# Patient Record
Sex: Male | Born: 1980 | Race: Asian | Hispanic: No | Marital: Single | Smoking: Never smoker
Health system: Southern US, Community
[De-identification: ages and names within clinical notes are randomized; demographics above are authoritative.]

## PROBLEM LIST (undated history)

## (undated) DIAGNOSIS — G629 Polyneuropathy, unspecified: Secondary | ICD-10-CM

## (undated) DIAGNOSIS — T7840XA Allergy, unspecified, initial encounter: Secondary | ICD-10-CM

## (undated) HISTORY — DX: Polyneuropathy, unspecified: G62.9

## (undated) HISTORY — DX: Allergy, unspecified, initial encounter: T78.40XA

---

## 2008-04-26 ENCOUNTER — Encounter: Payer: Self-pay | Admitting: Internal Medicine

## 2008-05-21 ENCOUNTER — Encounter: Payer: Self-pay | Admitting: Internal Medicine

## 2008-11-29 ENCOUNTER — Encounter: Payer: Self-pay | Admitting: Internal Medicine

## 2009-06-05 ENCOUNTER — Encounter: Payer: Self-pay | Admitting: Internal Medicine

## 2009-10-01 ENCOUNTER — Encounter: Payer: Self-pay | Admitting: Internal Medicine

## 2009-10-20 ENCOUNTER — Encounter: Payer: Self-pay | Admitting: Internal Medicine

## 2009-10-31 ENCOUNTER — Encounter: Payer: Self-pay | Admitting: Internal Medicine

## 2009-11-20 ENCOUNTER — Encounter: Payer: Self-pay | Admitting: Internal Medicine

## 2009-12-02 ENCOUNTER — Encounter: Payer: Self-pay | Admitting: Internal Medicine

## 2009-12-23 ENCOUNTER — Encounter: Payer: Self-pay | Admitting: Internal Medicine

## 2010-02-03 ENCOUNTER — Encounter: Payer: Self-pay | Admitting: Internal Medicine

## 2010-02-04 ENCOUNTER — Encounter: Payer: Self-pay | Admitting: Internal Medicine

## 2010-03-26 ENCOUNTER — Encounter: Payer: Self-pay | Admitting: Internal Medicine

## 2010-06-30 ENCOUNTER — Ambulatory Visit: Payer: Self-pay | Admitting: Internal Medicine

## 2010-06-30 DIAGNOSIS — K219 Gastro-esophageal reflux disease without esophagitis: Secondary | ICD-10-CM | POA: Insufficient documentation

## 2010-06-30 DIAGNOSIS — R059 Cough, unspecified: Secondary | ICD-10-CM | POA: Insufficient documentation

## 2010-06-30 DIAGNOSIS — R05 Cough: Secondary | ICD-10-CM

## 2010-07-21 ENCOUNTER — Telehealth: Payer: Self-pay | Admitting: Internal Medicine

## 2010-07-22 ENCOUNTER — Telehealth: Payer: Self-pay | Admitting: Internal Medicine

## 2010-07-22 ENCOUNTER — Ambulatory Visit (HOSPITAL_COMMUNITY): Admission: RE | Admit: 2010-07-22 | Payer: Self-pay | Source: Home / Self Care | Admitting: Internal Medicine

## 2010-08-13 ENCOUNTER — Ambulatory Visit: Admit: 2010-08-13 | Payer: Self-pay | Admitting: Internal Medicine

## 2010-08-25 NOTE — Letter (Signed)
Summary: [facility]  [facility]   Imported By: Lennie Odor 07/02/2010 11:06:59  _____________________________________________________________________  External Attachment:    Type:   Image     Comment:   External Document

## 2010-08-25 NOTE — Assessment & Plan Note (Signed)
Summary: PER DR MANN/CHRONIC COUGH/MH   Visit Type:  Initial Consult Copy to:  Dr. Charna Elizabeth Primary Provider/Referring Provider:  Dr. Charna Elizabeth  CC:  Pulmonary consult cough on and off x years..  History of Present Illness: 30 year old Indian-American man. Chronic cough. Severe. Dry. On and off since age 79 with a 4-5 year break in between. Stable all along but past 18 months or so progressively worse. Day time cough only. No nocturnal awakenings and family reports no cough at night. Cough triggered by smells, cooking smell, grease, frying food, cooking smoke and fluctuations in temeperature that happens quickly. Associated with cough is a mild cough that happens immediately after severe bouts of cough. This lasts a few seconds after cough.   Associated with cough is a feeling that throat gets dry afer talking 5 minutes. Needs to drink water to get relief. He feels throat gets red and closing in when he feels dry. Also happens when he coughs a lot.  Uses chloraseptic spray to numb throat in back and needs to sip a lot of water.  He also feels he has to clear throat all the time after talking or after coughing. Does admit to sinus drainage. Has seen Dr. Lucie Leather and DR Pollyann Kennedy for it. Diagnosed with ethmoid sinus and post nasal drainage. Given nasal inhaler nos 8 months ago but did not help. Denies associated dyspnea, hemoptysis, fever, chills, hypertension. Recollects normal spirometry at [facility] in Jan 2011. REcollects normal CXR at Dr. Pollyann Kennedy office this year  Rx for cough includes dulera past 3 months given by Dr. Lucie Leather but not helping. PRior to that was on ventolin ("yellow pump") as needed but did not help. REcollecs singulair trial for one month in summer2011 that helped "allergies" (pollen, trees, dust)  but not cough. Also recollects prilosc trial in 2010 for 6 weeks but did not help. Dr. Loreta Ave placed him on prevacid for a few weeks in fall 2011 but did not help. DR Loreta Ave also advised anti-gerd  diet which he states he is following (avoiding rice, white bread splenda, spices, oily food) and this has helped belching and diarrhea but not cough  Preventive Screening-Counseling & Management  Alcohol-Tobacco     Smoking Status: never  Current Medications (verified): 1)  Wal-Tussin 100 Mg/62ml Syrp (Guaifenesin) .... As Needed 2)  Theraflu Flu/chest Congestion 1000-400 Mg Pack (Acetaminophen-Guaifenesin) .... As Directed  Allergies (verified): No Known Drug Allergies  Past History:  Past Medical History: G E R D Allergy Skin Testing - april/may 2011  - skin testingpositve for pollen, grass, tress, pet, dus mite Sinusitis Chronic belching and diarrhea  Past Surgical History: none  Family History: Sister-Allergies (pet, weeds, grass, pollen, trees)   Social History: Patient never smoked.  Single Customer Service  Lives in [place] in [place] Migrated to [place] at age 72. Lives with mom and dad Graduate of business and computer scienceSmoking Status:  never  Review of Systems       The patient complains of non-productive cough, sore throat, and sneezing.  The patient denies shortness of breath with activity, shortness of breath at rest, productive cough, coughing up blood, chest pain, irregular heartbeats, acid heartburn, indigestion, loss of appetite, weight change, abdominal pain, difficulty swallowing, tooth/dental problems, headaches, nasal congestion/difficulty breathing through nose, itching, ear ache, anxiety, depression, hand/feet swelling, joint stiffness or pain, rash, change in color of mucus, and fever.    Vital Signs:  Patient profile:   30 year old male Height:  68 inches Weight:      161.13 pounds BMI:     24.59 O2 Sat:      98 % on Room air Temp:     97.6 degrees F oral Pulse rate:   85 / minute BP sitting:   110 / 78  (right arm) Cuff size:   regular  Vitals Entered By: Carron Curie CMA (June 30, 2010 3:38 PM)  O2 Flow:  Room  air CC: Pulmonary consult cough on and off x years. Comments Medications reviewed with patient Carron Curie CMA  June 30, 2010 3:41 PM Daytime phone number verified with patient.    Physical Exam  General:  periodic laryngal cough BARKING qUALITY THROAT CLEARING End EXP stridor after cough Head:  normocephalic and atraumatic Eyes:  PERRLA/EOM intact; conjunctiva and sclera clear Ears:  TMs intact and clear with normal canals Nose:  no deformity, discharge, inflammation, or lesions Mouth:  no deformity or lesions Neck:  no masses, thyromegaly, or abnormal cervical nodes Chest Wall:  no deformities noted Lungs:  clear bilaterally to auscultation and percussion Heart:  regular rate and rhythm, S1, S2 without murmurs, rubs, gallops, or clicks Abdomen:  bowel sounds positive; abdomen soft and non-tender without masses, or organomegaly Msk:  no deformity or scoliosis noted with normal posture Pulses:  pulses normal Extremities:  no clubbing, cyanosis, edema, or deformity noted Neurologic:  CN II-XII grossly intact with normal reflexes, coordination, muscle strength and tone Skin:  intact without lesions or rashes Cervical Nodes:  no significant adenopathy Axillary Nodes:  no significant adenopathy Psych:  alert and cooperative; normal mood and affect; normal attention span and concentration   Impression & Recommendations:  Problem # 1:  COUGH (ICD-786.2) Assessment New Chronic. Has failed anti-GERD and anti asthma, eosinophilic bronchitis Rx. Has hx of GERD and post nasa drainage as triggers. Cough itself appears chronic, cylical/habit cough that initially was broughon by sinus drainae/gerd but now has taken a life of its own. Many features of his cough fit in with that  plan get old spiro from [facility] get imaging results from Dr. 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 As needed 2)  Theraflu Flu/chest Congestion 1000-400 Mg Pack (Acetaminophen-guaifenesin) .... As directed  Patient Instructions: 1)  stop dulera and singulair if you are taking that too for 2-3 weeks 2)  have methacholine challenge test in 2-3 weeks 3)  use netti pot saline wash daily 4)  control acid reflux with diet - take diet sheet 5)  return after methacholine challenge test 6)  sign release to get spiro from [facility] and imaging from Dr Elray Buba

## 2010-08-25 NOTE — Letter (Signed)
Summary: [facility]  [facility]   Imported By: Lennie Odor 07/02/2010 11:09:16  _____________________________________________________________________  External Attachment:    Type:   Image     Comment:   External Document

## 2010-08-25 NOTE — Letter (Signed)
Summary: [facility]  [facility]   Imported By: Lennie Odor 07/02/2010 11:08:25  _____________________________________________________________________  External Attachment:    Type:   Image     Comment:   External Document

## 2010-08-27 NOTE — Letter (Signed)
Summary: [facility]   Imported By: Sherian Rein 07/23/2010 08:07:45  _____________________________________________________________________  External Attachment:    Type:   Image     Comment:   External Document

## 2010-08-27 NOTE — Letter (Signed)
Summary: [facility]   Imported By: Sherian Rein 07/23/2010 08:09:25  _____________________________________________________________________  External Attachment:    Type:   Image     Comment:   External Document

## 2010-08-27 NOTE — Letter (Signed)
Summary: [facility]   Imported By: Sherian Rein 07/23/2010 08:08:30  _____________________________________________________________________  External Attachment:    Type:   Image     Comment:   External Document

## 2010-08-27 NOTE — Letter (Signed)
Summary: [facility]   Imported By: Sherian Rein 07/23/2010 08:06:36  _____________________________________________________________________  External Attachment:    Type:   Image     Comment:   External Document

## 2010-08-27 NOTE — Progress Notes (Signed)
Summary: test tomorrow at [facility]  Phone Note Call from Patient Call back at [facility] Phone [phone]   Caller: Patient Call For: [facility] Summary of Call: Patient phoned stated that he wanted to confirm his a[[ointment at [facility] tomorrow. He also stated that he was suposed to do something for and he hasn't acutally done alot of it and wants to make sure it will not effect the out come of the test. He can be reached at [phone] Initial call taken by: Vedia Coffer,  July 21, 2010 3:56 PM  Follow-up for Phone Call        Spoke with pt and advised of appt time again. He states he has not done his netti pot as directed and wanted to know if this would effect the test. I advsied this would not effect the test.  Carron Curie CMA  July 21, 2010 4:20 PM

## 2010-08-27 NOTE — Letter (Signed)
Summary: [facility]   Imported By: Sherian Rein 07/23/2010 08:04:42  _____________________________________________________________________  External Attachment:    Type:   Image     Comment:   External Document

## 2010-08-27 NOTE — Progress Notes (Signed)
Summary: ? methacholine challenge---pt cancelled test-LMTCBx1  Phone Note Call from Patient Call back at Home Phone [phone]   Caller: Patient Call For: ramaswamy Reason for Call: Talk to Nurse Summary of Call: Patient requesting to speak to [facility] about methacholine challenge test.  Test was scheduled for today, however patient does not have insurance and cx test.  He wants to hold off rescheduling test until he speaks w/ Dr. Marchelle Gearing. Initial call taken by: Lehman Prom,  July 22, 2010 9:51 AM  Follow-up for Phone Call        Patient states he is without insurance at this time and was given an estimate of $900-$1200 for the methacholine challenge test from the hospital. He has cancelled this test d/t cost/no ins and would like to know what the next step is for treatment. Pt is aware MR is out of the office and msg will be forwarded for him to review once he is back. I will also forward to Victorino Dike so she is aware. Follow-up by: Michel Bickers CMA,  July 22, 2010 10:38 AM  Additional Follow-up for Phone Call Additional follow up Details #1::        lease give him fu appt. I will consider cyclical cough protocol and empiric neurontin Additional Follow-up by: Kalman Shan MD,  July 23, 2010 9:30 AM    Additional Follow-up for Phone Call Additional follow up Details #2::    LMTCBx1 to set appt.Carron Curie CMA  July 23, 2010 9:48 AM  Appointment scheduled Aug 13, 2010 @ 2:50pm. Zackery Barefoot CMA  July 23, 2010 10:33 AM

## 2010-08-27 NOTE — Letter (Signed)
Summary: [facility]  [facility]   Imported By: Sherian Rein 07/23/2010 08:03:24  _____________________________________________________________________  External Attachment:    Type:   Image     Comment:   External Document

## 2010-08-27 NOTE — Letter (Signed)
Summary: [facility]  [facility]   Imported By: Sherian Rein 07/23/2010 08:02:13  _____________________________________________________________________  External Attachment:    Type:   Image     Comment:   External Document

## 2010-08-27 NOTE — Letter (Signed)
Summary: [facility]   Imported By: Sherian Rein 07/23/2010 08:05:39  _____________________________________________________________________  External Attachment:    Type:   Image     Comment:   External Document

## 2011-11-29 ENCOUNTER — Ambulatory Visit (INDEPENDENT_AMBULATORY_CARE_PROVIDER_SITE_OTHER): Payer: Managed Care, Other (non HMO) | Admitting: Family Medicine

## 2011-11-29 ENCOUNTER — Ambulatory Visit: Payer: Managed Care, Other (non HMO)

## 2011-11-29 VITALS — BP 104/68 | HR 69 | Temp 98.0°F | Resp 16 | Ht 67.58 in | Wt 163.4 lb

## 2011-11-29 DIAGNOSIS — R059 Cough, unspecified: Secondary | ICD-10-CM

## 2011-11-29 DIAGNOSIS — J309 Allergic rhinitis, unspecified: Secondary | ICD-10-CM

## 2011-11-29 DIAGNOSIS — R05 Cough: Secondary | ICD-10-CM

## 2011-11-29 LAB — POCT CBC
Lymph, poc: 2.2 (ref 0.6–3.4)
MCHC: 33.3 g/dL (ref 31.8–35.4)
MPV: 10.7 fL (ref 0–99.8)
POC Granulocyte: 6.2 (ref 2–6.9)
POC LYMPH PERCENT: 23.9 %L (ref 10–50)
POC MID %: 7.7 %M (ref 0–12)
RDW, POC: 12.5 %

## 2011-11-29 MED ORDER — FLUTICASONE PROPIONATE 50 MCG/ACT NA SUSP: 2.0000 | Freq: Every day | NASAL | Status: DC

## 2011-11-29 MED ORDER — MONTELUKAST SODIUM 10 MG PO TABS: 10.0000 mg | ORAL_TABLET | Freq: Every day | ORAL | Status: DC

## 2011-11-29 NOTE — Progress Notes (Signed)
Patient Name: Joe Ray Date of Birth: 02-20-1981 Medical Record Number: [id number] Gender: male Date of Encounter: 11/29/2011  History of Present Illness:  Joe Ray is a 31 y.o. very pleasant male patient who presents with the following:  Here with a cough for 6 or 7 weeks which he attributed to allergies.  He then developed aches and fatigue about 2 weeks ago. No fever.  He went to another UC and was treated with Avelox and flovent about 6 days ago.  He seemed to be feeling better, but then the symptoms returned over the last couple of days.  He feels tired and run- down  He does have a ST off an on.  Also has headaches, body aches.  Runny and stuffy nose on and off, some sneezing.   The avelox caused diarrhea and nausea, but no vomiting.  He tends to have GI distress and diarrhea with most antibiotics  Patient Active Problem List  Diagnoses  . G E R D  . COUGH   No past medical history on file. No past surgical history on file. History  Substance Use Topics  . Smoking status: Never Smoker   . Smokeless tobacco: Not on file  . Alcohol Use: Not on file   No family history on file. Allergies  Allergen Reactions  . Amoxicillin Diarrhea and Rash    Medication list has been reviewed and updated.  Review of Systems: As per HPI- otherwise negative.   Physical Examination: Filed Vitals:   11/29/11 1510  BP: 104/68  Pulse: 69  Temp: 98 F (36.7 C)  TempSrc: Oral  Resp: 16  Height: 5' 7.58" (1.717 m)  Weight: 163 lb 6.4 oz (74.118 kg)    Body mass index is 25.15 kg/(m^2).  GEN: WDWN, NAD, Non-toxic, A & O x 3 HEENT: Atraumatic, Normocephalic. Neck supple. No masses, No LAD.  Tm, oropharynx wnl, nasal cavity wnl Ears and Nose: No external deformity. CV: RRR, No M/G/R. No JVD. No thrill. No extra heart sounds. PULM: CTA B, no wheezes, crackles, rhonchi. No retractions. No resp. distress. No accessory muscle use. ABD: S, NT, ND, +BS. No rebound. No HSM. EXTR: No  c/c/e NEURO Normal gait.  PSYCH: Normally interactive. Conversant. Not depressed or anxious appearing.  Calm demeanor.   [facility] reading (PRIMARY) by  Dr. Patsy Lager.  No infiltrate, but increased markings/ vascularity bilateral lungs  CHEST - 2 VIEW  Comparison: None.  Findings: The heart size is normal. Mild interstitial coarsening is evident. No focal airspace disease is present. Bifid fourth ribs are present bilaterally. The visualized soft tissues and bony thorax are otherwise unremarkable.  IMPRESSION:  1. No acute cardiopulmonary disease. 2. Mild interstitial coarsening is likely chronic.   Results for orders placed in visit on 11/29/11  POCT CBC      Component Value Range   WBC 9.0  4.6 - 10.2 (K/uL)   Lymph, poc 2.2  0.6 - 3.4    POC LYMPH PERCENT 23.9  10 - 50 (%L)   MID (cbc) 0.7  0 - 0.9    POC MID % 7.7  0 - 12 (%M)   POC Granulocyte 6.2  2 - 6.9    Granulocyte percent 68.4  37 - 80 (%G)   RBC 5.36  4.69 - 6.13 (M/uL)   Hemoglobin 15.4  14.1 - 18.1 (g/dL)   HCT, POC 40.9  81.1 - 53.7 (%)   MCV 86.2  80 - 97 (fL)   MCH, POC 28.7  27 - 31.2 (pg)   MCHC 33.3  31.8 - 35.4 (g/dL)   RDW, POC 16.1     Platelet Count, POC 302  142 - 424 (K/uL)   MPV 10.7  0 - 99.8 (fL)    Assessment and Plan: 1. Cough  POCT CBC, DG Chest 2 View  2. Allergic rhinitis  fluticasone (FLONASE) 50 MCG/ACT nasal spray, montelukast (SINGULAIR) 10 MG tablet   Joe Ray has had a cough and other allergy symptoms for some time.  Will add singulair (which he has used before) and flonase to his regimen.  He also wonders why he feels so tired lately.  Offered to do other labs such as TSH, CMP- however he prefers to see how he does with the steps above and will call me if he does not feel better. He will try and finish his last day of avelox, but will add kefir to help combat diarrhea.

## 2012-09-11 ENCOUNTER — Ambulatory Visit (INDEPENDENT_AMBULATORY_CARE_PROVIDER_SITE_OTHER): Payer: Managed Care, Other (non HMO)

## 2012-09-14 ENCOUNTER — Ambulatory Visit (INDEPENDENT_AMBULATORY_CARE_PROVIDER_SITE_OTHER): Payer: Managed Care, Other (non HMO) | Admitting: Family Medicine

## 2012-09-14 VITALS — BP 117/83 | HR 75 | Temp 97.9°F | Resp 16 | Ht 67.0 in | Wt 158.0 lb

## 2012-09-14 DIAGNOSIS — J45909 Unspecified asthma, uncomplicated: Secondary | ICD-10-CM

## 2012-09-14 DIAGNOSIS — R490 Dysphonia: Secondary | ICD-10-CM

## 2012-09-14 DIAGNOSIS — J309 Allergic rhinitis, unspecified: Secondary | ICD-10-CM

## 2012-09-14 MED ORDER — BECLOMETHASONE DIPROPIONATE 40 MCG/ACT IN AERS: 2.0000 | INHALATION_SPRAY | Freq: Two times a day (BID) | RESPIRATORY_TRACT | Status: DC

## 2012-09-14 MED ORDER — FLUTICASONE PROPIONATE 50 MCG/ACT NA SUSP: 2.0000 | Freq: Every day | NASAL | Status: DC

## 2012-09-14 NOTE — Patient Instructions (Addendum)
Start back on your flonase nasal spray.  Also, we are going to start you on qvar for your asthma- inhale one puff twice a day.  Let me know if you are not better in the next week or so.  I am also going to refer you to ENT for further evaluation

## 2012-09-14 NOTE — Progress Notes (Signed)
[facility] [address] [place] [address] [phone]  Date:  09/14/2012   Name:  Joe Ray   DOB:  08-Nov-1980   MRN:  [id number]  PCP:  No primary provider on file.    Chief Complaint: Cough and Sore Throat   History of Present Illness:  Joe Ray is a 32 y.o. very pleasant male patient who presents with the following:  He is here today with a lingering throat issues and cough.  He notes that his throat can feel tight or scratchy off an on, and he will have some bad cough attacks.  He also continues to have asthma symptoms and will wheeze after his throat feels worse.    He does note a hoarse voice which comes and goes.   He has some sneezing and runny nose as well No fever, chills or aches The cough is non- productive.   He uses albututerol prn, singunair and clarinex.   He has been using tylenol sinus OTC  He is using his albuterol nearly every day and is not on a controlled medication at this time  See note from Dr. Phylliss Bob (pulmonary) from 2011  He has had allergy testing in the past- he has general environmental allergies including leaves, pollen, grass, dog hair.  He does not want to do allergy shots at this time  Patient Active Problem List  Diagnosis  . Eugenie Filler COUGH    Past Medical History  Diagnosis Date  . Allergy     History reviewed. No pertinent past surgical history.  History  Substance Use Topics  . Smoking status: Never Smoker   . Smokeless tobacco: Not on file  . Alcohol Use: Not on file    Family History  Problem Relation Age of Onset  . Diabetes Father     Allergies  Allergen Reactions  . Amoxicillin Diarrhea and Rash    Medication list has been reviewed and updated.  Current Outpatient Prescriptions on File Prior to Visit  Medication Sig Dispense Refill  . Albuterol (PROVENTIL IN) Inhale 1 puff into the lungs as needed.      . montelukast (SINGULAIR) 10 MG tablet Take 1 tablet (10 mg total) by  mouth at bedtime.  30 tablet  3  . Phenyleph-Doxylamine-DM-APAP (TYLENOL COLD MULTI-SYMPTOM) 5-6.25-10-325 MG/15ML LIQD Take by mouth. Pt taking tylenol cold multi-symptom liquid qid  dose unknown      . fluticasone (FLONASE) 50 MCG/ACT nasal spray Place 2 sprays into the nose daily.  16 g  6  . fluticasone (FLOVENT HFA) 220 MCG/ACT inhaler Inhale 1 puff into the lungs 2 (two) times daily.      Marland Kitchen moxifloxacin (AVELOX) 400 MG tablet Take 400 mg by mouth daily.       No current facility-administered medications on file prior to visit.    Review of Systems:  As per HPI- otherwise negative.   Physical Examination: Filed Vitals:   09/14/12 1628  BP: 117/83  Pulse: 75  Temp: 97.9 F (36.6 C)  Resp: 16   Filed Vitals:   09/14/12 1628  Height: 5\' 7"  (1.702 m)  Weight: 158 lb (71.668 kg)   Body mass index is 24.74 kg/(m^2). Ideal Body Weight: Weight in (lb) to have BMI = 25: 159.3  GEN: WDWN, NAD, Non-toxic, A & O x 3 HEENT: Atraumatic, Normocephalic. Neck supple. No masses, No LAD.  Bilateral TM wnl, oropharynx normal.  PEERL,EOMI.   Nasal cavity  with stringy mucus- consistent with allergies Ears and Nose: No external deformity. CV: RRR, No M/G/R. No JVD. No thrill. No extra heart sounds. PULM: CTA B, no wheezes, crackles, rhonchi. No retractions. No resp. distress. No accessory muscle use. EXTR: No c/c/e NEURO Normal gait.  PSYCH: Normally interactive. Conversant. Not depressed or anxious appearing.  Calm demeanor.    Assessment and Plan: Allergic rhinitis - Plan: fluticasone (FLONASE) 50 MCG/ACT nasal spray  Asthma in adult - Plan: beclomethasone (QVAR) 40 MCG/ACT inhaler  Hoarse voice quality - Plan: Ambulatory referral to ENT  uncontrolled asthma- will add qvar to his regimen- asked him to start with just 1 puff of the 40 BID.  Continue singulair and albuterol as needed.  Add flonase for his allergies.  He requests an ENT evaluation- will refer  Abbe Amsterdam,  MD

## 2013-09-03 ENCOUNTER — Ambulatory Visit (INDEPENDENT_AMBULATORY_CARE_PROVIDER_SITE_OTHER): Payer: Managed Care, Other (non HMO) | Admitting: Family Medicine

## 2013-09-03 VITALS — BP 110/72 | HR 85 | Temp 97.2°F | Resp 18 | Ht 68.0 in | Wt 166.0 lb

## 2013-09-03 DIAGNOSIS — R059 Cough, unspecified: Secondary | ICD-10-CM

## 2013-09-03 DIAGNOSIS — J019 Acute sinusitis, unspecified: Secondary | ICD-10-CM

## 2013-09-03 DIAGNOSIS — R509 Fever, unspecified: Secondary | ICD-10-CM

## 2013-09-03 DIAGNOSIS — R05 Cough: Secondary | ICD-10-CM

## 2013-09-03 LAB — POCT CBC
Granulocyte percent: 63.1 %G (ref 37–80)
HEMATOCRIT: 47.4 % (ref 43.5–53.7)
Hemoglobin: 14.9 g/dL (ref 14.1–18.1)
LYMPH, POC: 1.7 (ref 0.6–3.4)
MCH: 28.4 pg (ref 27–31.2)
MCHC: 31.4 g/dL — AB (ref 31.8–35.4)
MCV: 90.2 fL (ref 80–97)
MID (CBC): 0.5 (ref 0–0.9)
MPV: 11 fL (ref 0–99.8)
PLATELET COUNT, POC: 235 10*3/uL (ref 142–424)
POC Granulocyte: 3.8 (ref 2–6.9)
POC LYMPH %: 27.8 % (ref 10–50)
POC MID %: 9.1 %M (ref 0–12)
RBC: 5.25 M/uL (ref 4.69–6.13)
RDW, POC: 13.7 %
WBC: 6 10*3/uL (ref 4.6–10.2)

## 2013-09-03 LAB — POCT INFLUENZA A/B
INFLUENZA A, POC: NEGATIVE
INFLUENZA B, POC: NEGATIVE

## 2013-09-03 MED ORDER — LEVOFLOXACIN 500 MG PO TABS: 500.0000 mg | ORAL_TABLET | Freq: Every day | ORAL | Status: DC

## 2013-09-03 NOTE — Patient Instructions (Signed)
Try some mucinex, and pain relievers OTC for your sinus congestion/ sinus infection Take the levaquin antibiotic once a day for 7 days.    Let me know if you are not better soon!   Try some aquaphor ointment for your hands- use at night before bed.  For added benefit add gloves at night.

## 2013-09-03 NOTE — Progress Notes (Signed)
[facility] [address] [place] [address] [phone]  Date:  09/03/2013   Name:  Joe Ray   DOB:  09/25/1980   MRN:  [id number]  PCP:  No primary provider on file.    Chief Complaint: Nasal Congestion, Sore Throat, Chills, Cough and Headache   History of Present Illness:  Joe Ray is a 33 y.o. very pleasant male patient who presents with the following:  He has been ill for 4 or 5 days with cough, runny nose, body aches, chills, fatigue.  He has not measured a fever at home.  He feels pressure in the left side of his sinuses.   He has taken ibuprofen, theraflu today.   No GI symptoms.   He has been able to drive, but has been in bed for the last few days.  His sx seem to be getting worse.  He is "miserable" and the worst sx is the pain in his sinuses  He is otherwise generally healthy except for GERD  Patient Active Problem List   Diagnosis Date Noted  . G E R D 06/30/2010  . COUGH 06/30/2010    Past Medical History  Diagnosis Date  . Allergy     History reviewed. No pertinent past surgical history.  History  Substance Use Topics  . Smoking status: Never Smoker   . Smokeless tobacco: Not on file  . Alcohol Use: Not on file    Family History  Problem Relation Age of Onset  . Diabetes Father     Allergies  Allergen Reactions  . Amoxicillin Diarrhea and Rash    Medication list has been reviewed and updated.  Current Outpatient Prescriptions on File Prior to Visit  Medication Sig Dispense Refill  . Iron-Vit C-Vit B12-Folic Acid (IRON 100 PLUS PO) Take by mouth.      . moxifloxacin (AVELOX) 400 MG tablet Take 400 mg by mouth daily.      Marland Kitchen Phenyleph-Doxylamine-DM-APAP (TYLENOL COLD MULTI-SYMPTOM) 5-6.25-10-325 MG/15ML LIQD Take by mouth. Pt taking tylenol cold multi-symptom liquid qid  dose unknown      . Albuterol (PROVENTIL IN) Inhale 1 puff into the lungs as needed.      . beclomethasone (QVAR) 40 MCG/ACT inhaler Inhale 2  puffs into the lungs 2 (two) times daily.  1 Inhaler  12  . fluticasone (FLONASE) 50 MCG/ACT nasal spray Place 2 sprays into the nose daily.  16 g  6  . montelukast (SINGULAIR) 10 MG tablet Take 1 tablet (10 mg total) by mouth at bedtime.  30 tablet  3   No current facility-administered medications on file prior to visit.    Review of Systems:  As per HPI- otherwise negative.   Physical Examination: Filed Vitals:   09/03/13 1514  BP: 110/72  Pulse: 85  Temp: 97.2 F (36.2 C)  Resp: 18   Filed Vitals:   09/03/13 1514  Height: 5\' 8"  (1.727 m)  Weight: 166 lb (75.297 kg)   Body mass index is 25.25 kg/(m^2). Ideal Body Weight: Weight in (lb) to have BMI = 25: 164.1  GEN: WDWN, NAD, Non-toxic, A & O x 3 HEENT: Atraumatic, Normocephalic. Neck supple. No masses, No LAD.  Bilateral TM wnl, oropharynx normal.  PEERL,EOMI.   Tender and congested in the left sinuses.  Nasal cavity congested Ears and Nose: No external deformity. CV: RRR, No M/G/R. No JVD. No thrill. No extra heart sounds. PULM: CTA B, no wheezes, crackles, rhonchi. No retractions.  No resp. distress. No accessory muscle use. ABD: S, NT, ND, +BS. No rebound. No HSM. EXTR: No c/c/e NEURO Normal gait.  PSYCH: Normally interactive. Conversant. Not depressed or anxious appearing.  Calm demeanor.   Results for orders placed in visit on 09/03/13  POCT INFLUENZA A/B      Result Value Range   Influenza A, POC Negative     Influenza B, POC Negative    POCT CBC      Result Value Range   WBC 6.0  4.6 - 10.2 K/uL   Lymph, poc 1.7  0.6 - 3.4   POC LYMPH PERCENT 27.8  10 - 50 %L   MID (cbc) 0.5  0 - 0.9   POC MID % 9.1  0 - 12 %M   POC Granulocyte 3.8  2 - 6.9   Granulocyte percent 63.1  37 - 80 %G   RBC 5.25  4.69 - 6.13 M/uL   Hemoglobin 14.9  14.1 - 18.1 g/dL   HCT, POC 16.147.4  09.643.5 - 53.7 %   MCV 90.2  80 - 97 fL   MCH, POC 28.4  27 - 31.2 pg   MCHC 31.4 (*) 31.8 - 35.4 g/dL   RDW, POC 04.513.7     Platelet Count, POC  235  142 - 424 K/uL   MPV 11.0  0 - 99.8 fL    Assessment and Plan: Acute sinusitis - Plan: levofloxacin (LEVAQUIN) 500 MG tablet  Fever, unspecified - Plan: POCT Influenza A/B, POCT CBC  Cough  Treat for sinusitis with levaquin as he is allergic to amox.  Follow up if not better soon- Sooner if worse.     Signed Abbe AmsterdamJessica Copland, MD

## 2014-11-11 ENCOUNTER — Ambulatory Visit (INDEPENDENT_AMBULATORY_CARE_PROVIDER_SITE_OTHER): Payer: Managed Care, Other (non HMO) | Admitting: Family Medicine

## 2014-11-11 VITALS — BP 118/64 | HR 82 | Temp 97.9°F | Resp 16 | Ht 69.0 in | Wt 154.8 lb

## 2014-11-11 DIAGNOSIS — L03211 Cellulitis of face: Secondary | ICD-10-CM

## 2014-11-11 DIAGNOSIS — R51 Headache: Secondary | ICD-10-CM

## 2014-11-11 DIAGNOSIS — L0201 Cutaneous abscess of face: Secondary | ICD-10-CM | POA: Diagnosis not present

## 2014-11-11 DIAGNOSIS — R519 Headache, unspecified: Secondary | ICD-10-CM

## 2014-11-11 MED ORDER — OXYCODONE-ACETAMINOPHEN 5-325 MG PO TABS: 1.0000 | ORAL_TABLET | Freq: Three times a day (TID) | ORAL | Status: DC | PRN

## 2014-11-11 MED ORDER — SULFAMETHOXAZOLE-TRIMETHOPRIM 800-160 MG PO TABS: 1.0000 | ORAL_TABLET | Freq: Two times a day (BID) | ORAL | Status: DC

## 2014-11-11 NOTE — Patient Instructions (Addendum)
Abscess °Care After °An abscess (also called a boil or furuncle) is an infected area that contains a collection of pus. Signs and symptoms of an abscess include pain, tenderness, redness, or hardness, or you may feel a moveable soft area under your skin. An abscess can occur anywhere in the body. The infection may spread to surrounding tissues causing cellulitis. A cut (incision) by the surgeon was made over your abscess and the pus was drained out. Gauze may have been packed into the space to provide a drain that will allow the cavity to heal from the inside outwards. The boil may be painful for 5 to 7 days. Most people with a boil do not have high fevers. Your abscess, if seen early, may not have localized, and may not have been lanced. If not, another appointment may be required for this if it does not get better on its own or with medications. °HOME CARE INSTRUCTIONS  °· Only take over-the-counter or prescription medicines for pain, discomfort, or fever as directed by your caregiver. °· When you bathe, soak and then remove gauze or iodoform packs at least daily or as directed by your caregiver. You may then wash the wound gently with mild soapy water. Repack with gauze or do as your caregiver directs. °SEEK IMMEDIATE MEDICAL CARE IF:  °· You develop increased pain, swelling, redness, drainage, or bleeding in the wound site. °· You develop signs of generalized infection including muscle aches, chills, fever, or a general ill feeling. °· An oral temperature above 102° F (38.9° C) develops, not controlled by medication. °See your caregiver for a recheck if you develop any of the symptoms described above. If medications (antibiotics) were prescribed, take them as directed. °Document Released: 01/28/2005 Document Revised: 10/04/2011 Document Reviewed: 09/25/2007 °ExitCare® Patient Information ©2015 [employer]. This information is not intended to replace advice given to you by your health care provider. Make sure  you discuss any questions you have with your health care provider. ° °Abscess °An abscess is an infected area that contains a collection of pus and debris. It can occur in almost any part of the body. An abscess is also known as a furuncle or boil. °CAUSES  °An abscess occurs when tissue gets infected. This can occur from blockage of oil or sweat glands, infection of hair follicles, or a minor injury to the skin. As the body tries to fight the infection, pus collects in the area and creates pressure under the skin. This pressure causes pain. People with weakened immune systems have difficulty fighting infections and get certain abscesses more often.  °SYMPTOMS °Usually an abscess develops on the skin and becomes a painful mass that is red, warm, and tender. If the abscess forms under the skin, you may feel a moveable soft area under the skin. Some abscesses break open (rupture) on their own, but most will continue to get worse without care. The infection can spread deeper into the body and eventually into the bloodstream, causing you to feel ill.  °DIAGNOSIS  °Your caregiver will take your medical history and perform a physical exam. A sample of fluid may also be taken from the abscess to determine what is causing your infection. °TREATMENT  °Your caregiver may prescribe antibiotic medicines to fight the infection. However, taking antibiotics alone usually does not cure an abscess. Your caregiver may need to make a small cut (incision) in the abscess to drain the pus. In some cases, gauze is packed into the abscess to reduce pain and to   continue draining the area. °HOME CARE INSTRUCTIONS  °· Only take over-the-counter or prescription medicines for pain, discomfort, or fever as directed by your caregiver. °· If you were prescribed antibiotics, take them as directed. Finish them even if you start to feel better. °· If gauze is used, follow your caregiver's directions for changing the gauze. °· To avoid spreading the  infection: °¨ Keep your draining abscess covered with a bandage. °¨ Wash your hands well. °¨ Do not share personal care items, towels, or whirlpools with others. °¨ Avoid skin contact with others. °· Keep your skin and clothes clean around the abscess. °· Keep all follow-up appointments as directed by your caregiver. °SEEK MEDICAL CARE IF:  °· You have increased pain, swelling, redness, fluid drainage, or bleeding. °· You have muscle aches, chills, or a general ill feeling. °· You have a fever. °MAKE SURE YOU:  °· Understand these instructions. °· Will watch your condition. °· Will get help right away if you are not doing well or get worse. °Document Released: 04/21/2005 Document Revised: 01/11/2012 Document Reviewed: 09/24/2011 °ExitCare® Patient Information ©2015 [employer]. This information is not intended to replace advice given to you by your health care provider. Make sure you discuss any questions you have with your health care provider. ° °

## 2014-11-11 NOTE — Progress Notes (Signed)
PROCEDURE NOTE: I&D of Abscess Verbal consent obtained. Local anesthesia with 2cc of 2% lidocaine with epinephrine. Site cleansed with Betadine.  Incision of 1cm was made using a 11 blade, discharge of copious amounts of pus and serosanguinous fluid. Wound cavity was explored with curved hemostats and aggressively packed with 1/4" plain packing. Cleansed and dressed. After care instructions provided. Patient to return to clinic on 11/13/2014 for reevaluation/repacking.  Wallis BambergMario Azariel Banik, PA-C [facility] [phone] 11/11/2014  6:52 PM

## 2014-11-11 NOTE — Progress Notes (Signed)
Subjective:   This chart was scribed for Dr. Norberto Sorenson, MD by Jarvis Morgan, ED Scribe. This patient was seen in Room 2 and the patient's care was started at 5:07 PM.   Patient ID: Joe Ray, male    DOB: [birth year], 34 y.o.   MRN: [id number]  Chief Complaint  Patient presents with  . Cyst    face    HPI HPI Comments: Joe Ray is a 34 y.o. male who [facility] complaining of a cyst to the right submandibular area. He is complaining of an associated sore throat but states that could be more so due to allergies since he does suffer from seasonal allergies Pt denies any drainage from the area. He has applied peroxide washes to the area with no relief. Pt has also applied a bandage to the area to keep objects from coming in contact with it and exacerbating the pain. Pt denies any issues with cysts or abscesses in the past. He denies any fever, chills, nausea, emesis or trouble swallowing   Past Medical History  Diagnosis Date  . Allergy    Current Outpatient Prescriptions on File Prior to Visit  Medication Sig Dispense Refill  . Iron-Vit C-Vit B12-Folic Acid (IRON 100 PLUS PO) Take by mouth.    . montelukast (SINGULAIR) 10 MG tablet Take 1 tablet (10 mg total) by mouth at bedtime. 30 tablet 3   No current facility-administered medications on file prior to visit.   Allergies  Allergen Reactions  . Amoxicillin Diarrhea and Rash     Review of Systems  Constitutional: Negative for fever, chills, diaphoresis, activity change, appetite change, fatigue and unexpected weight change.  HENT: Negative for congestion, dental problem, ear discharge, ear pain, facial swelling, hearing loss, mouth sores, nosebleeds, postnasal drip, rhinorrhea, sinus pressure, sore throat, trouble swallowing and voice change.   Gastrointestinal: Negative for nausea and vomiting.  Skin: Positive for color change (cyst to right submandibular area of face) and wound.   Cyst to right submandibular area  Neurological: Positive for facial asymmetry. Negative for dizziness, light-headedness and headaches.  Hematological: Negative for adenopathy. Does not bruise/bleed easily.  Psychiatric/Behavioral: The patient is nervous/anxious.      Triage Vitals: BP 118/64 mmHg  Pulse 82  Temp(Src) 97.9 F (36.6 C) (Oral)  Resp 16  Ht  (1.753 m)  Wt 154 lb 12.8 oz (70.217 kg)  BMI 22.85 kg/m2  SpO2 100%  Objective:   Physical Exam  Constitutional: He is oriented to person, place, and time. He appears well-developed and well-nourished. No distress.  HENT:  Head: Normocephalic and atraumatic.  Mouth/Throat: Uvula is midline and oropharynx is clear and moist. No oropharyngeal exudate, posterior oropharyngeal edema or posterior oropharyngeal erythema.  Tenderness to angle of mandible. Normal soft palate  Eyes: Conjunctivae and EOM are normal.  Neck: Neck supple. No tracheal deviation present. No thyroid mass and no thyromegaly present.  Cardiovascular: Normal rate.   Pulmonary/Chest: Effort normal. No respiratory distress.  Musculoskeletal: Normal range of motion.  Lymphadenopathy:       Head (right side): No preauricular and no posterior auricular adenopathy present.       Head (left side): No preauricular and no posterior auricular adenopathy present.    He has no cervical adenopathy.       Right cervical: No posterior cervical adenopathy present.      Left cervical: No posterior cervical adenopathy present.  Neurological: He is alert and oriented  to person, place, and time.  Skin: Skin is warm and dry.  Large submandibular abscess vs subcutaneous cyst. 3-4 cm firm, well-demarcated, mobile 6 cm  induration with central 2 cm flunctuance  Psychiatric: He has a normal mood and affect. His behavior is normal.  Nursing note and vitals reviewed.      Assessment & Plan:   1. Cellulitis and abscess of face   2. Facial pain   recheck in 24-48 hrs, reviewed  freq moist heat if wic is accedently removed and rtc asap.  Severe gerd daily so will try on bactrim.  Orders Placed This Encounter  Procedures  . Wound culture    Meds ordered this encounter  Medications  . desloratadine (CLARINEX) 5 MG tablet    Sig: Take 5 mg by mouth daily.  Marland Kitchen. sulfamethoxazole-trimethoprim (BACTRIM DS,SEPTRA DS) 800-160 MG per tablet    Sig: Take 1 tablet by mouth 2 (two) times daily.    Dispense:  28 tablet    Refill:  0  . oxyCODONE-acetaminophen (ROXICET) 5-325 MG per tablet    Sig: Take 1 tablet by mouth every 8 (eight) hours as needed for severe pain.    Dispense:  15 tablet    Refill:  0    Order Specific Question:  Supervising Provider    Answer:  Clelia CroftSHAW, Zariel Capano N [[id number]]    I personally performed the services described in this documentation, which was scribed in my presence. The recorded information has been reviewed and considered, and addended by me as needed.  Norberto SorensonEva Evoleht Hovatter, MD MPH

## 2014-11-13 ENCOUNTER — Ambulatory Visit (INDEPENDENT_AMBULATORY_CARE_PROVIDER_SITE_OTHER): Payer: Managed Care, Other (non HMO) | Admitting: Physician Assistant

## 2014-11-13 VITALS — BP 92/70 | HR 76 | Temp 97.7°F | Resp 16 | Ht 69.0 in | Wt 155.0 lb

## 2014-11-13 DIAGNOSIS — L0201 Cutaneous abscess of face: Secondary | ICD-10-CM

## 2014-11-13 DIAGNOSIS — L03211 Cellulitis of face: Secondary | ICD-10-CM

## 2014-11-13 MED ORDER — DOXYCYCLINE HYCLATE 100 MG PO CAPS: 100.0000 mg | ORAL_CAPSULE | Freq: Two times a day (BID) | ORAL | Status: DC

## 2014-11-14 ENCOUNTER — Telehealth: Payer: Self-pay | Admitting: Physician Assistant

## 2014-11-14 LAB — WOUND CULTURE: Gram Stain: NONE SEEN

## 2014-11-14 NOTE — Progress Notes (Signed)
   11/14/2014 at 9:45 AM  Joe BeersNeel K Ray / DOB: [birth year] / MRN: [id number]  The patient has G E R D and COUGH on his problem list.  SUBJECTIVE  Chief complaint: Wound Check  Joe Ray is a well appearing 34 y.o. here today for wound care. He reports exquisite tenderness at the site of the wound, and denies nausea, emesis, fever and chills.  He has been compliant with medical therapy and recommendations thus far.   He  has a past medical history of Allergy.    Medications reviewed and updated by myself where necessary, and exist elsewhere in the encounter.   Joe Ray is allergic to amoxicillin. He  reports that he has never smoked. He does not have any smokeless tobacco history on file. He  has no sexual activity history on file. The patient  has no past surgical history on file.  His family history includes Diabetes in his father.  ROS  Per HPI  OBJECTIVE  His  height is 5\' 9"  (1.753 m) and weight is 155 lb (70.308 kg). His oral temperature is 97.7 F (36.5 C). His blood pressure is 92/70 and his pulse is 76. His respiration is 16 and oxygen saturation is 98%.  The patient's body mass index is 22.88 kg/(m^2).  Physical Exam  Vitals reviewed. Constitutional: He appears well-developed.  HENT:  Head:    Cardiovascular: Normal rate.   Skin: Skin is warm and dry. He is not diaphoretic.  Psychiatric: His mood appears anxious.  Patient appears to have tactile hypersensitivity with respect to the wound, despite adequate local anesthesia.     No results found for this or any previous visit (from the past 24 hour(s)).  ASSESSMENT & PLAN  Joe Ray was seen today for wound check.  Diagnoses and all orders for this visit:  Cellulitis and abscess of face: Wound investigated for loculations given level of sensitivity, though none could be found.  Adding doxy to TMP/SMX for further coverage of MRSA, despite excellent coverage of the latter agent.  Will stop one of the two agents once culture  and sensitivities are finalized. Patient to return in 48 hours for wound care.  Orders: -     doxycycline (VIBRAMYCIN) 100 MG capsule; Take 1 capsule (100 mg total) by mouth 2 (two) times daily.    The patient was advised to call or come back to clinic if he does not see an improvement in symptoms, or worsens with the above plan.   Deliah BostonMichael Emila Steinhauser, MHS, PA-C [facility] 11/14/2014 9:45 AM

## 2014-11-14 NOTE — Telephone Encounter (Signed)
Left message regarding culture results.  Advised he stop the Bactrim given resistance and continue doxycycline given sensitivity. Deliah BostonMichael Naya Ilagan, MS, PA-C 9:06 PM, 11/14/2014

## 2014-11-15 ENCOUNTER — Ambulatory Visit (INDEPENDENT_AMBULATORY_CARE_PROVIDER_SITE_OTHER): Payer: Managed Care, Other (non HMO) | Admitting: Physician Assistant

## 2014-11-15 VITALS — BP 98/60 | HR 70 | Temp 97.7°F | Resp 18 | Ht 69.0 in | Wt 154.0 lb

## 2014-11-15 DIAGNOSIS — L0201 Cutaneous abscess of face: Secondary | ICD-10-CM

## 2014-11-15 DIAGNOSIS — L03211 Cellulitis of face: Secondary | ICD-10-CM

## 2014-11-16 NOTE — Progress Notes (Signed)
   11/16/2014 at 8:19 AM  Dinah BeersNeel K Coltrin / DOB: 1981/07/02 / MRN: [id number]  The patient has G E R D and COUGH on his problem list.  SUBJECTIVE  Chief complaint: Wound Check  Mr. Sherryll BurgerShah is a well appearing 34 y.o. here today for wound care. He pain has decreased since starting the doxycycline.  He has stopped TMP/SMX per medical advise. Denies constitutional symptoms, nausea and vomiting.    He  has a past medical history of Allergy.    Medications reviewed and updated by myself where necessary, and exist elsewhere in the encounter.   Mr. Sherryll BurgerShah is allergic to amoxicillin. He  reports that he has never smoked. He does not have any smokeless tobacco history on file. He  has no sexual activity history on file. The patient  has no past surgical history on file.  His family history includes Diabetes in his father.  Review of Systems  Constitutional: Negative for fever and chills.    Per HPI  OBJECTIVE  His  height is 5\' 9"  (1.753 m) and weight is 154 lb (69.854 kg). His oral temperature is 97.7 F (36.5 C). His blood pressure is 98/60 and his pulse is 70. His respiration is 18 and oxygen saturation is 99%.  The patient's body mass index is 22.73 kg/(m^2).  Physical Exam  Vitals reviewed. Constitutional: He appears well-developed.  HENT:  Head:    Cardiovascular: Normal rate.   Skin: Skin is warm and dry. He is not diaphoretic.  Psychiatric: His mood appears anxious.       No results found for this or any previous visit (from the past 24 hour(s)).  ASSESSMENT & PLAN  Lysle Moraleseel was seen today for wound check.  Diagnoses and all orders for this visit:  Cellulitis and abscess of face: Wound progressing well. Patient to continue doxy.Return in 48 hours for wound care.      The patient was advised to call or come back to clinic if he does not see an improvement in symptoms, or worsens with the above plan.   Deliah BostonMichael Aashrith Eves, MHS, PA-C [facility] 11/16/2014 8:19 AM

## 2014-11-17 ENCOUNTER — Ambulatory Visit (INDEPENDENT_AMBULATORY_CARE_PROVIDER_SITE_OTHER): Payer: Managed Care, Other (non HMO) | Admitting: Family Medicine

## 2014-11-17 VITALS — BP 98/66 | HR 76 | Temp 97.2°F | Resp 16

## 2014-11-17 DIAGNOSIS — L0201 Cutaneous abscess of face: Secondary | ICD-10-CM

## 2014-11-17 DIAGNOSIS — L03211 Cellulitis of face: Secondary | ICD-10-CM

## 2014-11-17 MED ORDER — DOXYCYCLINE HYCLATE 100 MG PO CAPS: 100.0000 mg | ORAL_CAPSULE | Freq: Two times a day (BID) | ORAL | Status: DC

## 2014-11-17 NOTE — Patient Instructions (Signed)
Recheck Thursday    Abscess An abscess is an infected area that contains a collection of pus and debris.It can occur in almost any part of the body. An abscess is also known as a furuncle or boil. CAUSES  An abscess occurs when tissue gets infected. This can occur from blockage of oil or sweat glands, infection of hair follicles, or a minor injury to the skin. As the body tries to fight the infection, pus collects in the area and creates pressure under the skin. This pressure causes pain. People with weakened immune systems have difficulty fighting infections and get certain abscesses more often.  SYMPTOMS Usually an abscess develops on the skin and becomes a painful mass that is red, warm, and tender. If the abscess forms under the skin, you may feel a moveable soft area under the skin. Some abscesses break open (rupture) on their own, but most will continue to get worse without care. The infection can spread deeper into the body and eventually into the bloodstream, causing you to feel ill.  DIAGNOSIS  Your caregiver will take your medical history and perform a physical exam. A sample of fluid may also be taken from the abscess to determine what is causing your infection. TREATMENT  Your caregiver may prescribe antibiotic medicines to fight the infection. However, taking antibiotics alone usually does not cure an abscess. Your caregiver may need to make a small cut (incision) in the abscess to drain the pus. In some cases, gauze is packed into the abscess to reduce pain and to continue draining the area. HOME CARE INSTRUCTIONS   Only take over-the-counter or prescription medicines for pain, discomfort, or fever as directed by your caregiver.  If you were prescribed antibiotics, take them as directed. Finish them even if you start to feel better.  If gauze is used, follow your caregiver's directions for changing the gauze.  To avoid spreading the infection:  Keep your draining abscess  covered with a bandage.  Wash your hands well.  Do not share personal care items, towels, or whirlpools with others.  Avoid skin contact with others.  Keep your skin and clothes clean around the abscess.  Keep all follow-up appointments as directed by your caregiver. SEEK MEDICAL CARE IF:   You have increased pain, swelling, redness, fluid drainage, or bleeding.  You have muscle aches, chills, or a general ill feeling.  You have a fever. MAKE SURE YOU:   Understand these instructions.  Will watch your condition.  Will get help right away if you are not doing well or get worse. Document Released: 04/21/2005 Document Revised: 01/11/2012 Document Reviewed: 09/24/2011 [facility] [place] Patient Information 2015 [place], [place]. This information is not intended to replace advice given to you by your health care provider. Make sure you discuss any questions you have with your health care provider.

## 2014-11-17 NOTE — Progress Notes (Signed)
Patient ID: Joe Ray, male   DOB: [birth year], 34 y.o.   MRN: [id number]   This chart was scribed for Elvina SidleKurt Lauenstein, MD by [facility], medical scribe at [facility].The patient was seen in exam room 14 and the patient's care was started at 1:20 PM.  Patient ID: Joe Ray MRN: [id number], DOB: [birth year], 34 y.o. Date of Encounter: 11/17/2014  Primary Physician: No primary care provider on file.  Chief Complaint:  Chief Complaint  Patient presents with   Wound Check   HPI:  Joe Ray is a 34 y.o. male who presents to [facility] here for a wound check. His pain has decreased since starting antibiotics. He was seen on 11/11/2014 for an abscess and treated with doxycyline and given hydrocodone. He works on the computer, no heavy lifting.  Past Medical History  Diagnosis Date   Allergy     Home Meds: Prior to Admission medications   Medication Sig Start Date End Date Taking? Authorizing Provider  desloratadine (CLARINEX) 5 MG tablet Take 5 mg by mouth daily.   Yes Historical Provider, MD  doxycycline (VIBRAMYCIN) 100 MG capsule Take 1 capsule (100 mg total) by mouth 2 (two) times daily. 11/13/14  Yes Ofilia NeasMichael L Clark, PA-C  Iron-Vit C-Vit B12-Folic Acid (IRON 100 PLUS PO) Take by mouth.   Yes Historical Provider, MD  oxyCODONE-acetaminophen (ROXICET) 5-325 MG per tablet Take 1 tablet by mouth every 8 (eight) hours as needed for severe pain. 11/11/14  Yes Wallis BambergMario Mani, PA-C  sulfamethoxazole-trimethoprim (BACTRIM DS,SEPTRA DS) 800-160 MG per tablet Take 1 tablet by mouth 2 (two) times daily. 11/11/14  Yes Sherren MochaEva N Shaw, MD  montelukast (SINGULAIR) 10 MG tablet Take 1 tablet (10 mg total) by mouth at bedtime. 11/29/11 11/15/14  Pearline CablesJessica C Copland, MD   Allergies:  Allergies  Allergen Reactions   Amoxicillin Diarrhea and Rash   History   Social History   Marital Status: Single    Spouse Name: N/A   Number of Children: N/A   Years of Education:  N/A   Occupational History   Not on file.   Social History Main Topics   Smoking status: Never Smoker    Smokeless tobacco: Not on file   Alcohol Use: Not on file   Drug Use: Not on file   Sexual Activity: Not on file   Other Topics Concern   Not on file   Social History Narrative    Review of Systems: Constitutional: negative for chills, fever, night sweats, weight changes, or fatigue  HEENT: negative for vision changes, hearing loss, congestion, rhinorrhea, ST, epistaxis, or sinus pressure Cardiovascular: negative for chest pain or palpitations Respiratory: negative for hemoptysis, wheezing, shortness of breath, or cough Abdominal: negative for abdominal pain, nausea, vomiting, diarrhea, or constipation Dermatological: negative for rash Neurologic: negative for headache, dizziness, or syncope All other systems reviewed and are otherwise negative with the exception to those above and in the HPI.  Physical Exam: Blood pressure 98/66, pulse 76, temperature 97.2 F (36.2 C), resp. rate 16, SpO2 100 %., There is no weight on file to calculate BMI. General: Well developed, well nourished, in no acute distress. Head: Normocephalic, atraumatic, eyes without discharge, sclera non-icteric, nares are without discharge. Bilateral auditory canals clear, TM's are without perforation, pearly grey and translucent with reflective cone of light bilaterally. Oral cavity moist, posterior pharynx without exudate, erythema, peritonsillar abscess, or post nasal drip.  Neck: Supple. No thyromegaly. Full  ROM. No lymphadenopathy. Lungs: Clear bilaterally to auscultation without wheezes, rales, or rhonchi. Breathing is unlabored. Heart: RRR with S1 S2. No murmurs, rubs, or gallops appreciated. Abdomen: Soft, non-tender, non-distended with normoactive bowel sounds. No hepatomegaly. No rebound/guarding. No obvious abdominal masses. Msk:  Strength and tone normal for age. Extremities/Skin: Warm and  dry. No clubbing or cyanosis. No edema. No rashes or suspicious lesions. A 1/2 cm open surgical wound angled to the jaw. 4-5 cm of induration and a clean base once the packing was removed. Neuro: Alert and oriented X 3. Moves all extremities spontaneously. Gait is normal. CNII-XII grossly in tact. Psych:  Responds to questions appropriately with a normal affect.   Labs: Results for orders placed or performed in visit on 11/11/14  Wound culture  Result Value Ref Range   Culture      Abundant METHICILLIN RESISTANT STAPHYLOCOCCUS AUREUS   Gram Stain Abundant    Gram Stain WBC present-predominately PMN    Gram Stain No Squamous Epithelial Cells Seen    Gram Stain Few Gram Positive Cocci In Pairs    Organism ID, Bacteria METHICILLIN RESISTANT STAPHYLOCOCCUS AUREUS       Susceptibility   Methicillin resistant staphylococcus aureus -  (no method available)    PENICILLIN >=0.5 Resistant     OXACILLIN >=4 Resistant     CEFAZOLIN  Resistant     GENTAMICIN <=0.5 Sensitive     CIPROFLOXACIN >=8 Resistant     LEVOFLOXACIN 4 Intermediate     TRIMETH/SULFA 80 Resistant     VANCOMYCIN 1 Sensitive     CLINDAMYCIN  Resistant     ERYTHROMYCIN >=8 Resistant     LINEZOLID 2 Sensitive     RIFAMPIN <=0.5 Sensitive     TETRACYCLINE <=1 Sensitive      ASSESSMENT AND PLAN:  34 y.o. year old male with slow healing abscess right angle of jaw This chart was scribed in my presence and reviewed by me personally.    ICD-9-CM ICD-10-CM   1. Cellulitis and abscess of face 682.0 L03.211 doxycycline (VIBRAMYCIN) 100 MG capsule    L02.01     Signed, Elvina Sidle, MD 11/17/2014 1:20 PM

## 2014-11-21 ENCOUNTER — Ambulatory Visit (INDEPENDENT_AMBULATORY_CARE_PROVIDER_SITE_OTHER): Payer: Managed Care, Other (non HMO) | Admitting: Physician Assistant

## 2014-11-21 VITALS — BP 100/60 | HR 70 | Temp 98.0°F | Resp 16

## 2014-11-21 DIAGNOSIS — L03211 Cellulitis of face: Secondary | ICD-10-CM

## 2014-11-21 DIAGNOSIS — L0201 Cutaneous abscess of face: Secondary | ICD-10-CM

## 2014-11-21 NOTE — Progress Notes (Signed)
   Subjective:    Patient ID: Joe Ray, male    DOB: 07-28-1980, 34 y.o.   MRN: [id number]  HPI  This is a 34 year old male who is presenting with wound care of abscess on face s/p I&D 10 days ago. He has been on bactrim and tolerating well. Last visit 4 days ago packing was pulled out completely. He has been keeping the area covered. He has noticed decreased pain. He denies fever, chills, N/V, abdominal pain.  Review of Systems  Constitutional: Negative for fever and chills.  Gastrointestinal: Negative for nausea, vomiting and abdominal pain.  Skin: Positive for wound.    Patient Active Problem List   Diagnosis Date Noted  . G E R D 06/30/2010  . COUGH 06/30/2010   Prior to Admission medications   Medication Sig Start Date End Date Taking? Authorizing Provider  desloratadine (CLARINEX) 5 MG tablet Take 5 mg by mouth daily.   Yes Historical Provider, MD  doxycycline (VIBRAMYCIN) 100 MG capsule Take 1 capsule (100 mg total) by mouth 2 (two) times daily. 11/17/14  Yes Elvina SidleKurt Lauenstein, MD  Iron-Vit C-Vit B12-Folic Acid (IRON 100 PLUS PO) Take by mouth.   Yes Historical Provider, MD  oxyCODONE-acetaminophen (ROXICET) 5-325 MG per tablet Take 1 tablet by mouth every 8 (eight) hours as needed for severe pain. 11/11/14  Yes Wallis BambergMario Mani, PA-C  sulfamethoxazole-trimethoprim (BACTRIM DS,SEPTRA DS) 800-160 MG per tablet Take 1 tablet by mouth 2 (two) times daily. 11/11/14  Yes Sherren MochaEva N Shaw, MD          Allergies  Allergen Reactions  . Amoxicillin Diarrhea and Rash   Patient's social and family history were reviewed.     Objective:   Physical Exam  Constitutional: He is oriented to person, place, and time. He appears well-developed and well-nourished. No distress.  HENT:  Head: Normocephalic and atraumatic.  Right Ear: Hearing normal.  Left Ear: Hearing normal.  Nose: Nose normal.  Eyes: Conjunctivae and lids are normal. Right eye exhibits no discharge. Left eye exhibits no discharge. No  scleral icterus.  Pulmonary/Chest: Effort normal. No respiratory distress.  Musculoskeletal: Normal range of motion.  Neurological: He is alert and oriented to person, place, and time.  Skin: Skin is warm and dry.  Scabbed wound at right lower jaw line. Mild tenderness around wound. Mild erythema. No induration or fluctuance.   Psychiatric: He has a normal mood and affect. His speech is normal and behavior is normal. Thought content normal.   BP 100/60 mmHg  Pulse 70  Temp(Src) 98 F (36.7 C) (Oral)  Resp 16  SpO2 98%     Assessment & Plan:  1. Cellulitis and abscess of face Wound now closed. Healing well. No signs of infection. He will complete abx. Advised no shaving until wound completely healed. He will return with further problems/concerns.  [place] MinersNicole V. Dyke BrackettBush, PA-C, MHS [facility]  11/21/2014

## 2014-11-23 NOTE — Progress Notes (Signed)
  Medical screening examination/treatment/procedure(s) were performed by non-physician practitioner and as supervising physician I was immediately available for consultation/collaboration.     

## 2014-12-15 ENCOUNTER — Ambulatory Visit (INDEPENDENT_AMBULATORY_CARE_PROVIDER_SITE_OTHER): Payer: Managed Care, Other (non HMO) | Admitting: Emergency Medicine

## 2014-12-15 VITALS — BP 104/58 | HR 88 | Temp 98.5°F | Resp 18 | Ht 68.5 in | Wt 156.0 lb

## 2014-12-15 DIAGNOSIS — L299 Pruritus, unspecified: Secondary | ICD-10-CM | POA: Diagnosis not present

## 2014-12-15 LAB — COMPREHENSIVE METABOLIC PANEL
ALBUMIN: 4.4 g/dL (ref 3.5–5.2)
ALT: 25 U/L (ref 0–53)
AST: 20 U/L (ref 0–37)
Alkaline Phosphatase: 80 U/L (ref 39–117)
BILIRUBIN TOTAL: 0.5 mg/dL (ref 0.2–1.2)
BUN: 10 mg/dL (ref 6–23)
CO2: 27 mEq/L (ref 19–32)
Calcium: 9.5 mg/dL (ref 8.4–10.5)
Chloride: 103 mEq/L (ref 96–112)
Creat: 0.81 mg/dL (ref 0.50–1.35)
Glucose, Bld: 97 mg/dL (ref 70–99)
Potassium: 4.3 mEq/L (ref 3.5–5.3)
Sodium: 140 mEq/L (ref 135–145)
TOTAL PROTEIN: 6.9 g/dL (ref 6.0–8.3)

## 2014-12-15 LAB — TSH: TSH: 1.159 u[IU]/mL (ref 0.350–4.500)

## 2014-12-15 MED ORDER — HYDROXYZINE HCL 25 MG PO TABS: 25.0000 mg | ORAL_TABLET | Freq: Three times a day (TID) | ORAL | Status: DC | PRN

## 2014-12-15 NOTE — Patient Instructions (Signed)
Pruritus  °Pruritus is an itch. There are many different problems that can cause an itch. Dry skin is one of the most common causes of itching. Most cases of itching do not require medical attention.  °HOME CARE INSTRUCTIONS  °Make sure your skin is moistened on a regular basis. A moisturizer that contains petroleum jelly is best for keeping moisture in your skin. If you develop a rash, you may try the following for relief:  °· Use corticosteroid cream. °· Apply cool compresses to the affected areas. °· Bathe with Epsom salts or baking soda in the bathwater. °· Soak in colloidal oatmeal baths. These are available at your pharmacy. °· Apply baking soda paste to the rash. Stir water into baking soda until it reaches a paste-like consistency. °· Use an anti-itch lotion. °· Take over-the-counter diphenhydramine medicine by mouth as the instructions direct. °· Avoid scratching. Scratching may cause the rash to become infected. If itching is very bad, your caregiver may suggest prescription lotions or creams to lessen your symptoms. °· Avoid hot showers, which can make itching worse. A cold shower may help with itching as long as you use a moisturizer after the shower. °SEEK MEDICAL CARE IF: °The itching does not go away after several days. °Document Released: 03/24/2011 Document Revised: 11/26/2013 Document Reviewed: 03/24/2011 °ExitCare® Patient Information ©2015 [employer]. This information is not intended to replace advice given to you by your health care provider. Make sure you discuss any questions you have with your health care provider. ° °

## 2014-12-15 NOTE — Progress Notes (Signed)
Subjective:  Patient ID: Joe Ray, male    DOB: [birth year]  Age: 34 y.o. MRN: [id number]  CC: Itching   HPI Joe Ray presents for evaluation of generalized itching. He denies any rash. He said that he has no new allergy exposure including new medication, personal healthcare products, or laundry detergent. He has no environmental or industrial exposure. He recently concluded a treatment with doxycycline for skin infection.  He denies any cutaneous eruption just as migratory itching that is rather bothersome and interferes with sleep. He's had no improvement with over-the-counter medication.  Outpatient Prescriptions Prior to Visit  Medication Sig Dispense Refill  . desloratadine (CLARINEX) 5 MG tablet Take 5 mg by mouth daily.    . Iron-Vit C-Vit B12-Folic Acid (IRON 100 PLUS PO) Take by mouth.    . montelukast (SINGULAIR) 10 MG tablet Take 1 tablet (10 mg total) by mouth at bedtime. 30 tablet 3  . doxycycline (VIBRAMYCIN) 100 MG capsule Take 1 capsule (100 mg total) by mouth 2 (two) times daily. 6 capsule 0  . oxyCODONE-acetaminophen (ROXICET) 5-325 MG per tablet Take 1 tablet by mouth every 8 (eight) hours as needed for severe pain. 15 tablet 0  . sulfamethoxazole-trimethoprim (BACTRIM DS,SEPTRA DS) 800-160 MG per tablet Take 1 tablet by mouth 2 (two) times daily. 28 tablet 0   No facility-administered medications prior to visit.    ROS Review of Systems  Constitutional: Negative for fever, chills and appetite change.  HENT: Negative for congestion, ear pain, postnasal drip, sinus pressure and sore throat.   Eyes: Negative for pain and redness.  Respiratory: Negative for cough, shortness of breath and wheezing.   Cardiovascular: Negative for leg swelling.  Gastrointestinal: Negative for nausea, vomiting, abdominal pain, diarrhea, constipation and blood in stool.  Endocrine: Negative for polyuria.  Genitourinary: Negative for dysuria, urgency, frequency and flank pain.    Musculoskeletal: Negative for gait problem.  Skin: Negative for rash.  Neurological: Negative for weakness and headaches.  Psychiatric/Behavioral: Negative for confusion and decreased concentration. The patient is not nervous/anxious.     Objective:  BP 104/58 mmHg  Pulse 88  Temp(Src) 98.5 F (36.9 C)  Resp 18  Ht 5' 8.5" (1.74 m)  Wt 156 lb (70.761 kg)  BMI 23.37 kg/m2  SpO2 99%  BP Readings from Last 3 Encounters:  12/15/14 104/58  11/21/14 100/60  11/17/14 98/66    Wt Readings from Last 3 Encounters:  12/15/14 156 lb (70.761 kg)  11/15/14 154 lb (69.854 kg)  11/13/14 155 lb (70.308 kg)    Physical Exam  Constitutional: He is oriented to person, place, and time. He appears well-developed and well-nourished.  HENT:  Head: Normocephalic and atraumatic.  Eyes: Conjunctivae are normal. Pupils are equal, round, and reactive to light.  Pulmonary/Chest: Effort normal.  Musculoskeletal: He exhibits no edema.  Neurological: He is alert and oriented to person, place, and time.  Skin: Skin is warm and dry. No rash noted. No erythema.  Psychiatric: He has a normal mood and affect. His behavior is normal. Thought content normal.    Lab Results  Component Value Date   WBC 6.0 09/03/2013   HGB 14.9 09/03/2013   HCT 47.4 09/03/2013      Assessment & Plan:   Joe Ray was seen today for itching.  Diagnoses and all orders for this visit:  Pruritic dermatitis Orders: -     CBC -     Comprehensive metabolic panel -     TSH -  Cancel: HIV antibody  Other orders -     hydrOXYzine (ATARAX/VISTARIL) 25 MG tablet; Take 1 tablet (25 mg total) by mouth 3 (three) times daily as needed for itching.   I have discontinued [name]'s sulfamethoxazole-trimethoprim, oxyCODONE-acetaminophen, and doxycycline. I am also having him start on hydrOXYzine. Additionally, I am having him maintain his montelukast, Iron-Vit C-Vit B12-Folic Acid (IRON 100 PLUS PO), and desloratadine.  Meds  ordered this encounter  Medications  . hydrOXYzine (ATARAX/VISTARIL) 25 MG tablet    Sig: Take 1 tablet (25 mg total) by mouth 3 (three) times daily as needed for itching.    Dispense:  50 tablet    Refill:  0     Follow-up: Return if symptoms worsen or fail to improve.  Carmelina Dane, MD

## 2014-12-17 ENCOUNTER — Telehealth: Payer: Self-pay | Admitting: Family Medicine

## 2014-12-17 LAB — CBC

## 2014-12-17 NOTE — Telephone Encounter (Signed)
[facility] called and stated that a purple top wasn't sent to do CBC

## 2015-10-13 ENCOUNTER — Encounter: Payer: Self-pay | Admitting: Family Medicine

## 2015-10-13 ENCOUNTER — Ambulatory Visit (INDEPENDENT_AMBULATORY_CARE_PROVIDER_SITE_OTHER): Payer: Managed Care, Other (non HMO) | Admitting: Family Medicine

## 2015-10-13 VITALS — BP 108/80 | HR 87 | Temp 98.3°F | Ht 68.0 in | Wt 158.0 lb

## 2015-10-13 DIAGNOSIS — R6889 Other general symptoms and signs: Secondary | ICD-10-CM | POA: Diagnosis not present

## 2015-10-13 LAB — POCT INFLUENZA A/B
INFLUENZA A, POC: NEGATIVE
INFLUENZA B, POC: NEGATIVE

## 2015-10-13 MED ORDER — OSELTAMIVIR PHOSPHATE 75 MG PO CAPS: 75.0000 mg | ORAL_CAPSULE | Freq: Two times a day (BID) | ORAL | Status: DC

## 2015-10-13 NOTE — Patient Instructions (Signed)
You probably have the flu Rest and drink plenty of fluids!  Use OTC medications as needed for your symptoms We will treat the flu with tamiflu Let me know if you are not improving in the next few days- Sooner if worse.

## 2015-10-13 NOTE — Progress Notes (Signed)
[facility] [address], [address] [place], [place] [address] [phone] [phone]  Date:  10/13/2015   Name:  Joe Ray   DOB:  April 16, 1981   MRN:  [id number]  PCP:  Abbe AmsterdamOPLAND,Arwilda Georgia, MD    Chief Complaint: Cough   History of Present Illness:  Joe Ray is a 35 y.o. very pleasant male patient who presents with the following:  Here today with acute illness- today is Monday and he first noted sx on Friday Cough, chills, aches, ST, fatigue Chills are better now, but the cough is still severe He did have a fever yesterday (subjective) but did not check his temperature  No GI symptoms except nausea this am.  No vomiting or diarrhea  He is generally in good health No sick contacts Today he took tylenol and mucinex   Patient Active Problem List   Diagnosis Date Noted  . G E R D 06/30/2010  . COUGH 06/30/2010    Past Medical History  Diagnosis Date  . Allergy     No past surgical history on file.  Social History  Substance Use Topics  . Smoking status: Never Smoker   . Smokeless tobacco: None  . Alcohol Use: None    Family History  Problem Relation Age of Onset  . Diabetes Father     Allergies  Allergen Reactions  . Amoxicillin Diarrhea and Rash    Medication list has been reviewed and updated.  Current Outpatient Prescriptions on File Prior to Visit  Medication Sig Dispense Refill  . desloratadine (CLARINEX) 5 MG tablet Take 5 mg by mouth daily.    . Iron-Vit C-Vit B12-Folic Acid (IRON 100 PLUS PO) Take by mouth.    . montelukast (SINGULAIR) 10 MG tablet Take 1 tablet (10 mg total) by mouth at bedtime. 30 tablet 3   No current facility-administered medications on file prior to visit.    Review of Systems:  As per HPI- otherwise negative.   Physical Examination: Filed Vitals:   10/13/15 1304  BP: 108/80  Pulse: 87  Temp: 98.3 F (36.8 C)   Filed Vitals:   10/13/15 1304  Height: 5\' 8"  (1.727 m)   Weight: 158 lb (71.668 kg)   Body mass index is 24.03 kg/(m^2). Ideal Body Weight: Weight in (lb) to have BMI = 25: 164.1  GEN: WDWN, NAD, Non-toxic, A & O x 3, looks well, normal weight HEENT: Atraumatic, Normocephalic. Neck supple. No masses, No LAD.  Bilateral TM wnl, oropharynx normal.  PEERL,EOMI.   Ears and Nose: No external deformity. CV: RRR, No M/G/R. No JVD. No thrill. No extra heart sounds. PULM: CTA B, no wheezes, crackles, rhonchi. No retractions. No resp. distress. No accessory muscle use. ABD: S, NT, ND EXTR: No c/c/e NEURO Normal gait.  PSYCH: Normally interactive. Conversant. Not depressed or anxious appearing.  Calm demeanor.   Results for orders placed or performed in visit on 10/13/15  POCT Influenza A/B  Result Value Ref Range   Influenza A, POC Negative Negative   Influenza B, POC Negative Negative    Assessment and Plan: Flu-like symptoms - Plan: POCT Influenza A/B, oseltamivir (TAMIFLU) 75 MG capsule here today with possible flu with false negative rapid test.  He would like to use tamiflu which is reasonable.  Otherwise he will continue supportive care and will be in touch with me if not feeling better in the next few days- Sooner if worse.  Signed Lamar Blinks, MD

## 2015-10-13 NOTE — Progress Notes (Signed)
Pre visit review using our clinic review tool, if applicable. No additional management support is needed unless otherwise documented below in the visit note. 

## 2018-03-20 ENCOUNTER — Ambulatory Visit: Payer: Commercial Managed Care - PPO | Admitting: Family Medicine

## 2018-03-20 ENCOUNTER — Encounter: Payer: Self-pay | Admitting: Family Medicine

## 2018-03-20 VITALS — BP 118/70 | HR 77 | Temp 98.4°F | Resp 16 | Ht 68.0 in | Wt 163.0 lb

## 2018-03-20 DIAGNOSIS — R202 Paresthesia of skin: Secondary | ICD-10-CM

## 2018-03-20 DIAGNOSIS — Z131 Encounter for screening for diabetes mellitus: Secondary | ICD-10-CM | POA: Diagnosis not present

## 2018-03-20 DIAGNOSIS — R200 Anesthesia of skin: Secondary | ICD-10-CM | POA: Diagnosis not present

## 2018-03-20 NOTE — Patient Instructions (Addendum)
We are going to get some blood work today, and if all normal we will proceed with a circulation test for your feet.   If we still don't find anything we will set you up to see neurology   Please let me know if any changes or worsening of your symptoms in the meantime

## 2018-03-20 NOTE — Progress Notes (Addendum)
[facility] [address], [address] [place], [place] [address] [phone]  Date:  03/20/2018   Name:  Joe Beerseel K Yuhas   DOB:  06-22-1981   MRN:  [id number]  PCP:  Pearline Cablesopland, Odis Wickey C, MD    Chief Complaint: Foot Swelling (bilateral swelling off and on, no weight gain, redness, tingling in toes, burning on top of feet, quickly fall asleep)   History of Present Illness:  Joe Ray is a 37 y.o. very pleasant male patient who presents with the following:  Last seen by myself a couple of years ago- here today with an acute issue He has noted bilateral foot swelling and tingling- he has noted this for 2-3 weeks It is better (althought not resolved) in the am, gets worse as the day goes on Elevating his feet or epson salt baths help  He has noted tingling in fingers of both hands for a few days only He feels like his grip is not as strong as it should be   His feet may hurt with walking but he cannot really determine any pattern here.  It does not sound like claudiaction  He has generally been in good health, but has felt fatigued recently - he is not sure if this might be related  No family history of MS or other neurological disorder as far as he jnows   Today [birth year] his birthday  No change in his routine He has generally exercised on the bike or treadmill- no change in his regimen prior to onset of these sx, but since he has been having trouble he is not exercising as much  Patient Active Problem List   Diagnosis Date Noted  . G E R D 06/30/2010  . COUGH 06/30/2010    Past Medical History:  Diagnosis Date  . Allergy     No past surgical history on file.  Social History   Tobacco Use  . Smoking status: Never Smoker  . Smokeless tobacco: Never Used  Substance Use Topics  . Alcohol use: Not on file  . Drug use: Not on file    Family History  Problem Relation Age of Onset  . Diabetes Father     Allergies  Allergen  Reactions  . Amoxicillin Diarrhea and Rash    Medication list has been reviewed and updated.  Current Outpatient Medications on File Prior to Visit  Medication Sig Dispense Refill  . Cholecalciferol (VITAMIN D-3) 1000 units CAPS Take by mouth.    Marland Kitchen. guaiFENesin (ROBITUSSIN) 100 MG/5ML liquid Take 200 mg by mouth every 6 (six) hours as needed for cough.    . Multiple Vitamin (MULTIVITAMIN) tablet Take 1 tablet by mouth daily.    . montelukast (SINGULAIR) 10 MG tablet Take 1 tablet (10 mg total) by mouth at bedtime. 30 tablet 3   No current facility-administered medications on file prior to visit.     Review of Systems:  As per HPI- otherwise negative. No fever, chills, no vomiting He is not quite as energetic as usual He has noted headaches but no chanae from his normal pattern He has tried some topical ointments on his legs and feet    Physical Examination: Vitals:   03/20/18 1421  BP: 118/70  Pulse: 77  Resp: 16  Temp: 98.4 F (36.9 C)  SpO2: 100%   Vitals:   03/20/18 1421  Weight: 163 lb (73.9 kg)  Height: 5\' 8"  (1.727 m)  Body mass index is 24.78 kg/m. Ideal Body Weight: Weight in (lb) to have BMI = 25: 164.1  GEN: WDWN, NAD, Non-toxic, A & O x 3, looks well, normal weight  HEENT: Atraumatic, Normocephalic. Neck supple. No masses, No LAD.  Bilateral TM wnl, oropharynx normal.  PEERL,EOMI.   Ears and Nose: No external deformity. CV: RRR, No M/G/R. No JVD. No thrill. No extra heart sounds. PULM: CTA B, no wheezes, crackles, rhonchi. No retractions. No resp. distress. No accessory muscle use. EXTR: No c/c/e NEURO Normal gait.  PSYCH: Normally interactive. Conversant. Not depressed or anxious appearing.  Calm demeanor.  Feet: normal pulses and cap refill He is able to perceive all monofilament sites tested  No swelling Hands: normal strength except grip is not as strong as expected Normal pulses and cap refil   Assessment and Plan: Numbness and tingling of  foot - Plan: CBC, Comprehensive metabolic panel, TSH, Ferritin, B12, Folate, VAS [place] ABI WITH/WO TBI  Screening for diabetes mellitus - Plan: Comprehensive metabolic panel, Hemoglobin A1c  Here today with concern about swelling and numbness/ tingling of his feet for a few weeks and then just recently his hands Labs pending as above Ordered ABI but will cancel if we find a cause in his labs If all labs and ABI normal plan to have him see neurology  He will alert me if any changes or worsening in the meantime   Signed Abbe Amsterdam, MD  Received labs 8/27- message to pt  Results for orders placed or performed in visit on 03/20/18  CBC  Result Value Ref Range   WBC 7.9 4.0 - 10.5 K/uL   RBC 5.24 4.22 - 5.81 Mil/uL   Platelets 249.0 150.0 - 400.0 K/uL   Hemoglobin 14.6 13.0 - 17.0 g/dL   HCT 16.1 09.6 - 04.5 %   MCV 83.6 78.0 - 100.0 fl   MCHC 33.2 30.0 - 36.0 g/dL   RDW 40.9 81.1 - 91.4 %  Comprehensive metabolic panel  Result Value Ref Range   Sodium 139 135 - 145 mEq/L   Potassium 4.3 3.5 - 5.1 mEq/L   Chloride 103 96 - 112 mEq/L   CO2 31 19 - 32 mEq/L   Glucose, Bld 88 70 - 99 mg/dL   BUN 15 6 - 23 mg/dL   Creatinine, Ser 7.82 0.40 - 1.50 mg/dL   Total Bilirubin 0.4 0.2 - 1.2 mg/dL   Alkaline Phosphatase 77 39 - 117 U/L   AST 18 0 - 37 U/L   ALT 22 0 - 53 U/L   Total Protein 6.8 6.0 - 8.3 g/dL   Albumin 4.3 3.5 - 5.2 g/dL   Calcium 9.8 8.4 - 95.6 mg/dL   GFR 213.08 >65.78 mL/min  TSH  Result Value Ref Range   TSH 1.59 0.35 - 4.50 uIU/mL  Ferritin  Result Value Ref Range   Ferritin 29.2 22.0 - 322.0 ng/mL  B12  Result Value Ref Range   Vitamin B-12 216 211 - 911 pg/mL  Folate  Result Value Ref Range   Folate 14.8 >5.9 ng/mL  Hemoglobin A1c  Result Value Ref Range   Hgb A1c MFr Bld 5.5 4.6 - 6.5 %

## 2018-03-21 ENCOUNTER — Encounter: Payer: Self-pay | Admitting: Family Medicine

## 2018-03-21 LAB — CBC
HCT: 43.9 % (ref 39.0–52.0)
HEMOGLOBIN: 14.6 g/dL (ref 13.0–17.0)
MCHC: 33.2 g/dL (ref 30.0–36.0)
MCV: 83.6 fl (ref 78.0–100.0)
PLATELETS: 249 10*3/uL (ref 150.0–400.0)
RBC: 5.24 Mil/uL (ref 4.22–5.81)
RDW: 12.9 % (ref 11.5–15.5)
WBC: 7.9 10*3/uL (ref 4.0–10.5)

## 2018-03-21 LAB — COMPREHENSIVE METABOLIC PANEL
ALK PHOS: 77 U/L (ref 39–117)
ALT: 22 U/L (ref 0–53)
AST: 18 U/L (ref 0–37)
Albumin: 4.3 g/dL (ref 3.5–5.2)
BILIRUBIN TOTAL: 0.4 mg/dL (ref 0.2–1.2)
BUN: 15 mg/dL (ref 6–23)
CALCIUM: 9.8 mg/dL (ref 8.4–10.5)
CO2: 31 meq/L (ref 19–32)
CREATININE: 0.82 mg/dL (ref 0.40–1.50)
Chloride: 103 mEq/L (ref 96–112)
GFR: 112.36 mL/min (ref >60.00)
Glucose, Bld: 88 mg/dL (ref 70–99)
Potassium: 4.3 mEq/L (ref 3.5–5.1)
Sodium: 139 mEq/L (ref 135–145)
Total Protein: 6.8 g/dL (ref 6.0–8.3)

## 2018-03-21 LAB — VITAMIN B12: VITAMIN B 12: 216 pg/mL (ref 211–911)

## 2018-03-21 LAB — FERRITIN: FERRITIN: 29.2 ng/mL (ref 22.0–322.0)

## 2018-03-21 LAB — FOLATE: Folate: 14.8 ng/mL (ref >5.9)

## 2018-03-21 LAB — HEMOGLOBIN A1C: HEMOGLOBIN A1C: 5.5 % (ref 4.6–6.5)

## 2018-03-21 LAB — TSH: TSH: 1.59 u[IU]/mL (ref 0.35–4.50)

## 2018-03-23 NOTE — Telephone Encounter (Signed)
Patient called for results- reviewed results and provider recommendations with him. He states understanding and he will move forward with testing.

## 2018-03-28 ENCOUNTER — Ambulatory Visit (HOSPITAL_COMMUNITY)
Admission: RE | Admit: 2018-03-28 | Discharge: 2018-03-28 | Disposition: A | Discharge status: Home / Self Care | Payer: Commercial Managed Care - PPO | Source: Ambulatory Visit | Attending: Vascular Surgery | Admitting: Vascular Surgery

## 2018-03-28 DIAGNOSIS — R202 Paresthesia of skin: Secondary | ICD-10-CM

## 2018-03-28 DIAGNOSIS — R9389 Abnormal findings on diagnostic imaging of other specified body structures: Secondary | ICD-10-CM | POA: Diagnosis not present

## 2018-03-28 DIAGNOSIS — R200 Anesthesia of skin: Secondary | ICD-10-CM | POA: Insufficient documentation

## 2018-03-29 ENCOUNTER — Other Ambulatory Visit: Payer: Self-pay | Admitting: Family Medicine

## 2018-03-29 ENCOUNTER — Encounter: Payer: Self-pay | Admitting: Family Medicine

## 2018-03-29 DIAGNOSIS — R200 Anesthesia of skin: Secondary | ICD-10-CM

## 2018-03-29 DIAGNOSIS — R202 Paresthesia of skin: Secondary | ICD-10-CM

## 2018-03-30 ENCOUNTER — Telehealth: Payer: Self-pay

## 2018-03-30 NOTE — Telephone Encounter (Signed)
Copied from CRM [id number]. Topic: General - Other >> Mar 30, 2018  5:04 PM Trula Slade wrote: Reason for CRM:   Patient would like a call back with the results of his recent labs

## 2018-04-01 ENCOUNTER — Encounter: Payer: Self-pay | Admitting: Family Medicine

## 2018-04-03 ENCOUNTER — Encounter: Payer: Self-pay | Admitting: Diagnostic Neuroimaging

## 2018-04-03 ENCOUNTER — Ambulatory Visit: Payer: Commercial Managed Care - PPO | Admitting: Diagnostic Neuroimaging

## 2018-04-03 VITALS — BP 109/76 | HR 86 | Ht 68.0 in | Wt 160.0 lb

## 2018-04-03 DIAGNOSIS — R200 Anesthesia of skin: Secondary | ICD-10-CM | POA: Diagnosis not present

## 2018-04-03 DIAGNOSIS — R202 Paresthesia of skin: Secondary | ICD-10-CM | POA: Diagnosis not present

## 2018-04-03 NOTE — Patient Instructions (Signed)
-   replace B12 ( daily); repeat labs in 4-6 weeks with PCP  - check emg/ncv (electrical nerve testing)

## 2018-04-03 NOTE — Progress Notes (Signed)
[facility]  PATIENT: Joe Ray DOB: 12-15-1980  REFERRING CLINICIAN: J Copland HISTORY FROM: patient  REASON FOR VISIT: new consult    HISTORICAL  CHIEF COMPLAINT:  Chief Complaint  Patient presents with  . New Patient (Initial Visit)    Rm 6, alone  . Numbness of foot    Dr. Patsy Lager.   Noted started 3-4 wks ago, burning, tingling, numbness.     HISTORY OF PRESENT ILLNESS:   37 year old male here for evaluation of numbness and tingling.  3 weeks ago patient noticed numbness, swelling, redness in his feet and legs.  This progressed to tingling and burning sensation.  He has noticed some abnormal sensations in his fingers and hands.  Also some sensations in his face.  Sensations are intermittent and have plateaued.  No prodromal infections, vaccinations, injuries or traumas.  Patient has a vegetarian diet.   REVIEW OF SYSTEMS: Full 14 system review of systems performed and negative with exception of: Fatigue weakness restless legs.  ALLERGIES: Allergies  Allergen Reactions  . Amoxicillin Diarrhea and Rash    HOME MEDICATIONS: Outpatient Medications Prior to Visit  Medication Sig Dispense Refill  . Cholecalciferol (VITAMIN D-3) 1000 units CAPS Take by mouth.    . Multiple Vitamin (MULTIVITAMIN) tablet Take 1 tablet by mouth daily.    Marland Kitchen guaiFENesin (ROBITUSSIN) 100 MG/5ML liquid Take 200 mg by mouth every 6 (six) hours as needed for cough.    . montelukast (SINGULAIR) 10 MG tablet Take 1 tablet (10 mg total) by mouth at bedtime. 30 tablet 3   No facility-administered medications prior to visit.     PAST MEDICAL HISTORY: Past Medical History:  Diagnosis Date  . Allergy     PAST SURGICAL HISTORY: No past surgical history on file.  FAMILY HISTORY: Family History  Problem Relation Age of Onset  . Diabetes Father     SOCIAL HISTORY: Social History   Socioeconomic History  . Marital status: Single    Spouse name: Not on file  .  Number of children: Not on file  . Years of education: Not on file  . Highest education level: Not on file  Occupational History  . Not on file  Social Needs  . Financial resource strain: Not on file  . Food insecurity:    Worry: Not on file    Inability: Not on file  . Transportation needs:    Medical: Not on file    Non-medical: Not on file  Tobacco Use  . Smoking status: Never Smoker  . Smokeless tobacco: Never Used  Substance and Sexual Activity  . Alcohol use: Never    Frequency: Never  . Drug use: Never  . Sexual activity: Not on file  Lifestyle  . Physical activity:    Days per week: Not on file    Minutes per session: Not on file  . Stress: Not on file  Relationships  . Social connections:    Talks on phone: Not on file    Gets together: Not on file    Attends religious service: Not on file    Active member of club or organization: Not on file    Attends meetings of clubs or organizations: Not on file    Relationship status: Not on file  . Intimate partner violence:    Fear of current or ex partner: Not on file    Emotionally abused: Not on file    Physically abused: Not on file    Forced sexual  activity: Not on file  Other Topics Concern  . Not on file  Social History Narrative  . Not on file     PHYSICAL EXAM  GENERAL EXAM/CONSTITUTIONAL: Vitals:  Vitals:   04/03/18 1443  BP: 109/76  Pulse: 86  Weight: 160 lb (72.6 kg)  Height: 5\' 8"  (1.727 m)     Body mass index is 24.33 kg/m. Wt Readings from Last 3 Encounters:  04/03/18 160 lb (72.6 kg)  03/20/18 163 lb (73.9 kg)  10/13/15 158 lb (71.7 kg)     Patient is in no distress; well developed, nourished and groomed; neck is supple  CARDIOVASCULAR:  Examination of carotid arteries is normal; no carotid bruits  Regular rate and rhythm, no murmurs  Examination of peripheral vascular system by observation and palpation is normal  EYES:  Ophthalmoscopic exam of optic discs and posterior  segments is normal; no papilledema or hemorrhages  No exam data present  MUSCULOSKELETAL:  Gait, strength, tone, movements noted in Neurologic exam below  NEUROLOGIC: MENTAL STATUS:  No flowsheet data found.  awake, alert, oriented to person, place and time  recent and remote memory intact  normal attention and concentration  language fluent, comprehension intact, naming intact  fund of knowledge appropriate  CRANIAL NERVE:   2nd - no papilledema on fundoscopic exam  2nd, 3rd, 4th, 6th - pupils equal and reactive to light, visual fields full to confrontation, extraocular muscles intact, no nystagmus  5th - facial sensation symmetric  7th - facial strength symmetric  8th - hearing intact  9th - palate elevates symmetrically, uvula midline  11th - shoulder shrug symmetric  12th - tongue protrusion midline  MOTOR:   normal bulk and tone, full strength in the BUE, BLE; EXCEPT SLIGHTLY DECR IN HIP FLEXION AND KNEE FLEXION  SENSORY:   normal and symmetric to light touch, pinprick, temperature, vibration; EXCEPT DECR PP IN FEET  COORDINATION:   finger-nose-finger, fine finger movements normal  REFLEXES:   deep tendon reflexes --> BUE TRACE, KNEES 1, ANKLES 0  GAIT/STATION:   narrow based gait; able to walk on toes, heels; romberg is negative     DIAGNOSTIC DATA (LABS, IMAGING, TESTING) - I reviewed patient records, labs, notes, testing and imaging myself where available.  Lab Results  Component Value Date   WBC 7.9 03/20/2018   HGB 14.6 03/20/2018   HCT 43.9 03/20/2018   MCV 83.6 03/20/2018   PLT 249.0 03/20/2018      Component Value Date/Time   NA 139 03/20/2018 1500   K 4.3 03/20/2018 1500   CL 103 03/20/2018 1500   CO2 31 03/20/2018 1500   GLUCOSE 88 03/20/2018 1500   BUN 15 03/20/2018 1500   CREATININE 0.82 03/20/2018 1500   CREATININE 0.81 12/15/2014 1133   CALCIUM 9.8 03/20/2018 1500   PROT 6.8 03/20/2018 1500   ALBUMIN 4.3  03/20/2018 1500   AST 18 03/20/2018 1500   ALT 22 03/20/2018 1500   ALKPHOS 77 03/20/2018 1500   BILITOT 0.4 03/20/2018 1500   No results found for: CHOL, HDL, LDLCALC, LDLDIRECT, TRIG, CHOLHDL Lab Results  Component Value Date   HGBA1C 5.5 03/20/2018   Lab Results  Component Value Date   VITAMINB12 216 03/20/2018   Lab Results  Component Value Date   TSH 1.59 03/20/2018    11/29/11 CXR [I reviewed images myself and agree with interpretation. -VRP]  1.  No acute cardiopulmonary disease. 2.  Mild interstitial coarsening is likely chronic.    ASSESSMENT  AND PLAN  37 y.o. year old male here with new onset rapidly progressive numbness, tingling, burning sensation in hands and feet.  Concerning for acute neuropathy such as Guillain-Barr syndrome.  We will proceed with further work-up.   Ddx: AIDP vs B12 deficiency  1. Numbness and tingling     PLAN:  - replace B12 ( daily); repeat labs in 4-6 weeks with PCP - check emg/ncv (eval for AIDP or other neuropathy)  Orders Placed This Encounter  Procedures  . NCV with EMG(electromyography)   Return pending symptoms.  I reviewed images, labs, notes, records myself. I summarized findings and reviewed with patient, for this high risk condition (rapid onset numbness / tingling) requiring high complexity decision making.    Suanne Marker, MD 04/03/2018, 2:58 PM Certified in Neurology, Neurophysiology and Neuroimaging  [facility] [address], [address] [place], [place] [address] [phone]

## 2018-04-03 NOTE — Telephone Encounter (Signed)
Results given.

## 2018-04-05 ENCOUNTER — Ambulatory Visit (INDEPENDENT_AMBULATORY_CARE_PROVIDER_SITE_OTHER): Payer: Commercial Managed Care - PPO | Admitting: Diagnostic Neuroimaging

## 2018-04-05 ENCOUNTER — Encounter: Payer: Commercial Managed Care - PPO | Admitting: Diagnostic Neuroimaging

## 2018-04-05 DIAGNOSIS — R200 Anesthesia of skin: Secondary | ICD-10-CM

## 2018-04-05 DIAGNOSIS — R202 Paresthesia of skin: Secondary | ICD-10-CM

## 2018-04-05 DIAGNOSIS — Z0289 Encounter for other administrative examinations: Secondary | ICD-10-CM

## 2018-04-07 NOTE — Progress Notes (Signed)
EMG nerve conduction study is normal.  Will continue B12 replacement therapy.  Monitor symptoms over the next 2 to 4 weeks.  If symptoms do not improve or start to worsen, then I will recommend to check MRI of the brain and cervical spine.  Suanne MarkerVIKRAM R. PENUMALLI, MD 04/07/2018, 10:23 AM Certified in Neurology, Neurophysiology and Neuroimaging  [facility] [address], [address] [place], [place] [address] [phone]

## 2018-04-07 NOTE — Procedures (Signed)
[facility]  NCS (NERVE CONDUCTION STUDY) WITH EMG (ELECTROMYOGRAPHY) REPORT   STUDY DATE: 04/05/18 PATIENT NAME: Joe Ray DOB: 09-26-1980 MRN: [id number]  ORDERING CLINICIAN: Joycelyn Schmid, MD   TECHNOLOGIST: Charlesetta Ivory ELECTROMYOGRAPHER: Glenford Bayley. Alisen Marsiglia, MD  CLINICAL INFORMATION: 37 year old male with upper and lower extremity numbness and pain.  FINDINGS: NERVE CONDUCTION STUDY: Right median, right ulnar, right peroneal and bilateral tibial motor responses are normal.  Right ulnar and bilateral tibial F wave latencies normal.  Right radial, right median, right ulnar, bilateral sural and bilateral superficial peroneal sensory response normal.   NEEDLE ELECTROMYOGRAPHY: Needle examination of right lower extremity vastus medialis, tibials anterior, gastrocnemius is normal.   IMPRESSION:   This is a normal study.  No electrodiagnostic evidence of large fiber neuropathy at this time.    INTERPRETING PHYSICIAN:  Suanne Marker, MD Certified in Neurology, Neurophysiology and Neuroimaging  [facility] [address] [place], [place] [address] [phone]   [facility]    Nerve / Sites Muscle Latency Ref. Amplitude Ref. Rel Amp Segments Distance Velocity Ref. Area    ms ms mV mV %  cm m/s m/s mVms  R Median - APB     Wrist APB 3.1 ?4.4 6.4 ?4.0 100 Wrist - APB 7   20.9     Upper arm APB 7.2  6.9  107 Upper arm - Wrist 23 55 ?49 20.8  R Ulnar - ADM     Wrist ADM 2.6 ?3.3 7.1 ?6.0 100 Wrist - ADM 7   25.8     B.Elbow ADM 5.8  7.1  101 B.Elbow - Wrist 20 61 ?49 25.6     A.Elbow ADM 8.0  7.3  103 A.Elbow - B.Elbow 12 56 ?49 26.0         A.Elbow - Wrist      R Peroneal - EDB     Ankle EDB 4.0 ?6.5 5.6 ?2.0 100 Ankle - EDB 9   19.4     Fib head EDB 10.3  6.2  112 Fib head - Ankle 32 51 ?44 22.0     Pop fossa EDB 12.3  6.0  96.4 Pop fossa - Fib head 10 49 ?44 21.3         Pop fossa - Ankle      R Tibial - AH   Ankle AH 3.7 ?5.8 13.8 ?4.0 100 Ankle - AH 9   28.8     Pop fossa AH 12.5  11.6  84.4 Pop fossa - Ankle 38 43 ?41 27.7  L Tibial - AH     Ankle AH 3.8 ?5.8 11.1 ?4.0 100 Ankle - AH 9   29.2     Pop fossa AH 12.3  8.1  72.5 Pop fossa - Ankle 38 44 ?41 28.5               SNC    Nerve / Sites Rec. Site Peak Lat Ref.  Amp Ref. Segments Distance    ms ms V V  cm  R Radial - Anatomical snuff box (Forearm)     Forearm Wrist 2.3 ?2.9 31 ?15 Forearm - Wrist 10  R Sural - Ankle (Calf)     Calf Ankle 3.3 ?4.4 27 ?6 Calf - Ankle 14  L Sural - Ankle (Calf)     Calf Ankle 3.8 ?4.4 27 ?6 Calf - Ankle 14  R Superficial peroneal - Ankle     Lat leg Ankle  3.4 ?4.4 11 ?6 Lat leg - Ankle 14  L Superficial peroneal - Ankle     Lat leg Ankle 3.5 ?4.4 18 ?6 Lat leg - Ankle 14  R Median - Orthodromic (Dig II, Mid palm)     Dig II Wrist 2.6 ?3.4 35 ?10 Dig II - Wrist 13  R Ulnar - Orthodromic, (Dig V, Mid palm)     Dig V Wrist 2.3 ?3.1 15 ?5 Dig V - Wrist 3311                   F  Wave    Nerve F Lat Ref.   ms ms  R Tibial - AH 49.5 ?56.0  L Tibial - AH 49.8 ?56.0  R Ulnar - ADM 27.2 ?32.0           EMG full       EMG Summary Table    Spontaneous MUAP Recruitment  Muscle IA Fib PSW Fasc Other Amp Dur. Poly Pattern  R. Vastus medialis Normal None None None _______ Normal Normal Normal Normal  R. Tibialis anterior Normal None None None _______ Normal Normal Normal Normal  R. Gastrocnemius (Medial head) Normal None None None _______ Normal Normal Normal Normal

## 2018-05-04 ENCOUNTER — Encounter: Payer: Commercial Managed Care - PPO | Admitting: Diagnostic Neuroimaging

## 2018-06-26 ENCOUNTER — Encounter: Payer: Self-pay | Admitting: Family Medicine

## 2018-06-27 NOTE — Progress Notes (Signed)
[facility] [address] [place], [place] [address] [phone]  Date:  06/28/2018   Name:  Joe Ray   DOB:  01-Jul-1981   MRN:  [id number]  PCP:  Joe Ray, Joe Honeyman C, MD    Chief Complaint: No chief complaint on file.   History of Present Illness:  Joe Ray is a 37 y.o. very pleasant male patient who presents with the following:  Coming in for foot pain today- I had seen him in August for bilateral foot pain as follows: Last seen by myself a couple of years ago- here today with an acute issue He has noted bilateral foot swelling and tingling- he has noted this for 2-3 weeks It is better (althought not resolved) in the am, gets worse as the day goes on Elevating his feet or epson salt baths help He has noted tingling in fingers of both hands for a few days only He feels like his grip is not as strong as it should be  His feet may hurt with walking but he cannot really determine any pattern here.  It does not sound like claudiaction He has generally been in good health, but has felt fatigued recently - he is not sure if this might be related  No family history of MS or other neurological disorder as far as he jnows  He has generally exercised on the bike or treadmill- no change in his regimen prior to onset of these sx, but since he has been having trouble he is not exercising as much   We did ABI which were ok except for slight abnl to one great toe: Final Interpretation: Right: Resting right ankle-brachial index is within normal range. No evidence of significant right lower extremity arterial disease. The right toe-brachial index is abnormal. RT great toe pressure = 78 mmHg. Left: Resting left ankle-brachial index is within normal range. No evidence of significant left lower extremity arterial disease. The left toe-brachial index is normal. LT Great toe pressure = 86 mmHg.  Referred to neurology, he saw Dr.  Marjory Ray in September: 37 y.o. year old male here with new onset rapidly progressive numbness, tingling, burning sensation in hands and feet.  Concerning for acute neuropathy such as Guillain-Barr syndrome.  We will proceed with further work-up. Ddx: AIDP vs B12 deficiency  1. Numbness and tingling    PLAN: - replace B12 (1000mcg daily); repeat labs in 4-6 weeks with PCP - check emg/ncv (eval for AIDP or other neuropathy)  They did EMG was was ok- per his most recent note: EMG nerve conduction study is normal.  Will continue B12 replacement therapy.  Monitor symptoms over the next 2 to 4 weeks.  If symptoms do not improve or start to worsen, then I will recommend to check MRI of the brain and cervical spine.   Flu vaccine:  Done   He is taking B12- his sx seemed to get better but then over the last 7- 10 days got worse again His right foot actually swelled a few days ago, this is now resolved   He is trying to do an epson salt soak and peppermint oil  He may feel some "zings" in his feet, and has sx of restless legs at night as well He is propping his feet up at night on some pillows; this helps, but as he kicks the pillows away while asleep he may be woken up by his feet hurting  He is taking tylenol- 2 ES once or twice a day  His feet do feel better when he is up and walking around during the day His sx are actually worse at rest  However prolonged standing can also worsen his sx   He also feels like his grip strength is weak  He is trying to get stronger by exercising but cannot seem to put on muscle mass He feels like he is losing weight He would like to see PT to work on his overall strength;this is fine with me  Patient Active Problem List   Diagnosis Date Noted  . G E R D 06/30/2010  . COUGH 06/30/2010    Past Medical History:  Diagnosis Date  . Allergy     No past surgical history on file.  Social History   Tobacco Use  . Smoking status: Never Smoker  .  Smokeless tobacco: Never Used  Substance Use Topics  . Alcohol use: Never    Frequency: Never  . Drug use: Never    Family History  Problem Relation Age of Onset  . Diabetes Father     Allergies  Allergen Reactions  . Amoxicillin Diarrhea and Rash    Medication list has been reviewed and updated.  Current Outpatient Medications on File Prior to Visit  Medication Sig Dispense Refill  . Cholecalciferol (VITAMIN D-3) 1000 units CAPS Take by mouth.    . Multiple Vitamin (MULTIVITAMIN) tablet Take 1 tablet by mouth daily.    . vitamin B-12 (CYANOCOBALAMIN) 100 MCG tablet Take 100 mcg by mouth daily.     No current facility-administered medications on file prior to visit.     Review of Systems:  As per HPI- otherwise negative. No fever or chills    Physical Examination: Vitals:   06/28/18 1107  BP: 110/73  Pulse: 72  Resp: 16  Temp: 98.6 F (37 Ray)  SpO2: 100%   Vitals:   06/28/18 1107  Weight: 159 lb (72.1 kg)  Height: 5\' 8"  (1.727 m)   Body mass index is 24.18 kg/m. Ideal Body Weight: Weight in (lb) to have BMI = 25: 164.1  GEN: WDWN, NAD, Non-toxic, A & O x 3, looks well, normal weight HEENT: Atraumatic, Normocephalic. Neck supple. No masses, No LAD. Ears and Nose: No external deformity. CV: RRR, No M/G/R. No JVD. No thrill. No extra heart sounds. PULM: CTA B, no wheezes, crackles, rhonchi. No retractions. No resp. distress. No accessory muscle use. EXTR: No Ray/Ray/e NEURO Normal gait.  PSYCH: Normally interactive. Conversant. Not depressed or anxious appearing.  Calm demeanor.  His grip strength is 4/5 Other upper extremity strength 4+/5 Hamstring strength also 4/5   Assessment and Plan: Numbness and tingling of foot  Vitamin B12 deficiency - Plan: B12  Weakness generalized  Here today with concern of his foot numbness/tingling/ pain getting worse However he also has noticed more generalized weakness  Check B12- he is taking orally. If still low he  might need shots Message to neurology about ordering an MRI for him- details about how to order test  Signed Abbe [place], MD  B12 is normal- message to pt Dr. Marjory Lies responded to me and he plans to order the MRI scans for this pt

## 2018-06-28 ENCOUNTER — Encounter: Payer: Self-pay | Admitting: Family Medicine

## 2018-06-28 ENCOUNTER — Ambulatory Visit: Payer: Commercial Managed Care - PPO | Admitting: Family Medicine

## 2018-06-28 ENCOUNTER — Other Ambulatory Visit: Payer: Self-pay | Admitting: Diagnostic Neuroimaging

## 2018-06-28 VITALS — BP 110/73 | HR 72 | Temp 98.6°F | Resp 16 | Ht 68.0 in | Wt 159.0 lb

## 2018-06-28 DIAGNOSIS — R200 Anesthesia of skin: Secondary | ICD-10-CM

## 2018-06-28 DIAGNOSIS — R202 Paresthesia of skin: Secondary | ICD-10-CM

## 2018-06-28 DIAGNOSIS — E538 Deficiency of other specified B group vitamins: Secondary | ICD-10-CM

## 2018-06-28 DIAGNOSIS — R531 Weakness: Secondary | ICD-10-CM

## 2018-06-28 LAB — VITAMIN B12: Vitamin B-12: 402 pg/mL (ref 211–911)

## 2018-06-28 NOTE — Patient Instructions (Addendum)
I will refer you to PT to work on your strength  We are going to check your B12 level I will contact your neurologist about the details of your MRI scan and will get this set up asap - I will also query him about trying the Requip medication we mentioned for your feet symptoms  Please let me know if any changes or worsening of your symptoms

## 2018-06-29 ENCOUNTER — Telehealth: Payer: Self-pay | Admitting: Diagnostic Neuroimaging

## 2018-06-29 NOTE — Telephone Encounter (Signed)
[facility] Auth: [id number] (exp. 06/29/18 to 07/25/18) order sent to [facility]. They will reach out to the pt to schedule.

## 2018-07-06 ENCOUNTER — Encounter: Payer: Self-pay | Admitting: Family Medicine

## 2018-07-09 ENCOUNTER — Encounter: Payer: Self-pay | Admitting: Physical Therapy

## 2018-07-10 ENCOUNTER — Encounter: Payer: Self-pay | Admitting: Family Medicine

## 2018-07-11 ENCOUNTER — Ambulatory Visit: Payer: Commercial Managed Care - PPO | Admitting: Physical Therapy

## 2018-07-18 ENCOUNTER — Encounter: Payer: Self-pay | Admitting: Physical Therapy

## 2018-07-18 ENCOUNTER — Ambulatory Visit: Payer: Commercial Managed Care - PPO | Admitting: Physical Therapy

## 2018-07-18 DIAGNOSIS — R531 Weakness: Secondary | ICD-10-CM | POA: Diagnosis not present

## 2018-07-18 DIAGNOSIS — M25571 Pain in right ankle and joints of right foot: Secondary | ICD-10-CM | POA: Diagnosis not present

## 2018-07-18 DIAGNOSIS — M25572 Pain in left ankle and joints of left foot: Secondary | ICD-10-CM

## 2018-07-18 NOTE — Therapy (Signed)
[facility] [address] [address] [place], [place], [address] Phone: [phone]   Fax:  [phone]  Physical Therapy Evaluation  Patient Details  Name: Dinah Beerseel K Leamy MRN: [id number] Date of Birth: 02-14-1981 Referring Provider (PT): Abbe AmsterdamJessica Copland   Encounter Date: 07/18/2018  PT End of Session - 07/18/18 1242    Visit Number  1    Number of Visits  12    Date for PT Re-Evaluation  08/29/18    Authorization Type  UHC    PT Start Time  1145    PT Stop Time  1229    PT Time Calculation (min)  44 min    Activity Tolerance  Patient tolerated treatment well       Past Medical History:  Diagnosis Date  . Allergy     History reviewed. No pertinent surgical history.  There were no vitals filed for this visit.   Subjective Assessment - 07/18/18 1142    Subjective  Pt states no incident to report. Has seen PCP and Neurologist. Having MRI on 07/23/18. Pt works full time at desk, computer, does get up and down. He states onset of symptoms in August, as tingling and pain in Bil feet. Pt states mild swelling and redness in feet at end of day, and with increased activity. He has ceased his regular stretching and treadmill walking due to pain and symptoms. He denies any pain in neck, back, or LEs. He has mild tingling into Hands as well, and feels mild and increasing weakness in UE and LEs when trying to lift, carry, etc. He has not had previous injury to neck, back, feet, etc. Pt has had negative nerve conduction testing.      Currently in Pain?  Yes    Pain Score  3     Pain Location  Foot    Pain Orientation  Left;Right    Pain Descriptors / Indicators  Burning;Tingling    Pain Type  Acute pain    Pain Onset  More than a month ago    Pain Frequency  Intermittent    Aggravating Factors   increased activity    Pain Relieving Factors  rest         [facility] PT Assessment - 07/18/18 0001      Assessment   Medical Diagnosis  Muscle Weakness    Referring Provider (PT)  Jessica Copland    Onset Date/Surgical Date  02/23/18    Prior Therapy  No      Precautions   Precautions  None      Restrictions   Weight Bearing Restrictions  No      Balance Screen   Has the patient fallen in the past 6 months  No      Prior Function   Level of Independence  Independent      Cognition   Overall Cognitive Status  Within Functional Limits for tasks assessed      ROM / Strength   AROM / PROM / Strength  AROM;Strength      AROM   Overall AROM Comments  ROM: WNL for UE and LE, and toes      Strength   Overall Strength Comments  Hips: 4-/5; Knees: 4/5;  Shoulders: 4/5;  Ankle: 4/5 bil;  Great toe: 4-/5 bilaterally;  Grip: L: 50lb,  R: 55lb.       Special Tests   Other special tests  SLR with ankle pump: Increases tingling in foot,  bilaterally.  Objective measurements completed on examination: See above findings.      [facility] Adult PT Treatment/Exercise - 07/18/18 0001      Ambulation/Gait   Gait Comments  --   SLS: ~10 sec bilaterally     Exercises   Exercises  Knee/Hip;Ankle      Knee/Hip Exercises: Standing   Hip Flexion  20 reps;Both;Knee bent    Hip Abduction  10 reps;Both      Knee/Hip Exercises: Seated   Long Arc Quad  20 reps      Knee/Hip Exercises: Supine   Bridges  10 reps    Straight Leg Raises  20 reps;Both      Knee/Hip Exercises: Sidelying   Hip ABduction  20 reps;Both      Ankle Exercises: Seated   Ankle Circles/Pumps  20 reps    Heel Raises  20 reps             PT Education - 07/18/18 1220    Education Details  HEP, PT POC     Person(s) Educated  Patient    Methods  Explanation;Handout;Demonstration;Verbal cues    Comprehension  Need further instruction;Verbalized understanding;Returned demonstration       PT Short Term Goals - 07/18/18 1250      PT SHORT TERM GOAL #1   Title  Pt to be independent with initial HEp    Time  3    Period  Weeks    Status   New    Target Date  08/08/18        PT Long Term Goals - 07/18/18 1251      PT LONG TERM GOAL #1   Title  Pt to be independent with final HEP for strength.     Time  6    Period  Weeks    Status  New    Target Date  08/29/18      PT LONG TERM GOAL #2   Title  Pt to demo increased strength of Bil LEs and UEs, to at least 4+/5 to improve ability for IADLS.     Time  6    Period  Weeks    Status  New    Target Date  08/29/18      PT LONG TERM GOAL #3   Title  Pt to report decreased pain in Bil feet, to 0-2/10 with activity and walking.     Time  6    Period  Weeks    Status  New    Target Date  08/29/18      PT LONG TERM GOAL #4   Title  Pt to demo improved walking mechanics to be WNL for pt age, and be independent with walking program.     Time  6    Period  Weeks    Status  New    Target Date  08/29/18             Plan - 07/18/18 1242    Clinical Impression Statement  Pt presents with primary complaint of increased pain and tingling in bilateral feet, as well as general weakness. Pt does demonstrate weakness in grip and LEs for his age. He does not have swelling in feet today during visit, but reports this happening daily with increased activity. He has onset of pain with increased movement, ther ex and gait during session today. He has decreased gait mechanics and endurance, due to pain. He has decreased LE stability and ability for SLS for his  age. Discussed with pt need to follow up with testing and Neurologist for determination of cause of symptoms. Pt to benefit from skilled PT to improve muscle weakness in LE, UE, core, and grip, as well as to improve gait mechanics, endurance, and to teach HEP.     Clinical Presentation  Stable    Clinical Decision Making  Low    Rehab Potential  Good    Clinical Impairments Affecting Rehab Potential  Pt only able to attend PT 1xwk or every other week, due to co-pay.     PT Frequency  2x / week    PT Duration  6 weeks    PT  Treatment/Interventions  ADLs/Self Care Home Management;Cryotherapy;Electrical Stimulation;Iontophoresis 4mg /ml Dexamethasone;Functional mobility training;Stair training;Gait training;Ultrasound;Traction;Moist Heat;Therapeutic activities;Therapeutic exercise;Balance training;Neuromuscular re-education;Patient/family education;Passive range of motion;Manual techniques;Dry needling;Taping;Spinal Manipulations;Joint Manipulations    PT Next Visit Plan  Progress HEP    Consulted and Agree with Plan of Care  Patient       Patient will benefit from skilled therapeutic intervention in order to improve the following deficits and impairments:  Abnormal gait, Difficulty walking, Impaired UE functional use, Decreased activity tolerance, Pain, Decreased balance, Decreased mobility, Decreased strength  Visit Diagnosis: Weakness generalized  Pain in left ankle and joints of left foot  Pain in right ankle and joints of right foot     Problem List Patient Active Problem List   Diagnosis Date Noted  . G E R D 06/30/2010  . COUGH 06/30/2010    Sedalia Muta, PT, DPT 12:59 PM  07/18/18    [facility] [address] [place], [place], [address] Phone: [phone]   Fax:  [phone]  Name: LEXANDER TREMBLAY MRN: [id number] Date of Birth: July 12, 1981

## 2018-07-18 NOTE — Patient Instructions (Signed)
Access Code: [email or web address]   URL: [email or web address]  Date: 07/18/2018  Prepared by: Sedalia MutaLauren Bo Teicher   Exercises  Supine Bridge - 10 reps - 2 sets - 2x daily  Straight Leg Raise - 10 reps - 2 sets - 2x daily  Sidelying Hip Abduction - 10 reps - 2 sets - 2x daily  Seated Knee Extension AROM - 10 reps - 2 sets - 2x daily  Seated Ankle Pumps on Table - 10 reps - 2 sets - 2x daily  Seated Toe Curl - 10 reps - 2 sets - 2x daily  Standing March - 10 reps - 2 sets - 2x daily  Standing Hip Abduction - 10 reps - 2 sets - 2x daily  Seated Heel Raise - 10 reps - 2 sets - 2x daily

## 2018-07-23 ENCOUNTER — Ambulatory Visit
Admission: RE | Admit: 2018-07-23 | Discharge: 2018-07-23 | Disposition: A | Discharge status: Home / Self Care | Payer: Commercial Managed Care - PPO | Source: Ambulatory Visit | Attending: Diagnostic Neuroimaging | Admitting: Diagnostic Neuroimaging

## 2018-07-23 DIAGNOSIS — R200 Anesthesia of skin: Secondary | ICD-10-CM | POA: Diagnosis not present

## 2018-07-23 DIAGNOSIS — R202 Paresthesia of skin: Secondary | ICD-10-CM

## 2018-07-23 MED ORDER — GADOBENATE DIMEGLUMINE 529 MG/ML IV SOLN
14.0000 mL | Freq: Once | INTRAVENOUS | Status: AC | PRN
  Administered 2018-07-23: 14 mL via INTRAVENOUS

## 2018-08-01 ENCOUNTER — Encounter: Payer: Commercial Managed Care - PPO | Admitting: Physical Therapy

## 2018-08-01 NOTE — Telephone Encounter (Signed)
-----   Message from Vikram R Penumalli, MD sent at 07/31/2018 12:22 AM EST ----- Unremarkable imaging results. Please call patient. Continue current plan. -VRP 

## 2018-08-08 ENCOUNTER — Ambulatory Visit: Payer: Commercial Managed Care - PPO | Admitting: Physical Therapy

## 2018-08-08 ENCOUNTER — Encounter: Payer: Self-pay | Admitting: Physical Therapy

## 2018-08-08 DIAGNOSIS — M25572 Pain in left ankle and joints of left foot: Secondary | ICD-10-CM | POA: Diagnosis not present

## 2018-08-08 DIAGNOSIS — R531 Weakness: Secondary | ICD-10-CM | POA: Diagnosis not present

## 2018-08-08 DIAGNOSIS — M25571 Pain in right ankle and joints of right foot: Secondary | ICD-10-CM | POA: Diagnosis not present

## 2018-08-08 NOTE — Therapy (Signed)
[facility] [address] [place], [place], [address] Phone: [phone]   Fax:  [phone]  Physical Therapy Treatment  Patient Details  Name: Joe Ray MRN: [id number] Date of Birth: [birth year] Referring Provider (PT): Abbe Amsterdam   Encounter Date: 08/08/2018  PT End of Session - 08/08/18 1618    Visit Number  2    Number of Visits  12    Date for PT Re-Evaluation  08/29/18    Authorization Type  UHC    PT Start Time  1602    PT Stop Time  1648    PT Time Calculation (min)  46 min    Activity Tolerance  Patient tolerated treatment well       Past Medical History:  Diagnosis Date  . Allergy     History reviewed. No pertinent surgical history.  There were no vitals filed for this visit.  Subjective Assessment - 08/08/18 1614    Subjective  Pt states continued pain/tingling in bilateral feet. He has also noticed increased instances of foot cramping where he can not move it. He states increased pain with increased activity/walking/standing.     Currently in Pain?  Yes    Pain Score  5     Pain Orientation  Left;Right    Pain Descriptors / Indicators  Burning;Tingling    Pain Type  Acute pain    Pain Onset  More than a month ago    Pain Frequency  Intermittent                       [facility] Adult PT Treatment/Exercise - 08/08/18 1606      Ambulation/Gait   Gait Comments  --      Exercises   Exercises  Knee/Hip;Ankle;Shoulder      Knee/Hip Exercises: Stretches   Active Hamstring Stretch  3 reps;30 seconds    Active Hamstring Stretch Limitations  seated    Other Knee/Hip Stretches  Childs pose stretch 30 sec x3;       Knee/Hip Exercises: Aerobic   Stationary Bike  L1 x 4 min, ceased due to pain      Knee/Hip Exercises: Standing   Hip Flexion  20 reps;Both;Knee bent    Hip Abduction  10 reps;Both    Forward Step Up  10 reps;Right    Forward Step Up Limitations  stopped due to increased pain in  L foot.       Knee/Hip Exercises: Seated   Long Arc Quad  --      Knee/Hip Exercises: Supine   Bridges  20 reps    Straight Leg Raises  20 reps;Both      Knee/Hip Exercises: Sidelying   Hip ABduction  20 reps;Both      Shoulder Exercises: Standing   Row  20 reps    Theraband Level (Shoulder Row)  Level 3 (Green)      Ankle Exercises: Seated   Ankle Circles/Pumps  20 reps    Heel Raises  20 reps               PT Short Term Goals - 07/18/18 1250      PT SHORT TERM GOAL #1   Title  Pt to be independent with initial HEp    Time  3    Period  Weeks    Status  New    Target Date  08/08/18        PT Long Term Goals -  07/18/18 1251      PT LONG TERM GOAL #1   Title  Pt to be independent with final HEP for strength.     Time  6    Period  Weeks    Status  New    Target Date  08/29/18      PT LONG TERM GOAL #2   Title  Pt to demo increased strength of Bil LEs and UEs, to at least 4+/5 to improve ability for IADLS.     Time  6    Period  Weeks    Status  New    Target Date  08/29/18      PT LONG TERM GOAL #3   Title  Pt to report decreased pain in Bil feet, to 0-2/10 with activity and walking.     Time  6    Period  Weeks    Status  New    Target Date  08/29/18      PT LONG TERM GOAL #4   Title  Pt to demo improved walking mechanics to be WNL for pt age, and be independent with walking program.     Time  6    Period  Weeks    Status  New    Target Date  08/29/18            Plan - 08/08/18 1654    Clinical Impression Statement  HEP reviewed and progressed today. Pt able to perform most ther ex on mat without increased symptoms. He has significant burning pain into feet L > R with standing activities, as well as bike today, and has to stop activity. No pain in back, hips, knees, just feet. Pt with Negative recent MRI. Discussed obtaining follow up with Neurologist as directed. Plan to progress as able. Pain limiting today.     Rehab Potential  Good     Clinical Impairments Affecting Rehab Potential  Pt only able to attend PT 1xwk or every other week, due to co-pay.     PT Frequency  2x / week    PT Duration  6 weeks    PT Treatment/Interventions  ADLs/Self Care Home Management;Cryotherapy;Electrical Stimulation;Iontophoresis 4mg /ml Dexamethasone;Functional mobility training;Stair training;Gait training;Ultrasound;Traction;Moist Heat;Therapeutic activities;Therapeutic exercise;Balance training;Neuromuscular re-education;Patient/family education;Passive range of motion;Manual techniques;Dry needling;Taping;Spinal Manipulations;Joint Manipulations    PT Next Visit Plan  Progress HEP    Consulted and Agree with Plan of Care  Patient       Patient will benefit from skilled therapeutic intervention in order to improve the following deficits and impairments:  Abnormal gait, Difficulty walking, Impaired UE functional use, Decreased activity tolerance, Pain, Decreased balance, Decreased mobility, Decreased strength  Visit Diagnosis: Weakness generalized  Pain in left ankle and joints of left foot  Pain in right ankle and joints of right foot     Problem List Patient Active Problem List   Diagnosis Date Noted  . G E R D 06/30/2010  . COUGH 06/30/2010    Sedalia MutaLauren Sherria Riemann, PT, DPT 4:57 PM  08/08/18    [facility] [address] [place], [place], [address] Phone: [phone]   Fax:  [phone]  Name: Joe Ray MRN: [id number] Date of Birth: 08/14/1980

## 2018-08-15 ENCOUNTER — Encounter: Payer: Commercial Managed Care - PPO | Admitting: Physical Therapy

## 2018-08-23 ENCOUNTER — Ambulatory Visit: Payer: Commercial Managed Care - PPO | Admitting: Physical Therapy

## 2018-08-23 ENCOUNTER — Encounter: Payer: Self-pay | Admitting: Physical Therapy

## 2018-08-23 DIAGNOSIS — R531 Weakness: Secondary | ICD-10-CM

## 2018-08-23 DIAGNOSIS — M25572 Pain in left ankle and joints of left foot: Secondary | ICD-10-CM | POA: Diagnosis not present

## 2018-08-23 DIAGNOSIS — M25571 Pain in right ankle and joints of right foot: Secondary | ICD-10-CM | POA: Diagnosis not present

## 2018-08-23 NOTE — Patient Instructions (Signed)
Access Code: [email or web address]   URL: [email or web address]  Date: 08/23/2018  Prepared by: Sedalia Muta   Exercises  Supine Bridge - 10 reps - 2 sets - 2x daily  Straight Leg Raise - 10 reps - 2 sets - 2x daily  Sidelying Hip Abduction - 10 reps - 2 sets - 2x daily  Seated Knee Extension AROM - 10 reps - 2 sets - 2x daily  Seated Ankle Pumps on Table - 10 reps - 2 sets - 2x daily  Seated Toe Curl - 10 reps - 2 sets - 2x daily  Standing March - 10 reps - 2 sets - 2x daily  Standing Hip Abduction - 10 reps - 2 sets - 2x daily  Standing Heel Raise - 10 reps - 2 sets - 2x daily  Standing Row with Resistance - 10 reps - 2 sets - 2x daily  Supine Single Knee to Chest Stretch - 3 reps - 30 hold - 2x daily  Childs Pose - 3 reps - 30 hold - 2x daily  Gastroc Stretch on Wall - 3 reps - 30 hold - 2x daily  Supine Lower Trunk Rotation - 10 reps - 10 hold - 2x daily  Standing Repeated Hip Abduction with Resistance - 10 reps - 2 sets - 2x daily  Curl Up with Reach - 10 reps - 2 sets - 2x daily

## 2018-08-23 NOTE — Therapy (Addendum)
[facility] [address] [place], [place], [address] Phone: [phone]   Fax:  [phone]  Physical Therapy Treatment  Patient Details  Name: Joe Ray MRN: [id number] Date of Birth: 1981/05/12 Referring Provider (PT): Lamar Blinks   Encounter Date: 08/23/2018  PT End of Session - 08/23/18 1615    Visit Number  3    Number of Visits  12    Date for PT Re-Evaluation  08/29/18    Authorization Type  UHC    PT Start Time  1916    PT Stop Time  1650    PT Time Calculation (min)  41 min    Activity Tolerance  Patient tolerated treatment well       Past Medical History:  Diagnosis Date  . Allergy     History reviewed. No pertinent surgical history.  There were no vitals filed for this visit.  Subjective Assessment - 08/23/18 1610    Subjective  Pt states mild decrease in symptoms. He continues to have increased pain with increased activity, L>R, states "pins and needles" feeling and tiredness in feet.  He states back tightenss and soreness from sleeping wtih legs elevated. He has been able to exercise only minimally. Pt also states that co pay is getting too expensive for him, and he would like to hold PT for now, and continue HEP.     Currently in Pain?  Yes    Pain Score  4     Pain Location  Foot    Pain Orientation  Right;Left    Pain Descriptors / Indicators  Burning;Tingling    Pain Type  Acute pain    Pain Onset  More than a month ago    Pain Frequency  Intermittent                       [facility] Adult PT Treatment/Exercise - 08/23/18 1616      Exercises   Exercises  Knee/Hip;Ankle;Shoulder      Knee/Hip Exercises: Stretches   Active Hamstring Stretch  3 reps;30 seconds    Active Hamstring Stretch Limitations  seated    Gastroc Stretch  2 reps;30 seconds    Gastroc Stretch Limitations  at wall    Other Knee/Hip Stretches  Childs pose stretch 30 sec x3; Lower trunk rotation x10 , 10 sec holds;  Single knee to chest 30 sec x3;       Knee/Hip Exercises: Aerobic   Stationary Bike  L1 x 8: Pain 3-4/10 in L, no pain on R;       Knee/Hip Exercises: Standing   Hip Flexion  --    Hip Abduction  10 reps;Both;2 sets    Abduction Limitations  RTB    Forward Step Up  --    Forward Step Up Limitations  --      Knee/Hip Exercises: Supine   Bridges  20 reps    Straight Leg Raises  20 reps;Both    Other Supine Knee/Hip Exercises  modified cruch x20;  quadruped LE x15;       Knee/Hip Exercises: Sidelying   Hip ABduction  --      Shoulder Exercises: Standing   Row  --    Theraband Level (Shoulder Row)  --      Ankle Exercises: Seated   Ankle Circles/Pumps  --    Heel Raises  --             PT Education -  08/23/18 1615    Education Details  HEP reviewed    Person(s) Educated  Patient    Methods  Explanation    Comprehension  Verbalized understanding       PT Short Term Goals - 08/23/18 2202      PT SHORT TERM GOAL #1   Title  Pt to be independent with initial HEp    Time  3    Period  Weeks    Status  Achieved        PT Long Term Goals - 08/23/18 2202      PT LONG TERM GOAL #1   Title  Pt to be independent with final HEP for strength.     Time  6    Period  Weeks    Status  Achieved      PT LONG TERM GOAL #2   Title  Pt to demo increased strength of Bil LEs and UEs, to at least 4+/5 to improve ability for IADLS.     Time  6    Period  Weeks    Status  On-going      PT LONG TERM GOAL #3   Title  Pt to report decreased pain in Bil feet, to 0-2/10 with activity and walking.     Time  6    Period  Weeks    Status  On-going      PT LONG TERM GOAL #4   Title  Pt to demo improved walking mechanics to be WNL for pt age, and be independent with walking program.     Time  6    Period  Weeks    Status  On-going            Plan - 08/23/18 2204    Clinical Impression Statement  Pt with continued onset of increased pain, unclear triggers besides  increased exercise/activity. Pt with slight improvement of ability for ther ex and endurance for ther ex today. Pt would benefit from continued care. Pt requests to hold PT at this time, and continue HEP. Final HEP updated and reviewed in detail today. Pt with good understanding. Will hold for 2 weeks, then d/c if pt does not return.     Rehab Potential  Good    Clinical Impairments Affecting Rehab Potential  Pt only able to attend PT 1xwk or every other week, due to co-pay.     PT Frequency  2x / week    PT Duration  6 weeks    PT Treatment/Interventions  ADLs/Self Care Home Management;Cryotherapy;Electrical Stimulation;Iontophoresis '4mg'$ /ml Dexamethasone;Functional mobility training;Stair training;Gait training;Ultrasound;Traction;Moist Heat;Therapeutic activities;Therapeutic exercise;Balance training;Neuromuscular re-education;Patient/family education;Passive range of motion;Manual techniques;Dry needling;Taping;Spinal Manipulations;Joint Manipulations    PT Next Visit Plan  Progress HEP    Consulted and Agree with Plan of Care  Patient       Patient will benefit from skilled therapeutic intervention in order to improve the following deficits and impairments:  Abnormal gait, Difficulty walking, Impaired UE functional use, Decreased activity tolerance, Pain, Decreased balance, Decreased mobility, Decreased strength  Visit Diagnosis: Weakness generalized  Pain in left ankle and joints of left foot  Pain in right ankle and joints of right foot     Problem List Patient Active Problem List   Diagnosis Date Noted  . G E R D 06/30/2010  . COUGH 06/30/2010    Lyndee Hensen, PT, DPT 10:18 PM  08/23/18    [place], [place], [address] Phone:  [phone]   Fax:  [phone]  Name: Joe Ray MRN: [id number] Date of Birth: Jan 19, 1981     PHYSICAL THERAPY DISCHARGE SUMMARY  Visits from Start of Care:  3   Plan: Patient agrees to discharge.  Patient goals were not met. Patient is being discharged due to not returning since the last visit.  ?????     Pt states that he can not return to PT due to cost.   Lyndee Hensen, PT, DPT 9:43 AM  10/27/18

## 2018-09-25 NOTE — Progress Notes (Addendum)
[facility] [address] [place], [place] [address] [phone]  Date:  09/27/2018   Name:  Joe Ray   DOB:  Dec 05, 1980   MRN:  [id number]  PCP:  Joe Cables, MD    Chief Complaint: Foot Pain (bilateral foot pain, numbness and tingling,no better) and Sinusitis (feeling better, sneezing, taking mucinex and has helped,coughing off and on, drainage, headache, congestion in chest, no fever)   History of Present Illness:  Joe Ray is a 38 y.o. very pleasant male patient who presents with the following:  I saw him most recently in December.  Earlier last year I had seen him for numbness and tingling in his hands and feet.  We referred him to neurology, EMG studies were normal He also did ABI testing which was basically normal In December I checked his B12, it was normal.  Neurology planned to do MRI of his head. This is done on December 29, and was normal  He had been going to physical therapy and attempting to increase his strength He did it for a few weeks, but stopped as it was expensive, he continue exercises at home He does note that the frequency of pain in his feet is better- but he still does get the pains Pains occur mostly at night Elevating his feet on pillows while in bed does help   He feels like he is 50% better He may get pains at night 3-4x a week instead of every day-this is an improvement  He also has concern of sinus congestion which she has noticed for about 10 days.  No fever, but he has sinus pressure, sneezing, some cough He does feel that he is actually getting better in this regard, his main concern is that his chest may still feel tight at times No chest pain  Also, Joe Ray admits that he is having problems with early ejaculation.  He is not married, but is sexually active at times.  He has not been sexually active as regularly recently due to his foot problems.  However over the last  several months he has noticed that he will climax much earlier than he would like when he is active with a partner. Erections are still okay  Patient Active Problem List   Diagnosis Date Noted  . G E R D 06/30/2010  . COUGH 06/30/2010    Past Medical History:  Diagnosis Date  . Allergy     No past surgical history on file.  Social History   Tobacco Use  . Smoking status: Never Smoker  . Smokeless tobacco: Never Used  Substance Use Topics  . Alcohol use: Never    Frequency: Never  . Drug use: Never    Family History  Problem Relation Age of Onset  . Diabetes Father     Allergies  Allergen Reactions  . Amoxicillin Diarrhea and Rash    Medication list has been reviewed and updated.  Current Outpatient Medications on File Prior to Visit  Medication Sig Dispense Refill  . Cholecalciferol (VITAMIN D-3) 1000 units CAPS Take by mouth.    . Methylcobalamin 1000 MCG TBDP Take by mouth.    . Multiple Vitamin (MULTIVITAMIN) tablet Take 1 tablet by mouth daily.     No current facility-administered medications on file prior to visit.     Review of Systems:  As per HPI- otherwise negative. Walking does not consistently trigger any pain-symptoms do not  sound like claudication  Physical Examination: Vitals:   09/27/18 0949  BP: 110/82  Pulse: 76  Resp: 16  Temp: 98 F (36.7 C)  SpO2: 97%   Vitals:   09/27/18 0949  Weight: 162 lb (73.5 kg)  Height: 5\' 8"  (1.727 m)   Body mass index is 24.63 kg/m. Ideal Body Weight: Weight in (lb) to have BMI = 25: 164.1  GEN: WDWN, NAD, Non-toxic, A & O x 3, normal weight, looks well HEENT: Atraumatic, Normocephalic. Neck supple. No masses, No LAD. Bilateral TM wnl, oropharynx normal.  PEERL,EOMI.   Ears and Nose: No external deformity. CV: RRR, No M/G/R. No JVD. No thrill. No extra heart sounds. PULM: CTA B, no wheezes, crackles, rhonchi. No retractions. No resp. distress. No accessory muscle use. EXTR: No c/c/e NEURO  Normal gait.  PSYCH: Normally interactive. Conversant. Not depressed or anxious appearing.  Calm demeanor.  I am able to palpate the posterior tibial pulse in both feet, but cannot find a dorsalis pedis pulse.  Otherwise feet are warm and well-perfused, normal cap refill, normal monofilament sensation  Assessment and Plan: Vegetarian diet - Plan: B12  Screening for hyperlipidemia - Plan: Lipid panel  Screening for HIV (human immunodeficiency virus) - Plan: HIV Antibody (routine testing w rflx)  Pain in both feet - Plan: Comprehensive metabolic panel, gabapentin (NEURONTIN) 100 MG capsule  Cough - Plan: doxycycline (VIBRAMYCIN) 100 MG capsule, albuterol (PROVENTIL HFA;VENTOLIN HFA) 108 (90 Base) MCG/ACT inhaler, CBC  Orgasm disorder - Plan: FLUoxetine (PROZAC) 10 MG tablet  Today with a few concerns.  And tingling in both feet for more than 6 months now.  See neurology, they ordered an MRI of his brain which was normal.  However he has not had a recent follow-up visit in the office. We decided to try gabapentin for him at bedtime to see if this may be helpful.  We will start 100 mg, can gradually increase to 300 if he likes I did also encourage him to call neurology and set up a follow-up appointment He has noted a recent upper respiratory infection, overall this is getting better.  However he still notes some chest tightness and cough.  Gave him an albuterol inhaler to use as needed.  Also doxycycline prescription to hold, and fill if needed over the next few days Finally, he has concern of early orgasm.  Decided start him on a low dose of fluoxetine for this issue.  We will let me know if this is helpful-we can go up on the dose if need be  Will plan further follow- up pending labs.   Signed Abbe Amsterdam, MD  Received his labs-message to patient  Results for orders placed or performed in visit on 09/27/18  Comprehensive metabolic panel  Result Value Ref Range   Sodium 138 135 -  145 mEq/L   Potassium 4.2 3.5 - 5.1 mEq/L   Chloride 102 96 - 112 mEq/L   CO2 27 19 - 32 mEq/L   Glucose, Bld 75 70 - 99 mg/dL   BUN 14 6 - 23 mg/dL   Creatinine, Ser 3.41 0.40 - 1.50 mg/dL   Total Bilirubin 0.4 0.2 - 1.2 mg/dL   Alkaline Phosphatase 89 39 - 117 U/L   AST 20 0 - 37 U/L   ALT 28 0 - 53 U/L   Total Protein 7.1 6.0 - 8.3 g/dL   Albumin 4.6 3.5 - 5.2 g/dL   Calcium 9.6 8.4 - 93.7 mg/dL   GFR 902.40 >  60.00 mL/min  Lipid panel  Result Value Ref Range   Cholesterol 198 0 - 200 mg/dL   Triglycerides 546.5 (H) 0.0 - 149.0 mg/dL   HDL 03.54 >65.68 mg/dL   VLDL 12.7 0.0 - 51.7 mg/dL   LDL Cholesterol 001 (H) 0 - 99 mg/dL   Total CHOL/HDL Ratio 5    NonHDL 154.83   B12  Result Value Ref Range   Vitamin B-12 759 211 - 911 pg/mL  CBC  Result Value Ref Range   WBC 8.6 4.0 - 10.5 K/uL   RBC 5.31 4.22 - 5.81 Mil/uL   Platelets 295.0 150.0 - 400.0 K/uL   Hemoglobin 15.0 13.0 - 17.0 g/dL   HCT 74.9 44.9 - 67.5 %   MCV 83.1 78.0 - 100.0 fl   MCHC 34.0 30.0 - 36.0 g/dL   RDW 91.6 38.4 - 66.5 %

## 2018-09-27 ENCOUNTER — Encounter: Payer: Self-pay | Admitting: Family Medicine

## 2018-09-27 ENCOUNTER — Ambulatory Visit: Payer: Commercial Managed Care - PPO | Admitting: Family Medicine

## 2018-09-27 VITALS — BP 110/82 | HR 76 | Temp 98.0°F | Resp 16 | Ht 68.0 in | Wt 162.0 lb

## 2018-09-27 DIAGNOSIS — Z13220 Encounter for screening for lipoid disorders: Secondary | ICD-10-CM | POA: Diagnosis not present

## 2018-09-27 DIAGNOSIS — R05 Cough: Secondary | ICD-10-CM | POA: Diagnosis not present

## 2018-09-27 DIAGNOSIS — Z114 Encounter for screening for human immunodeficiency virus [HIV]: Secondary | ICD-10-CM

## 2018-09-27 DIAGNOSIS — M79672 Pain in left foot: Secondary | ICD-10-CM | POA: Diagnosis not present

## 2018-09-27 DIAGNOSIS — M79671 Pain in right foot: Secondary | ICD-10-CM | POA: Diagnosis not present

## 2018-09-27 DIAGNOSIS — Z789 Other specified health status: Secondary | ICD-10-CM

## 2018-09-27 DIAGNOSIS — R059 Cough, unspecified: Secondary | ICD-10-CM

## 2018-09-27 DIAGNOSIS — F524 Premature ejaculation: Secondary | ICD-10-CM

## 2018-09-27 LAB — COMPREHENSIVE METABOLIC PANEL
ALT: 28 U/L (ref 0–53)
AST: 20 U/L (ref 0–37)
Albumin: 4.6 g/dL (ref 3.5–5.2)
Alkaline Phosphatase: 89 U/L (ref 39–117)
BUN: 14 mg/dL (ref 6–23)
CO2: 27 mEq/L (ref 19–32)
Calcium: 9.6 mg/dL (ref 8.4–10.5)
Chloride: 102 mEq/L (ref 96–112)
Creatinine, Ser: 0.74 mg/dL (ref 0.40–1.50)
GFR: 118.68 mL/min (ref >60.00)
Glucose, Bld: 75 mg/dL (ref 70–99)
Potassium: 4.2 mEq/L (ref 3.5–5.1)
Sodium: 138 mEq/L (ref 135–145)
Total Bilirubin: 0.4 mg/dL (ref 0.2–1.2)
Total Protein: 7.1 g/dL (ref 6.0–8.3)

## 2018-09-27 LAB — CBC
HCT: 44.1 % (ref 39.0–52.0)
Hemoglobin: 15 g/dL (ref 13.0–17.0)
MCHC: 34 g/dL (ref 30.0–36.0)
MCV: 83.1 fl (ref 78.0–100.0)
PLATELETS: 295 10*3/uL (ref 150.0–400.0)
RBC: 5.31 Mil/uL (ref 4.22–5.81)
RDW: 12.8 % (ref 11.5–15.5)
WBC: 8.6 10*3/uL (ref 4.0–10.5)

## 2018-09-27 LAB — VITAMIN B12: Vitamin B-12: 759 pg/mL (ref 211–911)

## 2018-09-27 LAB — LIPID PANEL
Cholesterol: 198 mg/dL (ref 0–200)
HDL: 43.5 mg/dL (ref >39.00)
LDL Cholesterol: 124 mg/dL — ABNORMAL HIGH (ref 0–99)
NonHDL: 154.83
Total CHOL/HDL Ratio: 5
Triglycerides: 152 mg/dL — ABNORMAL HIGH (ref 0.0–149.0)
VLDL: 30.4 mg/dL (ref 0.0–40.0)

## 2018-09-27 MED ORDER — GABAPENTIN 100 MG PO CAPS: 100.0000 mg | ORAL_CAPSULE | Freq: Every day | ORAL | 3 refills | Status: DC

## 2018-09-27 MED ORDER — ALBUTEROL SULFATE HFA 108 (90 BASE) MCG/ACT IN AERS: 2.0000 | INHALATION_SPRAY | Freq: Four times a day (QID) | RESPIRATORY_TRACT | 0 refills | Status: DC | PRN

## 2018-09-27 MED ORDER — FLUOXETINE HCL 10 MG PO TABS: 10.0000 mg | ORAL_TABLET | Freq: Every day | ORAL | 6 refills | Status: DC

## 2018-09-27 MED ORDER — DOXYCYCLINE HYCLATE 100 MG PO CAPS: 100.0000 mg | ORAL_CAPSULE | Freq: Two times a day (BID) | ORAL | 0 refills | Status: DC

## 2018-09-27 NOTE — Patient Instructions (Signed)
Good to see you today!  I will be in touch with your labs ASAP We do need to give you a tetanus booster, but have held off due to your foot pain.  As soon as this issue is resolved we can give you the shot, but do get a booster of you have any sort of injury, especially a dirty wound or wound that occurs outside For your foot pain, let us try 100 mg of gabapentin at bedtime.  You can increase to 200, and then 300 mg over the course of a couple of weeks if needed  For your other concern, I gave you 10 mg of Prozac.  You may try this if you like-I would recommend starting the gabapentin a couple of weeks before hand, before you are at the Prozac.  This way we can determine the source of any side effects  I gave you an albuterol inhaler to use for any chest tightness.  Also prescription for doxycycline to hold.  If your sinus and chest symptoms do not continue to improve, please fill and use this antibiotic

## 2018-09-28 LAB — HIV ANTIBODY (ROUTINE TESTING W REFLEX): HIV 1&2 Ab, 4th Generation: NONREACTIVE

## 2018-11-20 ENCOUNTER — Encounter: Payer: Self-pay | Admitting: Family Medicine

## 2018-11-24 ENCOUNTER — Encounter: Payer: Self-pay | Admitting: Family Medicine

## 2018-11-26 ENCOUNTER — Other Ambulatory Visit: Payer: Self-pay | Admitting: Family Medicine

## 2018-11-26 DIAGNOSIS — M79672 Pain in left foot: Secondary | ICD-10-CM

## 2018-11-26 DIAGNOSIS — M79671 Pain in right foot: Secondary | ICD-10-CM

## 2018-11-26 MED ORDER — GABAPENTIN 100 MG PO CAPS: ORAL_CAPSULE | ORAL | 3 refills | Status: DC

## 2018-12-11 ENCOUNTER — Encounter: Payer: Self-pay | Admitting: Family Medicine

## 2018-12-20 NOTE — Telephone Encounter (Signed)
Pt called about billing issue and wanted to inquire about it before he received another bill notice/ please follow up with Pt

## 2018-12-21 NOTE — Telephone Encounter (Signed)
Unfortunately I don't do anything with billing the referral was placed correctly so I think his issue has to do with how they charged him that would have to be Carollee Herter I will forward this email to her.

## 2019-01-03 ENCOUNTER — Encounter: Payer: Self-pay | Admitting: Family Medicine

## 2019-01-22 ENCOUNTER — Encounter: Payer: Self-pay | Admitting: Family Medicine

## 2019-02-02 ENCOUNTER — Telehealth: Payer: Self-pay | Admitting: Family Medicine

## 2019-02-02 NOTE — Telephone Encounter (Signed)
Patient is calling he spoke [place] in Billing and she is going to contact the practice regarding some billing codes for Date of Service 08/04/2018. Patient states that he is in the last billing cycle prior to going to collection.

## 2019-02-05 NOTE — Telephone Encounter (Signed)
You will have to ask Lauren what she billed for the visit.

## 2019-02-05 NOTE — Telephone Encounter (Signed)
She billed a [id number].

## 2019-02-05 NOTE — Telephone Encounter (Signed)
Colletta Maryland called [employer] and received a retroactive prior auth. She has notified patient.

## 2019-02-05 NOTE — Telephone Encounter (Signed)
I know what those codes are - do I need to do something with this?

## 2019-02-05 NOTE — Telephone Encounter (Signed)
Do you have any information on this PT referral authorization for DOS 07/18/18?

## 2019-03-02 ENCOUNTER — Other Ambulatory Visit: Payer: Self-pay

## 2019-03-03 NOTE — Progress Notes (Signed)
[facility] [address] [place], [place] [id number] [phone] [phone]  Date:  03/05/2019   Name:  Joe Ray   DOB:  1981/03/22   MRN:  [id number]  PCP:  Pearline Cablesopland, Kmya Placide C, MD    Chief Complaint: Foot Pain (effecting fingers, trouble gripping, chornic issue, worsened, tingling, goes numb, turn red, swelling) and Constipation (off and on couple of months)   History of Present Illness:  Joe Ray is a 38 y.o. very pleasant male patient who presents with the following:  Following up today- last seen by myself in March  He has had issues with numbness and tingling of his hands and feet, has been to see neurology and had ABI testing, MRI of his brain  See note from 09/27/2018 for more details: See neurology, they ordered an MRI of his brain which was normal.  However he has not had a recent follow-up visit in the office. We decided to try gabapentin for him at bedtime to see if this may be helpful.  We will start 100 mg, can gradually increase to 300 if he likes I did also encourage him to call neurology and set up a follow-up appointment He has noted a recent upper respiratory infection, overall this is getting better.  However he still notes some chest tightness and cough.  Gave him an albuterol inhaler to use as needed.  Also doxycycline prescription to hold, and fill if needed over the next few days Finally, he has concern of early orgasm.  Decided start him on a low dose of fluoxetine for this issue.  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 He notes that the hand and foot pain is still bothersome The foot pain was getting a bit better for a while but then came back again He is taking gabapentin mostly just at bedtime but sometimes at lunch- however it does make  him drowsy some of the time  He thinks gabapentin helps temporarily but does not resolve his sx   He is taking vitamins and watching his BP - BP tends to run on the low side with his BP for many years  He has noted constipation for the last several months which is unusual for him- he has noted this for about 2-3 months He has noted painful BM nearly every time he goes May hurt really badly sometimes He has not tried any medication for his so far In the past he has trended towards loose stools He has noted some dark and skinny stools Occasional abd pain He may notice blood in his stool or on the TP- has occurred several times  No vomiting   Wt Readings from Last 3 Encounters:  03/05/19 162 lb (73.5 kg)  09/27/18 162 lb (73.5 kg)  06/28/18 159 lb (72.1 kg)   Weight has been about the same, gained a few lbs   He continues to notice issues with his erections- no recent activity with a partner but he notes lack of spontaneous am erections.  He did not try the prozac I rx for premature ejaculation as of yet       Patient Active Problem List   Diagnosis Date Noted  . G E  R D 06/30/2010  . COUGH 06/30/2010    Past Medical History:  Diagnosis Date  . Allergy     No past surgical history on file.  Social History   Tobacco Use  . Smoking status: Never Smoker  . Smokeless tobacco: Never Used  Substance Use Topics  . Alcohol use: Never    Frequency: Never  . Drug use: Never    Family History  Problem Relation Age of Onset  . Diabetes Father     Allergies  Allergen Reactions  . Amoxicillin Diarrhea and Rash    Medication list has been reviewed and updated.  Current Outpatient Medications on File Prior to Visit  Medication Sig Dispense Refill  . albuterol (PROVENTIL HFA;VENTOLIN HFA) 108 (90 Base) MCG/ACT inhaler Inhale 2 puffs into the lungs every 6 (six) hours as needed for wheezing or shortness of breath. 1 Inhaler 0  . Cholecalciferol (VITAMIN D-3) 1000 units  CAPS Take by mouth.    . doxycycline (VIBRAMYCIN) 100 MG capsule Take 1 capsule (100 mg total) by mouth 2 (two) times daily. 20 capsule 0  . FLUoxetine (PROZAC) 10 MG tablet Take 1 tablet (10 mg total) by mouth daily. 30 tablet 6  . gabapentin (NEURONTIN) 100 MG capsule Take 100 mg am, 100 mg noon and up to 300 mg pm for foot pain. 150 capsule 3  . Methylcobalamin 1000 MCG TBDP Take by mouth.    . Multiple Vitamin (MULTIVITAMIN) tablet Take 1 tablet by mouth daily.     No current facility-administered medications on file prior to visit.     Review of Systems:  As per HPI- otherwise negative. No fever or chills   Physical Examination: Vitals:   03/05/19 1052 03/05/19 1123  BP: 106/72   Pulse: (!) 112 80  Resp: 16   Temp: 98.4 F (36.9 C)   SpO2: 100%    Vitals:   03/05/19 1052  Weight: 162 lb (73.5 kg)  Height: 5\' 8"  (1.727 m)   Body mass index is 24.63 kg/m. Ideal Body Weight: Weight in (lb) to have BMI = 25: 164.1  GEN: WDWN, NAD, Non-toxic, A & O x 3, normal weight, looks well  HEENT: Atraumatic, Normocephalic. Neck supple. No masses, No LAD.   Bilateral TM wnl, oropharynx normal.  PEERL,EOMI.   Ears and Nose: No external deformity. CV: RRR, No M/G/R. No JVD. No thrill. No extra heart sounds. PULM: CTA B, no wheezes, crackles, rhonchi. No retractions. No resp. distress. No accessory muscle use. ABD: S, NT, ND, +BS. No rebound. No HSM. EXTR: No c/c/e NEURO Normal gait.  Normal strength, DTR of all extremities  PSYCH: Normally interactive. Conversant. Not depressed or anxious appearing.  Calm demeanor.   BP Readings from Last 3 Encounters:  03/05/19 106/72  09/27/18 110/82  06/28/18 110/73     Assessment and Plan:   ICD-10-CM   1. Change in consistency of stool  R19.5 Ambulatory referral to Gastroenterology  2. Pain in both feet  M79.671    M79.672   3. Vegetarian diet  Z78.9   4. Numbness and tingling of foot  R20.0    R20.2   5. Difficulty attaining  erection  N52.9    Following up in a few concerns today. Change in stool consistency and blood in stools- new problem.  No family history of colon cancer as far she is aware.  I will refer to GI for evaluation, in the meantime encouraged to increase fiber, increase fluids, stool softener I have  sent a message to his neurologist about his persistent numbness and tingling in both hands and both feet.  Will ask if any further evaluation is indicated from neurology standpoint.  If not, may try changing gabapentin to Lyrica  For erection difficulty, plan to order a testosterone panel.  However, we will first see if neurology suggest any other lab work-patient need to return anyway-it is too late in the day to check testosterone  Follow-up: No follow-ups on file.  No orders of the defined types were placed in this encounter.  Orders Placed This Encounter  Procedures  . Ambulatory referral to Gastroenterology    @SIGN @    Signed Abbe AmsterdamJessica Trust Crago, MD

## 2019-03-05 ENCOUNTER — Encounter: Payer: Self-pay | Admitting: Gastroenterology

## 2019-03-05 ENCOUNTER — Encounter: Payer: Self-pay | Admitting: Family Medicine

## 2019-03-05 ENCOUNTER — Ambulatory Visit: Payer: Commercial Managed Care - PPO | Admitting: Family Medicine

## 2019-03-05 ENCOUNTER — Other Ambulatory Visit: Payer: Self-pay

## 2019-03-05 VITALS — BP 106/72 | HR 80 | Temp 98.4°F | Resp 16 | Ht 68.0 in | Wt 162.0 lb

## 2019-03-05 DIAGNOSIS — M79671 Pain in right foot: Secondary | ICD-10-CM

## 2019-03-05 DIAGNOSIS — N529 Male erectile dysfunction, unspecified: Secondary | ICD-10-CM

## 2019-03-05 DIAGNOSIS — Z789 Other specified health status: Secondary | ICD-10-CM

## 2019-03-05 DIAGNOSIS — R200 Anesthesia of skin: Secondary | ICD-10-CM | POA: Diagnosis not present

## 2019-03-05 DIAGNOSIS — R195 Other fecal abnormalities: Secondary | ICD-10-CM | POA: Diagnosis not present

## 2019-03-05 DIAGNOSIS — M79672 Pain in left foot: Secondary | ICD-10-CM

## 2019-03-05 DIAGNOSIS — R202 Paresthesia of skin: Secondary | ICD-10-CM

## 2019-03-05 NOTE — Patient Instructions (Signed)
Good to see you again today!  I will touch base with your neurologist and see if they have any other suggestions as far as eval for your hand and foot numbness.  If not, I would suggest we try trading gabapentin for pregabalin or lyrica- this may cause less sleepiness so you can take it more easily  I am going to refer you to GI for your stool changes In the meantime try increasing fiber and fluid intake, and add a daily stool softener such as docusate sodium to prevent hard and painful stools  We will want to check a testosterone level for you- however this needs to be done in the early am.  I will see if dr. Mamie Nick needs any other labs so we can draw them all at once

## 2019-03-11 ENCOUNTER — Encounter: Payer: Self-pay | Admitting: Family Medicine

## 2019-03-11 DIAGNOSIS — M79671 Pain in right foot: Secondary | ICD-10-CM

## 2019-03-11 DIAGNOSIS — N529 Male erectile dysfunction, unspecified: Secondary | ICD-10-CM

## 2019-03-11 DIAGNOSIS — M79672 Pain in left foot: Secondary | ICD-10-CM

## 2019-03-11 DIAGNOSIS — Z789 Other specified health status: Secondary | ICD-10-CM

## 2019-03-12 NOTE — Addendum Note (Signed)
Addended by: Lamar Blinks C on: 03/12/2019 08:58 AM   Modules accepted: Orders

## 2019-03-15 ENCOUNTER — Ambulatory Visit: Payer: Commercial Managed Care - PPO | Admitting: Gastroenterology

## 2019-03-19 ENCOUNTER — Other Ambulatory Visit: Payer: Self-pay

## 2019-03-19 ENCOUNTER — Other Ambulatory Visit (INDEPENDENT_AMBULATORY_CARE_PROVIDER_SITE_OTHER): Payer: Commercial Managed Care - PPO

## 2019-03-19 DIAGNOSIS — Z789 Other specified health status: Secondary | ICD-10-CM | POA: Diagnosis not present

## 2019-03-19 DIAGNOSIS — M79672 Pain in left foot: Secondary | ICD-10-CM | POA: Diagnosis not present

## 2019-03-19 DIAGNOSIS — M79671 Pain in right foot: Secondary | ICD-10-CM

## 2019-03-19 DIAGNOSIS — N529 Male erectile dysfunction, unspecified: Secondary | ICD-10-CM

## 2019-03-20 ENCOUNTER — Encounter: Payer: Self-pay | Admitting: Family Medicine

## 2019-03-20 DIAGNOSIS — M79671 Pain in right foot: Secondary | ICD-10-CM

## 2019-03-20 DIAGNOSIS — M79672 Pain in left foot: Secondary | ICD-10-CM

## 2019-03-20 LAB — TESTOSTERONE TOTAL,FREE,BIO, MALES
Albumin: 4.2 g/dL (ref 3.6–5.1)
Sex Hormone Binding: 25 nmol/L (ref 10–50)
Testosterone, Bioavailable: 139.5 ng/dL (ref >=110.0)
Testosterone, Free: 72.4 pg/mL (ref 46.0–224.0)
Testosterone: 429 ng/dL (ref 250–827)

## 2019-03-20 LAB — FERRITIN: Ferritin: 33 ng/mL (ref 22.0–322.0)

## 2019-03-22 MED ORDER — PREGABALIN 75 MG PO CAPS: 75.0000 mg | ORAL_CAPSULE | Freq: Two times a day (BID) | ORAL | 2 refills | Status: DC

## 2019-03-22 NOTE — Addendum Note (Signed)
Addended by: Lamar Blinks C on: 03/22/2019 05:35 AM   Modules accepted: Orders

## 2019-05-08 ENCOUNTER — Other Ambulatory Visit: Payer: Self-pay

## 2019-05-08 NOTE — Progress Notes (Signed)
[facility] [place], [place], [place] [address] [phone]  Date:  05/09/2019   Name:  Joe Ray   DOB:  15-Jan-1981   MRN:  [id number]  PCP:  Darreld Mclean, MD    Chief Complaint: Burning in feet (lyrica making him drowsy)   History of Present Illness:  Joe Ray is a 38 y.o. very pleasant male patient who presents with the following:  Joe Ray is here today to discuss a chronic issue with pain and burning in his feet He has seen neurology and had ABI testing, and also a brain MRI Unfortunately none of this was revealing I did have him try gabapentin earlier this year, this gave some temporary relief but was not really satisfactory.  We changed him to Lyrica in August- he feels like this helped a little bit It made him drowsy so he could not take it twice a day.  He is just taking 75 mg at bedtime.  It sometimes helps but sometimes not.   Some days this pain is just baseline, some days it is significantly worse It sounds like he sometimes has a powerful cramp in his foot which can cause severe pain for up to 20 minutes He has more tingling in his feet, and sometimes will have burning  He also feels as though his hands are not as strong as they should be.  In fact, his entire body is not as strong as he would like, but he attributes this to lack of exercise.  The grip strength decrease seems abnormal to him  He will sometimes have some back stiffness and soreness but nothing major We also did a testosterone and iron level for him in August, iron on the low side of normal but okay  Reviewed neurology note from September 2019 They did think that low B12 could be an issue Most recent B12 level in March of this year was normal He is taking B12 at home   MRI brain December 2019, normal ABI of his bilateral legs in September normal  Flu vaccine: done at his job  Tetanus: give booster today Routine labs done in  March of this year   He continues to notice some issues with ED - he is noticing fewer spontaneous erections, fewer nocturnal erections He is not currently sexually active Discussed giving him a trial of Viagra to see if it helps, he prefers to observe for now     Patient Active Problem List   Diagnosis Date Noted  . G E R D 06/30/2010  . COUGH 06/30/2010    Past Medical History:  Diagnosis Date  . Allergy     History reviewed. No pertinent surgical history.  Social History   Tobacco Use  . Smoking status: Never Smoker  . Smokeless tobacco: Never Used  Substance Use Topics  . Alcohol use: Never    Frequency: Never  . Drug use: Never    Family History  Problem Relation Age of Onset  . Diabetes Father     Allergies  Allergen Reactions  . Amoxicillin Diarrhea and Rash    Medication list has been reviewed and updated.  Current Outpatient Medications on File Prior to Visit  Medication Sig Dispense Refill  . albuterol (PROVENTIL HFA;VENTOLIN HFA) 108 (90 Base) MCG/ACT inhaler Inhale 2 puffs into the lungs every 6 (six) hours as needed for wheezing or shortness of breath. 1 Inhaler 0  .  Cholecalciferol (VITAMIN D-3) 1000 units CAPS Take by mouth.    . doxycycline (VIBRAMYCIN) 100 MG capsule Take 1 capsule (100 mg total) by mouth 2 (two) times daily. 20 capsule 0  . FLUoxetine (PROZAC) 10 MG tablet Take 1 tablet (10 mg total) by mouth daily. 30 tablet 6  . Methylcobalamin 1000 MCG TBDP Take by mouth.    . Multiple Vitamin (MULTIVITAMIN) tablet Take 1 tablet by mouth daily.    . pregabalin (LYRICA) 75 MG capsule Take 1 capsule (75 mg total) by mouth 2 (two) times daily. 60 capsule 2   No current facility-administered medications on file prior to visit.     Review of Systems:  As per HPI- otherwise negative. No fever or chills, no chest pain or shortness of breath  Physical Examination: Vitals:   05/09/19 1131  BP: 108/74  Pulse: 82  Resp: 16  Temp: 97.6 F  (36.4 C)  SpO2: 98%   Vitals:   05/09/19 1131  Weight: 162 lb (73.5 kg)  Height: 5\' 8"  (1.727 m)   Body mass index is 24.63 kg/m. Ideal Body Weight: Weight in (lb) to have BMI = 25: 164.1  GEN: WDWN, NAD, Non-toxic, A & O x 3, normal weight, looks well  HEENT: Atraumatic, Normocephalic. Neck supple. No masses, No LAD.  TM within normal limits bilaterally Ears and Nose: No external deformity. CV: RRR, No M/G/R. No JVD. No thrill. No extra heart sounds. PULM: CTA B, no wheezes, crackles, rhonchi. No retractions. No resp. distress. No accessory muscle use. ABD: S, NT, ND, +BS. No rebound. No HSM. EXTR: No c/c/e NEURO Normal gait.  PSYCH: Normally interactive. Conversant. Not depressed or anxious appearing.  Calm demeanor.  Patient seems to have normal strength in his extremities, except decreased grip strength bilaterally.  Feet are warm and well perfused, with normal pulses  Assessment and Plan: Pain in both feet  Immunization due - Plan: Td vaccine greater than or equal to 7yo preservative free IM  Erectile dysfunction, unspecified erectile dysfunction type  Numbness and tingling of foot  Upper extremity weakness  Here today with continuing pain, tingling, numbness of his bilateral feet.  This tends to be worse at night, but can also occur during the day.  He saw neurology last year and had a fairly exhaustive work-up.  However, he is now also noticing some weakness in his bilateral hands.  I will touch base with his neurologist about having him seen in the office again Failing this, I wonder if seen a chiropractor might possibly help.  Signed , MD

## 2019-05-09 ENCOUNTER — Other Ambulatory Visit: Payer: Self-pay

## 2019-05-09 ENCOUNTER — Encounter: Payer: Self-pay | Admitting: Family Medicine

## 2019-05-09 ENCOUNTER — Ambulatory Visit: Payer: Commercial Managed Care - PPO | Admitting: Family Medicine

## 2019-05-09 VITALS — BP 108/74 | HR 82 | Temp 97.6°F | Resp 16 | Ht 68.0 in | Wt 162.0 lb

## 2019-05-09 DIAGNOSIS — R200 Anesthesia of skin: Secondary | ICD-10-CM

## 2019-05-09 DIAGNOSIS — N529 Male erectile dysfunction, unspecified: Secondary | ICD-10-CM | POA: Diagnosis not present

## 2019-05-09 DIAGNOSIS — R29898 Other symptoms and signs involving the musculoskeletal system: Secondary | ICD-10-CM

## 2019-05-09 DIAGNOSIS — Z23 Encounter for immunization: Secondary | ICD-10-CM | POA: Diagnosis not present

## 2019-05-09 DIAGNOSIS — M79671 Pain in right foot: Secondary | ICD-10-CM

## 2019-05-09 DIAGNOSIS — M79672 Pain in left foot: Secondary | ICD-10-CM

## 2019-05-09 DIAGNOSIS — R202 Paresthesia of skin: Secondary | ICD-10-CM

## 2019-05-09 NOTE — Patient Instructions (Addendum)
Good to see you again today  You got your tetanus booster Please be sure to get your flu shot at work  I will contact Dr Leta Baptist and let him know that you are still struggling- likely they will want to see you again Let me know if any changes or worsening in the meantime

## 2019-05-10 ENCOUNTER — Telehealth: Payer: Self-pay | Admitting: *Deleted

## 2019-05-10 ENCOUNTER — Encounter: Payer: Self-pay | Admitting: *Deleted

## 2019-05-10 NOTE — Telephone Encounter (Signed)
LVM requesting call back to schedule a follow up. Advised if availability is too far out, have phone staff message me to see when I can work him in. Left office number.

## 2019-05-14 ENCOUNTER — Ambulatory Visit: Payer: Commercial Managed Care - PPO | Admitting: Diagnostic Neuroimaging

## 2019-05-14 ENCOUNTER — Encounter: Payer: Self-pay | Admitting: Diagnostic Neuroimaging

## 2019-05-14 ENCOUNTER — Other Ambulatory Visit: Payer: Self-pay

## 2019-05-14 VITALS — BP 106/74 | HR 89 | Temp 97.6°F | Ht 68.5 in | Wt 166.0 lb

## 2019-05-14 DIAGNOSIS — R202 Paresthesia of skin: Secondary | ICD-10-CM

## 2019-05-14 DIAGNOSIS — R200 Anesthesia of skin: Secondary | ICD-10-CM | POA: Diagnosis not present

## 2019-05-14 NOTE — Progress Notes (Signed)
[facility]  PATIENT: Joe Ray DOB: 08-Oct-1980  REFERRING CLINICIAN: J Copland HISTORY FROM: patient  REASON FOR VISIT: follow up   HISTORICAL  CHIEF COMPLAINT:  Chief Complaint  Patient presents with  . Numbness    rm 7, FU due to cont symptoms of numbness, tingling in feet, now in fingers    HISTORY OF PRESENT ILLNESS:   UPDATE (05/14/19, VRP): Since last visit, doing about the same. Symptoms are stable, and intermittent. Burning and stabbbing pain in feet, muscle cramps in feet. Some intermittent numbness and pain in hands. Sxs occur minutes at a time. B12 level improved. Worse pain with activity. Tolerating meds (lyrica 20m twice a day for pain control; mild relief).    PRIOR HPI (04/03/18):  38year old male here for evaluation of numbness and tingling.  3 weeks ago patient noticed numbness, swelling, redness in his feet and legs.  This progressed to tingling and burning sensation.  He has noticed some abnormal sensations in his fingers and hands.  Also some sensations in his face.  Sensations are intermittent and have plateaued.  No prodromal infections, vaccinations, injuries or traumas.  Patient has a vegetarian diet.   REVIEW OF SYSTEMS: Full 14 system review of systems performed and negative with exception of: pain numbness.  ALLERGIES: Allergies  Allergen Reactions  . Amoxicillin Diarrhea and Rash    HOME MEDICATIONS: Outpatient Medications Prior to Visit  Medication Sig Dispense Refill  . Cholecalciferol (VITAMIN D-3) 1000 units CAPS Take by mouth.    . Methylcobalamin 1000 MCG TBDP Take by mouth.    . Multiple Vitamin (MULTIVITAMIN) tablet Take 1 tablet by mouth daily.    . pregabalin (LYRICA) 75 MG capsule Take 1 capsule (75 mg total) by mouth 2 (two) times daily. 60 capsule 2   No facility-administered medications prior to visit.     PAST MEDICAL HISTORY: Past Medical History:  Diagnosis Date  . Allergy     PAST SURGICAL  HISTORY: No past surgical history on file.  FAMILY HISTORY: Family History  Problem Relation Age of Onset  . Diabetes Father     SOCIAL HISTORY: Social History   Socioeconomic History  . Marital status: Single    Spouse name: Not on file  . Number of children: Not on file  . Years of education: Not on file  . Highest education level: Not on file  Occupational History  . Not on file  Social Needs  . Financial resource strain: Not on file  . Food insecurity    Worry: Not on file    Inability: Not on file  . Transportation needs    Medical: Not on file    Non-medical: Not on file  Tobacco Use  . Smoking status: Never Smoker  . Smokeless tobacco: Never Used  Substance and Sexual Activity  . Alcohol use: Never    Frequency: Never  . Drug use: Never  . Sexual activity: Not on file  Lifestyle  . Physical activity    Days per week: Not on file    Minutes per session: Not on file  . Stress: Not on file  Relationships  . Social [place] phone: Not on file    Gets together: Not on file    Attends religious service: Not on file    Active member of club or organization: Not on file    Attends meetings of clubs or organizations: Not on file    Relationship status: Not  on file  . Intimate partner violence    Fear of current or ex partner: Not on file    Emotionally abused: Not on file    Physically abused: Not on file    Forced sexual activity: Not on file  Other Topics Concern  . Not on file  Social History Narrative   Lives at home with family.  Works at [employer].  No children.   !/2 cup tea daily.      PHYSICAL EXAM  GENERAL EXAM/CONSTITUTIONAL: Vitals:  Vitals:   05/14/19 1435  BP: 106/74  Pulse: 89  Temp: 97.6 F (36.4 C)  Weight: 166 lb (75.3 kg)  Height: 5' 8.5" (1.74 m)   Body mass index is 24.87 kg/m. Wt Readings from Last 3 Encounters:  05/14/19 166 lb (75.3 kg)  05/09/19 162 lb (73.5 kg)  03/05/19 162 lb (73.5 kg)     Patient is in no distress; well developed, nourished and groomed; neck is supple  CARDIOVASCULAR:  Examination of carotid arteries is normal; no carotid bruits  Regular rate and rhythm, no murmurs  Examination of peripheral vascular system by observation and palpation is normal  EYES:  Ophthalmoscopic exam of optic discs and posterior segments is normal; no papilledema or hemorrhages No exam data present  MUSCULOSKELETAL:  Gait, strength, tone, movements noted in Neurologic exam below  NEUROLOGIC: MENTAL STATUS:  No flowsheet data found.  awake, alert, oriented to person, place and time  recent and remote memory intact  normal attention and concentration  language fluent, comprehension intact, naming intact  fund of knowledge appropriate  CRANIAL NERVE:   2nd - no papilledema on fundoscopic exam  2nd, 3rd, 4th, 6th - pupils equal and reactive to light, visual fields full to confrontation, extraocular muscles intact, no nystagmus  5th - facial sensation symmetric  7th - facial strength symmetric  8th - hearing intact  9th - palate elevates symmetrically, uvula midline  11th - shoulder shrug symmetric  12th - tongue protrusion midline  MOTOR:   normal bulk and tone, full strength in the BUE, BLE  SENSORY:   normal and symmetric to light touch, pinprick, temperature, vibration; EXCEPT DECR PP IN BOTTOM OF FEET; INCREASED PP SENSITIVITY ON TOP OF FEET  COORDINATION:   finger-nose-finger, fine finger movements normal  REFLEXES:   deep tendon reflexes --> BUE TRACE, KNEES 1, ANKLES 0  GAIT/STATION:   narrow based gait     DIAGNOSTIC DATA (LABS, IMAGING, TESTING) - I reviewed patient records, labs, notes, testing and imaging myself where available.  Lab Results  Component Value Date   WBC 8.6 09/27/2018   HGB 15.0 09/27/2018   HCT 44.1 09/27/2018   MCV 83.1 09/27/2018   PLT 295.0 09/27/2018      Component Value Date/Time   NA 138  09/27/2018 1022   K 4.2 09/27/2018 1022   CL 102 09/27/2018 1022   CO2 27 09/27/2018 1022   GLUCOSE 75 09/27/2018 1022   BUN 14 09/27/2018 1022   CREATININE 0.74 09/27/2018 1022   CREATININE 0.81 12/15/2014 1133   CALCIUM 9.6 09/27/2018 1022   PROT 7.1 09/27/2018 1022   ALBUMIN 4.6 09/27/2018 1022   AST 20 09/27/2018 1022   ALT 28 09/27/2018 1022   ALKPHOS 89 09/27/2018 1022   BILITOT 0.4 09/27/2018 1022   Lab Results  Component Value Date   CHOL 198 09/27/2018   HDL 43.50 09/27/2018   LDLCALC 124 (H) 09/27/2018   TRIG 152.0 (H) 09/27/2018  CHOLHDL 5 09/27/2018   Lab Results  Component Value Date   HGBA1C 5.5 03/20/2018   Lab Results  Component Value Date   RDEYCXKG81 856 09/27/2018   Lab Results  Component Value Date   TSH 1.59 03/20/2018    11/29/11 CXR [I reviewed images myself and agree with interpretation. -VRP]  1.  No acute cardiopulmonary disease. 2.  Mild interstitial coarsening is likely chronic.  07/23/18 MRI brain  - normal  04/05/18 EMG/NCS - normal    ASSESSMENT AND PLAN  38 y.o. year old male here with new onset rapidly progressive numbness, tingling, burning sensation in hands and feet since 2019.   Dx:  1. Numbness and tingling     PLAN:  - check add'l neuropathy labs - continue B12 replacement - may consider EMG/NCS, MRI cervical and lumbar spine after lab results - continue lyrica; consider TCAs vs duloxetine for neuropathic pain  Orders Placed This Encounter  Procedures  . ANA,IFA RA Diag Pnl w/rflx Tit/Patn  . Pan-ANCA  . Vitamin B12  . Angiotensin converting enzyme  . Multiple Myeloma Panel (SPEP&IFE w/QIG)  . Vitamin B6  . Vitamin E  . Vitamin B1   Return pending test results, for pending if symptoms worsen or fail to improve.    Penni Bombard, MD 31/49/7026, 3:78 PM Certified in Neurology, Neurophysiology and Neuroimaging  [facility] [address], [place], [place] [address]  [phone]

## 2019-05-15 ENCOUNTER — Encounter: Payer: Self-pay | Admitting: Family Medicine

## 2019-05-20 LAB — ANGIOTENSIN CONVERTING ENZYME: Angio Convert Enzyme: 24 U/L (ref 14–82)

## 2019-05-20 LAB — MULTIPLE MYELOMA PANEL, SERUM
Albumin SerPl Elph-Mcnc: 3.7 g/dL (ref 2.9–4.4)
Albumin/Glob SerPl: 1.4 (ref 0.7–1.7)
Alpha 1: 0.2 g/dL (ref 0.0–0.4)
Alpha2 Glob SerPl Elph-Mcnc: 0.8 g/dL (ref 0.4–1.0)
B-Globulin SerPl Elph-Mcnc: 1.1 g/dL (ref 0.7–1.3)
Gamma Glob SerPl Elph-Mcnc: 0.6 g/dL (ref 0.4–1.8)
Globulin, Total: 2.7 g/dL (ref 2.2–3.9)
IgA/Immunoglobulin A, Serum: 229 mg/dL (ref 90–386)
IgG (Immunoglobin G), Serum: 711 mg/dL (ref 603–1613)
IgM (Immunoglobulin M), Srm: 59 mg/dL (ref 20–172)
Total Protein: 6.4 g/dL (ref 6.0–8.5)

## 2019-05-20 LAB — ANA,IFA RA DIAG PNL W/RFLX TIT/PATN
ANA Titer 1: NEGATIVE
Cyclic Citrullin Peptide Ab: 4 units (ref 0–19)
Rheumatoid fact SerPl-aCnc: <10 IU/mL (ref 0.0–13.9)

## 2019-05-20 LAB — VITAMIN B1: Thiamine: 155.8 nmol/L (ref 66.5–200.0)

## 2019-05-20 LAB — PAN-ANCA
ANCA Proteinase 3: <3.5 U/mL (ref 0.0–3.5)
Atypical pANCA: <1:20 {titer}
C-ANCA: <1:20 {titer}
Myeloperoxidase Ab: <9 U/mL (ref 0.0–9.0)
P-ANCA: <1:20 {titer}

## 2019-05-20 LAB — VITAMIN E
Vitamin E (Alpha Tocopherol): 10.7 mg/L (ref 5.9–19.4)
Vitamin E(Gamma Tocopherol): 0.8 mg/L (ref 0.7–4.9)

## 2019-05-20 LAB — VITAMIN B6: Vitamin B6: 21.9 ug/L (ref 5.3–46.7)

## 2019-05-20 LAB — VITAMIN B12: Vitamin B-12: 662 pg/mL (ref 232–1245)

## 2019-05-21 ENCOUNTER — Telehealth: Payer: Self-pay | Admitting: *Deleted

## 2019-05-21 NOTE — Telephone Encounter (Signed)
Spoke with patient and informed his labs are normal. He stated his symptoms are the same and asked for next steps. I reviewed Dr [facility] plan, and he stated he would think about NCS , MRI and call us back. Patient verbalized understanding, appreciation.

## 2019-06-07 ENCOUNTER — Ambulatory Visit: Payer: Commercial Managed Care - PPO | Admitting: Family Medicine

## 2019-06-07 ENCOUNTER — Encounter: Payer: Self-pay | Admitting: Family Medicine

## 2019-06-07 ENCOUNTER — Other Ambulatory Visit: Payer: Self-pay

## 2019-06-07 VITALS — BP 110/80 | HR 81 | Temp 96.7°F | Resp 16 | Ht 68.5 in | Wt 163.0 lb

## 2019-06-07 DIAGNOSIS — R200 Anesthesia of skin: Secondary | ICD-10-CM | POA: Diagnosis not present

## 2019-06-07 DIAGNOSIS — R202 Paresthesia of skin: Secondary | ICD-10-CM

## 2019-06-07 DIAGNOSIS — N529 Male erectile dysfunction, unspecified: Secondary | ICD-10-CM | POA: Diagnosis not present

## 2019-06-07 MED ORDER — SILDENAFIL CITRATE 20 MG PO TABS: ORAL_TABLET | ORAL | 3 refills | Status: DC

## 2019-06-07 MED ORDER — DULOXETINE HCL 30 MG PO CPEP: 30.0000 mg | ORAL_CAPSULE | Freq: Two times a day (BID) | ORAL | 5 refills | Status: DC

## 2019-06-07 NOTE — Progress Notes (Signed)
[place] [facility] [address] [place], [place] [address] [phone]  Date:  06/07/2019   Name:  Joe Ray   DOB:  1981-06-07   MRN:  [id number]  PCP:  Darreld Mclean, MD    Chief Complaint: Foot Pain (bilateral)   History of Present Illness:  Joe Ray is a 38 y.o. very pleasant male patient who presents with the following:  Patient who is generally healthy except for chronic pain and burning in bilateral feet-which has been present for well over a year-and now also some similar symptoms in his hands he has had a thorough neurology work-up.  I saw him most recently about 1 month ago-that time he was taking Lyrica at bedtime-not able to take twice daily due to drowsiness He also noticed some weakness in his hands, so I asked him to follow-up with neurology  He saw Dr. Leta Baptist on October 19-they did additional lab eval for neuropathy-all okay They are considering repeat nerve conduction studies, MRI cervical and lumbar spine as next step Dr. Gladstone Lighter note also mentions adding a tricyclic or duloxetine for neuropathic pain to current Lyrica  He stopped taking lyrica- it was making him too tired in the mornings  Flu shot is up-to-date  Roby also notices few spontaneous erections and poor erectile quality. We did ABI testing for him about a year ago, arterial function to lower extremities looked okay  He is working from home and also going into the office-about 50-50  No fever or chills, no chest pain or shortness of breath Patient Active Problem List   Diagnosis Date Noted  . G E R D 06/30/2010  . COUGH 06/30/2010    Past Medical History:  Diagnosis Date  . Allergy     History reviewed. No pertinent surgical history.  Social History   Tobacco Use  . Smoking status: Never Smoker  . Smokeless tobacco: Never Used  Substance Use Topics  . Alcohol use: Never    Frequency: Never  . Drug use: Never     Family History  Problem Relation Age of Onset  . Diabetes Father     Allergies  Allergen Reactions  . Amoxicillin Diarrhea and Rash    Medication list has been reviewed and updated.  Current Outpatient Medications on File Prior to Visit  Medication Sig Dispense Refill  . Cholecalciferol (VITAMIN D-3) 1000 units CAPS Take by mouth.    . Methylcobalamin 1000 MCG TBDP Take by mouth.    . Multiple Vitamin (MULTIVITAMIN) tablet Take 1 tablet by mouth daily.    . pregabalin (LYRICA) 75 MG capsule Take 1 capsule (75 mg total) by mouth 2 (two) times daily. (Patient not taking: Reported on 06/07/2019) 60 capsule 2   No current facility-administered medications on file prior to visit.     Review of Systems:  As per HPI- otherwise negative.   Physical Examination: Vitals:   06/07/19 1132  BP: 110/80  Pulse: 81  Resp: 16  Temp: (!) 96.7 F (35.9 C)  SpO2: 100%   Vitals:   06/07/19 1132  Weight: 163 lb (73.9 kg)  Height: 5' 8.5" (1.74 m)   Body mass index is 24.42 kg/m. Ideal Body Weight: Weight in (lb) to have BMI = 25: 166.5  GEN: WDWN, NAD, Non-toxic, A & O x 3, normal weight, looks well  HEENT: Atraumatic, Normocephalic. Neck supple. No masses, No LAD. Ears and Nose: No external deformity. CV:  RRR, No M/G/R. No JVD. No thrill. No extra heart sounds. PULM: CTA B, no wheezes, crackles, rhonchi. No retractions. No resp. distress. No accessory muscle use. ABD: S, NT, ND, +BS. No rebound. No HSM. EXTR: No c/c/e NEURO Normal gait.  PSYCH: Normally interactive. Conversant. Not depressed or anxious appearing.  Calm demeanor.  Feet are warm and well perfused.  Normal pulses Left hand strength is slightly decreased c/w right  Otherwise strength, DTR of all extremities is normal  Assessment and Plan: Erectile dysfunction, unspecified erectile dysfunction type - Plan: sildenafil (REVATIO) 20 MG tablet  Numbness and tingling of foot - Plan: Ambulatory referral to  Vascular Surgery, DULoxetine (CYMBALTA) 30 MG capsule  Here today with persistent numbness, tingling, burning pain in his bilateral feet.  He has had ABI testing, and undergone a thorough neurology evaluation.  Neurology has suggested repeat nerve conduction testing and/or MRI of his spine-patient is considering these options.  He also wonders if this could be a vascular issue.  He is more suspicious of this due to erectile problems.  We did do ABI testing, but no other vascular testing so far.  We can certainly refer him to vascular surgery for an opinion.  I also gave him prescription for sildenafil to try, we are interested to see if this will help with his erectile function and if it could also possibly relieve his foot pain  We will start him on Cymbalta for presumed persistent neuropathic pain  Signed Abbe Amsterdam, MD

## 2019-06-07 NOTE — Patient Instructions (Signed)
Good to see you again today!  I will set you up to see vascular surgery for an opinion about your concerns Start on cymbalta 30 mg twice a day- we can go up if helpful for you  I also gave you some generic sildenafil to try for ED- take 20 - 100 mg (probably start with 20 -60 mg) as needed for ED

## 2019-07-30 ENCOUNTER — Other Ambulatory Visit: Payer: Self-pay | Admitting: *Deleted

## 2019-07-30 ENCOUNTER — Ambulatory Visit (INDEPENDENT_AMBULATORY_CARE_PROVIDER_SITE_OTHER): Payer: Commercial Managed Care - PPO | Admitting: Surgery

## 2019-07-30 ENCOUNTER — Other Ambulatory Visit: Payer: Self-pay

## 2019-07-30 ENCOUNTER — Encounter: Payer: Self-pay | Admitting: Surgery

## 2019-07-30 ENCOUNTER — Ambulatory Visit (HOSPITAL_COMMUNITY)
Admission: RE | Admit: 2019-07-30 | Discharge: 2019-07-30 | Disposition: A | Discharge status: Home / Self Care | Payer: Commercial Managed Care - PPO | Source: Ambulatory Visit | Attending: Surgery | Admitting: Surgery

## 2019-07-30 VITALS — BP 98/67 | HR 76 | Temp 97.7°F | Resp 20 | Ht 68.5 in | Wt 165.0 lb

## 2019-07-30 DIAGNOSIS — R200 Anesthesia of skin: Secondary | ICD-10-CM | POA: Insufficient documentation

## 2019-07-30 DIAGNOSIS — M25569 Pain in unspecified knee: Secondary | ICD-10-CM

## 2019-07-30 DIAGNOSIS — G609 Hereditary and idiopathic neuropathy, unspecified: Secondary | ICD-10-CM

## 2019-07-30 DIAGNOSIS — R202 Paresthesia of skin: Secondary | ICD-10-CM

## 2019-07-30 NOTE — Progress Notes (Signed)
[facility] [place]  Patient name: Joe Ray MRN: [id number] DOB: Jul 20, 1981 Sex: male   REQUESTING PROVIDER:    Dr. [place] Schimke   REASON FOR CONSULT:    Numbness in feet and left hand  HISTORY OF PRESENT ILLNESS:   Joe Ray is a 39 y.o. male, who is referred for evaluation of pain and numbness that he has been having in his feet and arms for about a year.  He has tried Neurontin but had issues with fatigue and so this has been discontinued.  He has also tried Cymbalta which she is currently taking with mild benefit.  The symptoms appear to be worse during colder months.  He can tell when he has an episode coming on because his skin turned red.  The feet bother him more than his hands, and his left hand is worse than the right.  He does not have any open wounds.  He has undergone lab work which was unremarkable.  He denies any autoimmune disorders in his family.  He denies having diabetes.  He does have a father with diabetes.  He is a non-smoker.  PAST MEDICAL HISTORY    Past Medical History:  Diagnosis Date  . Allergy      FAMILY HISTORY   Family History  Problem Relation Age of Onset  . Diabetes Father     SOCIAL HISTORY:   Social History   Socioeconomic History  . Marital status: Single    Spouse name: Not on file  . Number of children: Not on file  . Years of education: Not on file  . Highest education level: Not on file  Occupational History  . Not on file  Tobacco Use  . Smoking status: Never Smoker  . Smokeless tobacco: Never Used  Substance and Sexual Activity  . Alcohol use: Never  . Drug use: Never  . Sexual activity: Not on file  Other Topics Concern  . Not on file  Social History Narrative   Lives at home with family.  Works at [employer].  No children.   !/2 cup tea daily.    Social Determinants of Health   Financial Resource Strain:   . Difficulty of Paying Living Expenses: Not  on file  Food Insecurity:   . Worried About Programme researcher, broadcasting/film/video in the Last Year: Not on file  . Ran Out of Food in the Last Year: Not on file  Transportation Needs:   . Lack of Transportation (Medical): Not on file  . Lack of Transportation (Non-Medical): Not on file  Physical Activity:   . Days of Exercise per Week: Not on file  . Minutes of Exercise per Session: Not on file  Stress:   . Feeling of Stress : Not on file  Social Connections:   . Frequency of Communication with Friends and Family: Not on file  . Frequency of Social Gatherings with Friends and Family: Not on file  . Attends Religious Services: Not on file  . Active Member of Clubs or Organizations: Not on file  . Attends Banker Meetings: Not on file  . Marital Status: Not on file  Intimate Partner Violence:   . Fear of Current or Ex-Partner: Not on file  . Emotionally Abused: Not on file  . Physically Abused: Not on file  . Sexually Abused: Not on file    ALLERGIES:    Allergies  Allergen Reactions  . Amoxicillin Diarrhea and Rash  CURRENT MEDICATIONS:    Current Outpatient Medications  Medication Sig Dispense Refill  . Cholecalciferol (VITAMIN D-3) 1000 units CAPS Take by mouth.    . DULoxetine (CYMBALTA) 30 MG capsule Take 1 capsule (30 mg total) by mouth 2 (two) times daily. 60 capsule 5  . Methylcobalamin 1000 MCG TBDP Take by mouth.    . Multiple Vitamin (MULTIVITAMIN) tablet Take 1 tablet by mouth daily.    . sildenafil (REVATIO) 20 MG tablet Take 20- 100 mg by mouth daily as needed for ED.  Max 10 mg per day 20 tablet 3   No current facility-administered medications for this visit.    REVIEW OF SYSTEMS:   [X]  denotes positive finding, [ ]  denotes negative finding Cardiac  Comments:  Chest pain or chest pressure:    Shortness of breath upon exertion:    Short of breath when lying flat:    Irregular heart rhythm:        Vascular    Pain in calf, thigh, or hip brought on by  ambulation:    Pain in feet at night that wakes you up from your sleep:  xx   Blood clot in your veins:    Leg swelling:  xx       Pulmonary    Oxygen at home:    Productive cough:     Wheezing:         Neurologic    Sudden weakness in arms or legs:     Sudden numbness in arms or legs:     Sudden onset of difficulty speaking or slurred speech:    Temporary loss of vision in one eye:     Problems with dizziness:         Gastrointestinal    Blood in stool:      Vomited blood:         Genitourinary    Burning when urinating:     Blood in urine:        Psychiatric    Major depression:         Hematologic    Bleeding problems:    Problems with blood clotting too easily:        Skin    Rashes or ulcers:        Constitutional    Fever or chills:     PHYSICAL EXAM:   Vitals:   07/30/19 1150  BP: 98/67  Pulse: 76  Resp: 20  Temp: 97.7 F (36.5 C)  SpO2: 100%  Weight: 165 lb (74.8 kg)  Height: 5' 8.5" (1.74 m)    GENERAL: The patient is a well-nourished male, in no acute distress. The vital signs are documented above. CARDIAC: There is a regular rate and rhythm.  VASCULAR: Palpable posterior tibial pulses bilaterally PULMONARY: Nonlabored respirations  MUSCULOSKELETAL: There are no major deformities or cyanosis. NEUROLOGIC: No focal weakness or paresthesias are detected. SKIN: There are no ulcers or rashes noted. PSYCHIATRIC: The patient has a normal affect.  STUDIES:   I have reviewed the following: ABI/TBIToday's ABIToday's TBIPrevious ABIPrevious TBI +-------+-----------+-----------+------------+------------+ Right  1.24       1.04                                +-------+-----------+-----------+------------+------------+ Left   1.27       0.83                                +-------+-----------+-----------+------------+------------+  Right toe pressure:  112 Left toe pressure:  90  ASSESSMENT and PLAN   Peripheral neuropathy:  Patient has a classic stocking and glove distribution of peripheral neuropathy.  The underlying cause is not known at this time.  He has undergone a work-up that has been unremarkable to date.  From a vascular perspective, I do not feel that his symptoms are related to arterial insufficiency.  He is being considered for cervical and lumbar MRI to help evaluate for a source.  He may also benefit from rheumatology evaluation.   Leia Alf, MD, FACS [facility] [phone] Pager [phone]

## 2019-07-31 ENCOUNTER — Encounter: Payer: Self-pay | Admitting: Family Medicine

## 2019-08-07 ENCOUNTER — Telehealth: Payer: Self-pay | Admitting: Diagnostic Neuroimaging

## 2019-08-07 DIAGNOSIS — R202 Paresthesia of skin: Secondary | ICD-10-CM

## 2019-08-07 DIAGNOSIS — R200 Anesthesia of skin: Secondary | ICD-10-CM

## 2019-08-07 NOTE — Telephone Encounter (Signed)
Pt called wanting to know if he is going to get referred for an MRI like it was mentioned at his last appt. Pt states if so he would like to go ahead and schedule that appt. Please advise.

## 2019-08-08 NOTE — Telephone Encounter (Signed)
Orders Placed This Encounter  Procedures  . MR CERVICAL SPINE W WO CONTRAST  . MR LUMBAR SPINE WO CONTRAST    Suanne Marker, MD 08/08/2019, 11:27 AM Certified in Neurology, Neurophysiology and Neuroimaging  [facility] [address], [address] [place], [place] [address] [phone]

## 2019-08-08 NOTE — Telephone Encounter (Signed)
Called patient and informed him that Dr Marjory Lies has ordered MRI cervical and MRI lumbar spines. I advised insurance must approve these scans. He will then get a call to schedule them. Advised it may take 1-2 weeks before he gets a call back. Patient verbalized understanding, appreciation.

## 2019-08-17 ENCOUNTER — Telehealth: Payer: Self-pay | Admitting: Diagnostic Neuroimaging

## 2019-08-17 NOTE — Telephone Encounter (Signed)
Pt has called stating he was returning the call to [place] from 01-21, he is asking for a call back re: the scheduling of his MRI please call

## 2019-08-21 NOTE — Telephone Encounter (Signed)
Late Entry;   I spoke with the patient and scheduled his MRI. DWD

## 2019-08-22 ENCOUNTER — Other Ambulatory Visit: Payer: Self-pay

## 2019-08-22 ENCOUNTER — Ambulatory Visit: Payer: Commercial Managed Care - PPO

## 2019-08-22 DIAGNOSIS — R202 Paresthesia of skin: Secondary | ICD-10-CM

## 2019-08-22 DIAGNOSIS — R200 Anesthesia of skin: Secondary | ICD-10-CM | POA: Diagnosis not present

## 2019-08-22 MED ORDER — GADOBENATE DIMEGLUMINE 529 MG/ML IV SOLN
15.0000 mL | Freq: Once | INTRAVENOUS | Status: AC | PRN
  Administered 2019-08-22: 16:00:00 15 mL via INTRAVENOUS

## 2019-08-30 ENCOUNTER — Telehealth: Payer: Self-pay | Admitting: *Deleted

## 2019-08-30 NOTE — Telephone Encounter (Signed)
Spoke with patient and informed him his MRI cervical spine had unremarkable results; MRI lumbar spine is normal. He stated he had seen results on my chart,  verbalized understanding, appreciation.

## 2019-09-06 ENCOUNTER — Telehealth: Payer: Self-pay | Admitting: *Deleted

## 2019-09-06 NOTE — Telephone Encounter (Signed)
I mailed pt cd to pt home address.

## 2019-09-16 NOTE — Progress Notes (Addendum)
[place] at [facility] [address], [place], [place] [address] [phone]  Date:  09/17/2019   Name:  Joe Ray   DOB:  1981/03/26   MRN:  [id number]  PCP:  Darreld Mclean, MD    Chief Complaint: Feet Pain   History of Present Illness:  Joe Ray is a 39 y.o. very pleasant male patient who presents with the following:  Generally healthy gentleman who is here again today to discuss chronic pain and numbness in both feet.  I last saw him for this issue in October He has seen vascular surgery and neurology so far for this issue  Vascular surgery does not feel this is a vascular or arterial insufficiency issue.  Consistent with peripheral neuropathy.  At one point it seemed symptoms may be due to B12 deficiency, but correction of B12 level did not help  He had a negative cervical and lumbar MRI at the end of January He has also had a brain MRI in December 2019, ABI of bilateral lower extremities in September 2020-both studies normal  We have tried gabapentin, and then changed to Lyrica which was sometimes helpful- we then changed him to cymbalta in November This helps somewhat was symptoms He is taking 30 mg often twice a day but sometimes he forgets to take his 2nd dose and just takes 30 milligrams total per day This gives him perhaps 50% relief- will wax and wane depending on the day  He will have "muscle spasms" in his feet some of the time His feet are worse if he is on his feet more during the day His feet sometimes swell especially at night His hands bother him off and on only  He tries to avoid the cold, as cold exposure does seem to make him worse He tries to wear thick socks to keep his feet warm  Due to his father's illness and other various factors he is not sexually active with a partner right now.  He has tried sildenafil a few times-this does tend to increase likelihood of a spontaneous erection during the  night.  He notes that when he does get an erection he will tend to orgasm very quickly, this worries him and is embarrassing.  Occasionally has erections well go away without orgasm Patient Active Problem List   Diagnosis Date Noted  . G E R D 06/30/2010  . COUGH 06/30/2010    Past Medical History:  Diagnosis Date  . Allergy     History reviewed. No pertinent surgical history.  Social History   Tobacco Use  . Smoking status: Never Smoker  . Smokeless tobacco: Never Used  Substance Use Topics  . Alcohol use: Never  . Drug use: Never    Family History  Problem Relation Age of Onset  . Diabetes Father     Allergies  Allergen Reactions  . Amoxicillin Diarrhea and Rash    Medication list has been reviewed and updated.  Current Outpatient Medications on File Prior to Visit  Medication Sig Dispense Refill  . Cholecalciferol (VITAMIN D-3) 1000 units CAPS Take by mouth.    . DULoxetine (CYMBALTA) 30 MG capsule Take 1 capsule (30 mg total) by mouth 2 (two) times daily. 60 capsule 5  . Methylcobalamin 1000 MCG TBDP Take by mouth.    . Multiple Vitamin (MULTIVITAMIN) tablet Take 1 tablet by mouth daily.    . sildenafil (REVATIO) 20 MG tablet Take 20-  100 mg by mouth daily as needed for ED.  Max 10 mg per day 20 tablet 3   No current facility-administered medications on file prior to visit.    Review of Systems:  As per HPI- otherwise negative.   Physical Examination: Vitals:   09/17/19 1611  BP: 122/80  Pulse: 72  Resp: 16  Temp: (!) 97 F (36.1 C)  SpO2: 97%   Vitals:   09/17/19 1611  Weight: 162 lb (73.5 kg)  Height: 5' 8.5" (1.74 m)   Body mass index is 24.27 kg/m. Ideal Body Weight: Weight in (lb) to have BMI = 25: 166.5  GEN: no acute distress.  Normal weight, looks well HEENT: Atraumatic, Normocephalic.  Ears and Nose: No external deformity. CV: RRR, No M/G/R. No JVD. No thrill. No extra heart sounds. PULM: CTA B, no wheezes, crackles, rhonchi. No  retractions. No resp. distress. No accessory muscle use. ABD: S, NT, ND, +BS. No rebound. No HSM. EXTR: No c/c/e PSYCH: Normally interactive. Conversant.  Feet are normal to exam, warm and well-perfused.  No swelling   Assessment and Plan: Numbness and tingling of foot - Plan: Sedimentation rate, C-reactive protein, Rheumatoid Factor, Cyclic citrul peptide antibody, IgG (QUEST), ANA, DULoxetine (CYMBALTA) 60 MG capsule  Screening for diabetes mellitus - Plan: Hemoglobin A1c, Comprehensive metabolic panel  Screening for deficiency anemia - Plan: CBC  Screening for thyroid disorder - Plan: TSH  Erectile dysfunction, unspecified erectile dysfunction type  Here today with a persistent problem of bilateral foot numbness, tingling, pain, swelling.  He has been to see vascular surgery and also neurology, no definite cause has been found.  He is currently taking Cymbalta with some success.  We will try increasing his total Cymbalta dose to 90 mg/day, and then 120 if 90 is helpful and well-tolerated  Patient wonders if this could be an autoimmune problem, this idea was brought up by vascular surgery.  We can certainly check labs to look for any evidence of an autoimmune disorder.  If present, will refer to rheumatology.  If negative, would suggest getting a second opinion from neurology for his peace of mind.  If no secondary cause is found, we can then concentrate on symptom control  We discussed his erectile issues.  He has tried taking 20 to 40 mg of sildenafil with some success.  I advised him that he could experiment with 60 to 80 mg and see if this works better for him.  It is also possible this is linked to another neurologic disorder.  He will keep me posted about this issue  Moderate medical decision making today Moderate medical decision making  This visit occurred during the SARS-CoV-2 public health emergency.  Safety protocols were in place, including screening questions prior to the  visit, additional usage of staff PPE, and extensive cleaning of exam room while observing appropriate contact time as indicated for disinfecting solutions.    Signed Abbe Amsterdam, MD   Addendum 2/23, received his labs-message to patient  Results for orders placed or performed in visit on 09/17/19  Sedimentation rate  Result Value Ref Range   Sed Rate 16 (H) 0 - 15 mm/hr  C-reactive protein  Result Value Ref Range   CRP <1.0 0.5 - 20.0 mg/dL  Rheumatoid Factor  Result Value Ref Range   Rhuematoid fact SerPl-aCnc <14 <14 IU/mL  Cyclic citrul peptide antibody, IgG (QUEST)  Result Value Ref Range   Cyclic Citrullin Peptide Ab <16 UNITS  CBC  Result Value  Ref Range   WBC 6.8 4.0 - 10.5 K/uL   RBC 4.90 4.22 - 5.81 Mil/uL   Platelets 230.0 150.0 - 400.0 K/uL   Hemoglobin 13.7 13.0 - 17.0 g/dL   HCT 52.0 80.2 - 23.3 %   MCV 84.4 78.0 - 100.0 fl   MCHC 33.1 30.0 - 36.0 g/dL   RDW 61.2 24.4 - 97.5 %  Hemoglobin A1c  Result Value Ref Range   Hgb A1c MFr Bld 5.8 4.6 - 6.5 %  Comprehensive metabolic panel  Result Value Ref Range   Sodium 139 135 - 145 mEq/L   Potassium 4.3 3.5 - 5.1 mEq/L   Chloride 102 96 - 112 mEq/L   CO2 29 19 - 32 mEq/L   Glucose, Bld 120 (H) 70 - 99 mg/dL   BUN 18 6 - 23 mg/dL   Creatinine, Ser 3.00 0.40 - 1.50 mg/dL   Total Bilirubin 0.3 0.2 - 1.2 mg/dL   Alkaline Phosphatase 86 39 - 117 U/L   AST 22 0 - 37 U/L   ALT 33 0 - 53 U/L   Total Protein 6.9 6.0 - 8.3 g/dL   Albumin 4.5 3.5 - 5.2 g/dL   GFR 511.02 >11.17 mL/min   Calcium 9.4 8.4 - 10.5 mg/dL  TSH  Result Value Ref Range   TSH 1.62 0.35 - 4.50 uIU/mL

## 2019-09-17 ENCOUNTER — Other Ambulatory Visit: Payer: Self-pay

## 2019-09-17 ENCOUNTER — Ambulatory Visit: Payer: Commercial Managed Care - PPO | Admitting: Family Medicine

## 2019-09-17 ENCOUNTER — Encounter: Payer: Self-pay | Admitting: Family Medicine

## 2019-09-17 VITALS — BP 122/80 | HR 72 | Temp 97.0°F | Resp 16 | Ht 68.5 in | Wt 162.0 lb

## 2019-09-17 DIAGNOSIS — N529 Male erectile dysfunction, unspecified: Secondary | ICD-10-CM

## 2019-09-17 DIAGNOSIS — Z1329 Encounter for screening for other suspected endocrine disorder: Secondary | ICD-10-CM

## 2019-09-17 DIAGNOSIS — R202 Paresthesia of skin: Secondary | ICD-10-CM | POA: Diagnosis not present

## 2019-09-17 DIAGNOSIS — R200 Anesthesia of skin: Secondary | ICD-10-CM

## 2019-09-17 DIAGNOSIS — Z130 Encounter for screening for diseases of the blood and blood-forming organs and certain disorders involving the immune mechanism: Secondary | ICD-10-CM

## 2019-09-17 DIAGNOSIS — Z131 Encounter for screening for diabetes mellitus: Secondary | ICD-10-CM | POA: Diagnosis not present

## 2019-09-17 MED ORDER — DULOXETINE HCL 60 MG PO CPEP: 60.0000 mg | ORAL_CAPSULE | Freq: Two times a day (BID) | ORAL | 3 refills | Status: DC

## 2019-09-17 NOTE — Patient Instructions (Addendum)
It was good to see you again today I will be in touch with your labs asap   If you appear to have any auto-immune disorder I will refer you to rheumatology.  If not, we might try another neurology opinion to see if there is anything we have not thought of yet   Let's try increasing your cymbalta- first go to 90 mg total daily- take 30 am and 60 pm perhaps If this seems to help we can go to 60 mg twice a day  Let's try sildenafil 60- 80 mg as an experiment- let me know if this helps you

## 2019-09-18 ENCOUNTER — Encounter: Payer: Self-pay | Admitting: Family Medicine

## 2019-09-18 DIAGNOSIS — R202 Paresthesia of skin: Secondary | ICD-10-CM

## 2019-09-18 DIAGNOSIS — R200 Anesthesia of skin: Secondary | ICD-10-CM

## 2019-09-18 LAB — COMPREHENSIVE METABOLIC PANEL
ALT: 33 U/L (ref 0–53)
AST: 22 U/L (ref 0–37)
Albumin: 4.5 g/dL (ref 3.5–5.2)
Alkaline Phosphatase: 86 U/L (ref 39–117)
BUN: 18 mg/dL (ref 6–23)
CO2: 29 mEq/L (ref 19–32)
Calcium: 9.4 mg/dL (ref 8.4–10.5)
Chloride: 102 mEq/L (ref 96–112)
Creatinine, Ser: 0.79 mg/dL (ref 0.40–1.50)
GFR: 109.48 mL/min (ref >60.00)
Glucose, Bld: 120 mg/dL — ABNORMAL HIGH (ref 70–99)
Potassium: 4.3 mEq/L (ref 3.5–5.1)
Sodium: 139 mEq/L (ref 135–145)
Total Bilirubin: 0.3 mg/dL (ref 0.2–1.2)
Total Protein: 6.9 g/dL (ref 6.0–8.3)

## 2019-09-18 LAB — SEDIMENTATION RATE: Sed Rate: 16 mm/hr — ABNORMAL HIGH (ref 0–15)

## 2019-09-18 LAB — CBC
HCT: 41.4 % (ref 39.0–52.0)
Hemoglobin: 13.7 g/dL (ref 13.0–17.0)
MCHC: 33.1 g/dL (ref 30.0–36.0)
MCV: 84.4 fl (ref 78.0–100.0)
Platelets: 230 10*3/uL (ref 150.0–400.0)
RBC: 4.9 Mil/uL (ref 4.22–5.81)
RDW: 13.4 % (ref 11.5–15.5)
WBC: 6.8 10*3/uL (ref 4.0–10.5)

## 2019-09-18 LAB — HEMOGLOBIN A1C: Hgb A1c MFr Bld: 5.8 % (ref 4.6–6.5)

## 2019-09-18 LAB — RHEUMATOID FACTOR: Rheumatoid fact SerPl-aCnc: <14 IU/mL (ref <14)

## 2019-09-18 LAB — ANA: Anti Nuclear Antibody (ANA): NEGATIVE

## 2019-09-18 LAB — TSH: TSH: 1.62 u[IU]/mL (ref 0.35–4.50)

## 2019-09-18 LAB — CYCLIC CITRUL PEPTIDE ANTIBODY, IGG: Cyclic Citrullin Peptide Ab: <16 UNITS

## 2019-09-18 LAB — C-REACTIVE PROTEIN: CRP: <1 mg/dL (ref 0.5–20.0)

## 2019-10-15 ENCOUNTER — Encounter: Payer: Self-pay | Admitting: Family Medicine

## 2019-11-12 NOTE — Telephone Encounter (Signed)
Caller: Joe Ray Call Back # [phone]   Patient returning a call in regards to appointment on Wednesday. Patient states no other symptoms.

## 2019-11-13 ENCOUNTER — Other Ambulatory Visit: Payer: Self-pay

## 2019-11-13 DIAGNOSIS — R7303 Prediabetes: Secondary | ICD-10-CM | POA: Insufficient documentation

## 2019-11-13 DIAGNOSIS — G629 Polyneuropathy, unspecified: Secondary | ICD-10-CM | POA: Insufficient documentation

## 2019-11-13 NOTE — Progress Notes (Addendum)
[place] at [facility] [address] [place], [place] [address] [phone]  Date:  11/14/2019   Name:  Joe Ray   DOB:  12/01/1980   MRN:  [id number]  PCP:  Darreld Mclean, MD    Chief Complaint: Pulse Fluctuating (shortness of breath,lightheadedness, 5 days)   History of Present Illness:  Joe Ray is a 39 y.o. very pleasant male patient who presents with the following:  Young, otherwise generally healthy young man who has history of pain and numbness in his feet.  He has seen vascular surgery and neurology.  This is thought to be a peripheral neuropathy though etiology is unknown We tried repeating deficient B12, this was unfortunately not helpful He had a cervical and lumbar MRI at the end of January, brain MRI in December 2019, ABI bilateral lower extremities in September 2020-all noncontributory  We have tried gabapentin and Lyrica, he is now taking Cymbalta  At his last visit I suggested increasing Cymbalta from 60 mg to 90, and then possibly to 120 We also did some autoimmune lab evaluation, and discussed concerns about erectile dysfunction  His autoimmune tests were basically noncontributory, sed rate 16 but everything else normal We also plan to get a second opinion per neurology at [place] looks like this referral is in process; this is scheduled for 11/29/19 He has noted worsening of his foot sx for 2-3 weeks   Today he has concern of highly variable pulse for the last 4-5 days.  He was feeling SOB and lightheaded so he checked his pulse and BP at home; he has a home blood pressure cuff which also measures pulse, he does not use a "fitness watch" His BP has been 120-125/58- 80 or so His pulse has seemed to vary between 75 or so at rest, but with activity might go up to 90- 105 max  No chest pain, perhaps mild pressure but he is not really sure  Never had this in the past  He is not aware of  any family history of heart disease  He does not smoke He does not do a lot of exercise due to his foot issues He notes some worsening of his new sx as above with activity but not consistently   He has not had covid 19, has not yet been able to get the vaccine yet but he does plan to do so   He has seen blood in his stool again- this occurred on a couple of occasions in the past and then resolved He also might get mouth sores off and on - this seems to trigger hard stools which then can lead to rectal bleeding He will eat some softer foods and this will resolve He has noted blood in his stool perhaps 3 times over the last few years This occurred again this am-was on the tissue and a small amount in the toilet bowl He does tend to notice some pain with BM when he has the blood- may be a fissure   He may get a mouth sore every few months; he is not sure what causes these.  Sometimes rectal bleeding will follow   Patient Active Problem List   Diagnosis Date Noted  . Prediabetes 11/13/2019  . Peripheral neuropathy 11/13/2019  . G E R D 06/30/2010  . COUGH 06/30/2010    Past Medical History:  Diagnosis Date  . Allergy     History  reviewed. No pertinent surgical history.  Social History   Tobacco Use  . Smoking status: Never Smoker  . Smokeless tobacco: Never Used  Substance Use Topics  . Alcohol use: Never  . Drug use: Never    Family History  Problem Relation Age of Onset  . Diabetes Father     Allergies  Allergen Reactions  . Amoxicillin Diarrhea and Rash    Medication list has been reviewed and updated.  Current Outpatient Medications on File Prior to Visit  Medication Sig Dispense Refill  . Cholecalciferol (VITAMIN D-3) 1000 units CAPS Take by mouth.    . DULoxetine (CYMBALTA) 60 MG capsule Take 1 capsule (60 mg total) by mouth 2 (two) times daily. 60 capsule 3  . Methylcobalamin 1000 MCG TBDP Take by mouth.    . Multiple Vitamin (MULTIVITAMIN) tablet Take 1  tablet by mouth daily.     No current facility-administered medications on file prior to visit.    Review of Systems:  As per HPI- otherwise negative.   Physical Examination: Vitals:   11/14/19 1055  BP: 105/73  Pulse: 82  Resp: 17  Temp: (!) 97.1 F (36.2 C)  SpO2: 95%   Vitals:   11/14/19 1055  Weight: 165 lb (74.8 kg)  Height: 5' 8.5" (1.74 m)   Body mass index is 24.72 kg/m. Ideal Body Weight: Weight in (lb) to have BMI = 25: 166.5  GEN: no acute distress.  Normal weight, looks well  HEENT: Atraumatic, Normocephalic.  Ears and Nose: No external deformity. CV: RRR, No M/G/R. No JVD. No thrill. No extra heart sounds. PULM: CTA B, no wheezes, crackles, rhonchi. No retractions. No resp. distress. No accessory muscle use. ABD: S, NT, ND, +BS. No rebound. No HSM. EXTR: No c/c/e PSYCH: Normally interactive. Conversant.   EKG:  NSR, no ST changes noted Rate 85 BPM Assessment and Plan: SOB (shortness of breath) - Plan: EKG 12-Lead, D-Dimer, Quantitative, CBC, Basic metabolic panel, Troponin I (High Sensitivity), DG Chest 2 View  Prediabetes  Other polyneuropathy  Tachycardia - Plan: EKG 12-Lead  Blood in stool - Plan: Ambulatory referral to Gastroenterology, CBC, Ambulatory referral to Gastroenterology  Vitamin B12 deficiency - Plan: B12  Here today with a few concerns He requests a vitamin B12 level, will do this today Shortness of breath-doubt CAD given normal EKG and young age, patient is a non-smoker.  Will obtain a troponin today, also chest film.  Also consider PE, especially during COVID-19 pandemic.  Will obtain D-dimer If all these are normal, we can refer to cardiology and/or do a Zio patch monitor.  The patient will keep me posted as to his symptoms Blood in stool/mouth sores.  The blood in stool seems likely related to an anal fissure- however as it has occurred several times and he also has c/o mouth sores I will refer him to GI. He is asked to seek  care right away if any more serious bleeding were to occur  Moderate medical decision making today  This visit occurred during the SARS-CoV-2 public health emergency.  Safety protocols were in place, including screening questions prior to the visit, additional usage of staff PPE, and extensive cleaning of exam room while observing appropriate contact time as indicated for disinfecting solutions.     Signed Abbe Amsterdam, MD  Received his labs and chest film as below, message to patient  Results for orders placed or performed in visit on 11/14/19  D-Dimer, Quantitative  Result Value Ref Range  D-Dimer, Quant <0.19 <0.50 mcg/mL FEU  CBC  Result Value Ref Range   WBC 6.2 4.0 - 10.5 K/uL   RBC 5.04 4.22 - 5.81 Mil/uL   Platelets 230.0 150.0 - 400.0 K/uL   Hemoglobin 14.3 13.0 - 17.0 g/dL   HCT 18.5 63.1 - 49.7 %   MCV 84.5 78.0 - 100.0 fl   MCHC 33.5 30.0 - 36.0 g/dL   RDW 02.6 37.8 - 58.8 %  Basic metabolic panel  Result Value Ref Range   Sodium 138 135 - 145 mEq/L   Potassium 4.1 3.5 - 5.1 mEq/L   Chloride 103 96 - 112 mEq/L   CO2 29 19 - 32 mEq/L   Glucose, Bld 87 70 - 99 mg/dL   BUN 17 6 - 23 mg/dL   Creatinine, Ser 5.02 0.40 - 1.50 mg/dL   GFR 774.12 >87.86 mL/min   Calcium 9.2 8.4 - 10.5 mg/dL  V67  Result Value Ref Range   Vitamin B-12 1,327 (H) 211 - 911 pg/mL  Troponin I (High Sensitivity)  Result Value Ref Range   High Sens Troponin I <2 2 - 17 ng/L   DG Chest 2 View  Result Date: 11/14/2019 CLINICAL DATA:  Shortness of breath. EXAM: CHEST - 2 VIEW COMPARISON:  Nov 29, 2011. FINDINGS: The heart size and mediastinal contours are within normal limits. Both lungs are clear. No pneumothorax or pleural effusion is noted. The visualized skeletal structures are unremarkable. IMPRESSION: No active cardiopulmonary disease. Electronically Signed   By: Lupita Raider M.D.   On: 11/14/2019 12:56   Received he rest of his labs 4/22- message to pt

## 2019-11-14 ENCOUNTER — Encounter: Payer: Self-pay | Admitting: Family Medicine

## 2019-11-14 ENCOUNTER — Ambulatory Visit
Admission: RE | Admit: 2019-11-14 | Discharge: 2019-11-14 | Disposition: A | Discharge status: Home / Self Care | Payer: Commercial Managed Care - PPO | Source: Ambulatory Visit | Attending: Family Medicine | Admitting: Family Medicine

## 2019-11-14 ENCOUNTER — Ambulatory Visit: Payer: Commercial Managed Care - PPO | Admitting: Family Medicine

## 2019-11-14 ENCOUNTER — Telehealth: Payer: Self-pay | Admitting: *Deleted

## 2019-11-14 ENCOUNTER — Other Ambulatory Visit: Payer: Self-pay

## 2019-11-14 VITALS — BP 105/73 | HR 82 | Temp 97.1°F | Resp 17 | Ht 68.5 in | Wt 165.0 lb

## 2019-11-14 DIAGNOSIS — E538 Deficiency of other specified B group vitamins: Secondary | ICD-10-CM

## 2019-11-14 DIAGNOSIS — R7303 Prediabetes: Secondary | ICD-10-CM

## 2019-11-14 DIAGNOSIS — R0602 Shortness of breath: Secondary | ICD-10-CM | POA: Diagnosis not present

## 2019-11-14 DIAGNOSIS — K921 Melena: Secondary | ICD-10-CM

## 2019-11-14 DIAGNOSIS — G6289 Other specified polyneuropathies: Secondary | ICD-10-CM | POA: Diagnosis not present

## 2019-11-14 DIAGNOSIS — R000 Tachycardia, unspecified: Secondary | ICD-10-CM

## 2019-11-14 LAB — BASIC METABOLIC PANEL
BUN: 17 mg/dL (ref 6–23)
CO2: 29 mEq/L (ref 19–32)
Calcium: 9.2 mg/dL (ref 8.4–10.5)
Chloride: 103 mEq/L (ref 96–112)
Creatinine, Ser: 0.71 mg/dL (ref 0.40–1.50)
GFR: 123.73 mL/min (ref >60.00)
Glucose, Bld: 87 mg/dL (ref 70–99)
Potassium: 4.1 mEq/L (ref 3.5–5.1)
Sodium: 138 mEq/L (ref 135–145)

## 2019-11-14 LAB — D-DIMER, QUANTITATIVE: D-Dimer, Quant: <0.19 mcg/mL FEU (ref <0.50)

## 2019-11-14 LAB — TROPONIN I (HIGH SENSITIVITY): High Sens Troponin I: <2 ng/L (ref 2–17)

## 2019-11-14 LAB — VITAMIN B12: Vitamin B-12: 1327 pg/mL — ABNORMAL HIGH (ref 211–911)

## 2019-11-14 NOTE — Telephone Encounter (Signed)
Left message for pt to return my call ASAP.  STAT d-dimer was ordered. Incorrect tube was drawn and pt will need re-collection.

## 2019-11-14 NOTE — Patient Instructions (Addendum)
It was good to see you again today- I will be in touch with your labs and x-ray asap  We will get you set up to see GI to discuss recurrent rectal bleeding  If your chest symptoms worsen please seek immediate care

## 2019-11-14 NOTE — Telephone Encounter (Signed)
Pt returned and correct specimen was drawn.

## 2019-11-15 ENCOUNTER — Encounter: Payer: Self-pay | Admitting: Family Medicine

## 2019-11-15 LAB — CBC
HCT: 42.6 % (ref 39.0–52.0)
Hemoglobin: 14.3 g/dL (ref 13.0–17.0)
MCHC: 33.5 g/dL (ref 30.0–36.0)
MCV: 84.5 fl (ref 78.0–100.0)
Platelets: 230 10*3/uL (ref 150.0–400.0)
RBC: 5.04 Mil/uL (ref 4.22–5.81)
RDW: 13.6 % (ref 11.5–15.5)
WBC: 6.2 10*3/uL (ref 4.0–10.5)

## 2019-12-03 ENCOUNTER — Other Ambulatory Visit: Payer: Self-pay | Admitting: Family Medicine

## 2019-12-03 ENCOUNTER — Encounter: Payer: Self-pay | Admitting: Family Medicine

## 2019-12-03 DIAGNOSIS — G6289 Other specified polyneuropathies: Secondary | ICD-10-CM

## 2019-12-03 DIAGNOSIS — R202 Paresthesia of skin: Secondary | ICD-10-CM

## 2019-12-03 NOTE — Progress Notes (Signed)
Received notes from [facility] neurology about some labs they would like the pt to have.  I will order for him to have drawn at his convenience   Dr Bernestine Amass attn

## 2019-12-05 ENCOUNTER — Other Ambulatory Visit: Payer: Self-pay | Admitting: Emergency Medicine

## 2019-12-05 NOTE — Addendum Note (Signed)
Addended by: Harley Alto on: 12/05/2019 09:47 AM   Modules accepted: Orders

## 2019-12-07 ENCOUNTER — Other Ambulatory Visit: Payer: Commercial Managed Care - PPO

## 2019-12-07 DIAGNOSIS — R202 Paresthesia of skin: Secondary | ICD-10-CM

## 2019-12-07 DIAGNOSIS — G6289 Other specified polyneuropathies: Secondary | ICD-10-CM

## 2019-12-10 ENCOUNTER — Encounter: Payer: Self-pay | Admitting: Family Medicine

## 2019-12-10 LAB — IRON,TIBC AND FERRITIN PANEL
%SAT: 16 % (calc) — ABNORMAL LOW (ref 20–48)
Ferritin: 21 ng/mL — ABNORMAL LOW (ref 38–380)
Iron: 59 ug/dL (ref 50–180)
TIBC: 372 mcg/dL (calc) (ref 250–425)

## 2019-12-10 LAB — SJOGRENS SYNDROME-B EXTRACTABLE NUCLEAR ANTIBODY: SSB (La) (ENA) Antibody, IgG: <1 AI

## 2019-12-10 LAB — B. BURGDORFI ANTIBODIES: B burgdorferi Ab IgG+IgM: <0.9 index

## 2019-12-10 LAB — ZINC: Zinc: 60 ug/dL (ref 60–130)

## 2019-12-10 LAB — CERULOPLASMIN: Ceruloplasmin: 29 mg/dL (ref 18–36)

## 2019-12-10 LAB — SJOGRENS SYNDROME-A EXTRACTABLE NUCLEAR ANTIBODY: SSA (Ro) (ENA) Antibody, IgG: <1 AI

## 2019-12-11 LAB — CELIAC DISEASE ANTIBODY SCREEN
Antigliadin Abs, IgA: 3 units (ref 0–19)
IgA/Immunoglobulin A, Serum: 252 mg/dL (ref 90–386)
Transglutaminase IgA: <2 U/mL (ref 0–3)

## 2019-12-22 ENCOUNTER — Encounter: Payer: Self-pay | Admitting: Family Medicine

## 2020-03-24 ENCOUNTER — Encounter: Payer: Self-pay | Admitting: Family Medicine

## 2020-03-24 ENCOUNTER — Telehealth: Payer: Self-pay | Admitting: Family Medicine

## 2020-03-24 DIAGNOSIS — N529 Male erectile dysfunction, unspecified: Secondary | ICD-10-CM

## 2020-03-24 NOTE — Telephone Encounter (Signed)
Caller Joe Ray  Call Back # [phone]  Patient states numbness and tinging in the legs and feet.  Patient states he would like to be seen or prescribed something for problem.

## 2020-03-25 MED ORDER — DULOXETINE HCL 30 MG PO CPEP: 30.0000 mg | ORAL_CAPSULE | Freq: Two times a day (BID) | ORAL | 1 refills | Status: DC

## 2020-03-26 NOTE — Addendum Note (Signed)
Addended by: Abbe Amsterdam C on: 03/26/2020 05:31 AM   Modules accepted: Orders

## 2020-04-26 ENCOUNTER — Encounter: Payer: Self-pay | Admitting: Family Medicine

## 2020-04-28 ENCOUNTER — Other Ambulatory Visit: Payer: Self-pay

## 2020-04-28 ENCOUNTER — Ambulatory Visit (INDEPENDENT_AMBULATORY_CARE_PROVIDER_SITE_OTHER): Payer: Commercial Managed Care - PPO | Admitting: Family Medicine

## 2020-04-28 ENCOUNTER — Encounter: Payer: Self-pay | Admitting: Family Medicine

## 2020-04-28 VITALS — BP 118/74 | HR 84 | Temp 98.6°F | Resp 16 | Wt 169.9 lb

## 2020-04-28 DIAGNOSIS — R200 Anesthesia of skin: Secondary | ICD-10-CM | POA: Diagnosis not present

## 2020-04-28 DIAGNOSIS — E611 Iron deficiency: Secondary | ICD-10-CM | POA: Diagnosis not present

## 2020-04-28 DIAGNOSIS — M6281 Muscle weakness (generalized): Secondary | ICD-10-CM

## 2020-04-28 NOTE — Patient Instructions (Addendum)
Good to see you again today- you need a flu shot but will hold off today due to unusual symptoms I will be in touch with your labs We are getting an antibody level to help [place] determine if you might have something called myasthenia gravis.    Please let me know if anything changes or gets worse in the meantime

## 2020-04-28 NOTE — Progress Notes (Addendum)
[facility] [address] [place], [place] [address] [phone]  Date:  04/28/2020   Name:  Joe Ray   DOB:  1980-11-19   MRN:  [id number]  PCP:  Pearline Cables, MD    Chief Complaint: Follow-up (face tingling/ numbness in face)   History of Present Illness:  Joe Ray is a 39 y.o. very pleasant male patient who presents with the following:  Patient with history of prediabetes, peripheral neuropathy He has had concern of pain and numbness in his lower extremities for at least 2 years, has consulted with 2 different neurology practices-most recently you saw Dr. Milon Dikes with [facility] neurology.  No definite explanation for his symptoms has been found, we have looked at both vitamin B12 and ferritin deficiency as possible culprits  Towards the end of August he contacted me with worsening of the extremity discomfort. Restarted back on Cymbalta which he had used previously for this issue.  He is also been bothered by erectile dysfunction  He then contacted me over the weekend with the following concern: I have an issue. Later day or so, I have had numbness and tingling feeling in my head. I have to hold a cold pillow to my head and lie down for hours for it to stop.. Same type of feeling I get when feet and hands fall asleep.Marland Kitchen is there any way you can see me Monday morning.. I am so tired right now and exhausted. Any way you can squeeze me in would be great.   Pt notes that the same sensation he gets with his hand and foot numbness/ tingling seemed to move into his face this past Saturday- today is Monday. He felt a burning/ tingling feeling in his bilateral face.   No drooping of his face, no problems speaking/ swallowing, no slurred speech or any other unusual neurologic symptoms He did not see any twitching or fasciculations in his face- just felt very strange He noted it while driving home from shopping He put some  cool pillows on his head and it finally went away after approx 2 hours He drank cool water and took ibuprofen He had a similar thing 2 more times the same day, and once yesterday and once today  He notes that his foot sx are a bit worse overall His hand sx come and go- he cannot determine why, but the sx will wax and wane  He feels like gripping something will cause his hands to hurt He feels like he has some weakness in his arms and legs He notes that muscle bulk in his forearms and hands seems to be decreased  No fever or other sx of illness He did check his temp and it was ok BP was 118/77 when this occurred   Flu vaccine-deferred today due to unusual symptoms covid series:  Complete   His ferritin was 21 when he was seen by [facility] neurology- otherwise labs were ok per his knowledge He is taking an iron supplement- and folic acid -at this time  Patient Active Problem List   Diagnosis Date Noted  . Prediabetes 11/13/2019  . Peripheral neuropathy 11/13/2019  . G E R D 06/30/2010  . COUGH 06/30/2010    Past Medical History:  Diagnosis Date  . Allergy     No past surgical history on file.  Social History   Tobacco Use  . Smoking status: Never Smoker  . Smokeless  tobacco: Never Used  Vaping Use  . Vaping Use: Never used  Substance Use Topics  . Alcohol use: Never  . Drug use: Never    Family History  Problem Relation Age of Onset  . Diabetes Father     Allergies  Allergen Reactions  . Amoxicillin Diarrhea and Rash    Medication list has been reviewed and updated.  Current Outpatient Medications on File Prior to Visit  Medication Sig Dispense Refill  . Cholecalciferol (VITAMIN D-3) 1000 units CAPS Take by mouth.    . DULoxetine (CYMBALTA) 30 MG capsule Take 1 capsule (30 mg total) by mouth 2 (two) times daily. 60 capsule 1  . DULoxetine (CYMBALTA) 60 MG capsule Take 1 capsule (60 mg total) by mouth 2 (two) times daily. 60 capsule 3  . Methylcobalamin 1000 MCG  TBDP Take by mouth.    . Multiple Vitamin (MULTIVITAMIN) tablet Take 1 tablet by mouth daily.     No current facility-administered medications on file prior to visit.    Review of Systems:  As per HPI- otherwise negative.   Physical Examination: Vitals:   04/28/20 1204  BP: 118/74  Pulse: 84  Resp: 16  Temp: 98.6 F (37 C)  SpO2: 97%   Vitals:   04/28/20 1204  Weight: 169 lb 14.4 oz (77.1 kg)   Body mass index is 25.46 kg/m. Ideal Body Weight:    GEN: no acute distress.  Normal weight, looks well and his normal self HEENT: Atraumatic, Normocephalic.   Bilateral TM wnl, oropharynx normal.  PEERL,EOMI.   Ears and Nose: No external deformity. CV: RRR, No M/G/R. No JVD. No thrill. No extra heart sounds. PULM: CTA B, no wheezes, crackles, rhonchi. No retractions. No resp. distress. No accessory muscle use. ABD: S, NT, ND, +BS. No rebound. No HSM. EXTR: No c/c/e PSYCH: Normally interactive. Conversant.  Normal facial strength.  No drooping is noted Patient does have minimal weakness in the upper and lower extremities which is symmetrical bilaterally.  Most noticeable in hamstrings and quads Reflexes are normal   Assessment and Plan: Facial numbness - Plan: CBC, Comprehensive metabolic panel, Sedimentation rate, Folate, B12, Magnesium  Iron deficiency - Plan: Ferritin  Muscle weakness - Plan: Acetylcholine Receptor Ab, All, Magnesium  Patient has been troubled by extremity paresthesias for 2 to 3 years.  This has been gradually getting worse.  He now also has noted erectile dysfunction, and more recently the paresthesias have also affected his face.  He has been seen by 2 different neurology practices. On exam today I do think he has decreased muscle bulk in his forearms and calves, and I am able to overcome his strength on strength testing  At this time I advised patient that I do suspect he has a neurologic disorder of some type, unfortunately we have not been able to  identify this as of yet  We discussed his facial symptoms and possibility of stroke.  His facial numbness does not seem typical of stroke symptoms, patient agrees and at this time declines any additional neuroimaging  He does mention progressive muscle weakness which gets worse with use.  Consider myasthenia gravis, will obtain acetylcholine receptor antibody today Further follow-up pending labs.  I have encouraged him to follow-up with [facility] neurology at his earliest convenience, he will let me know if any change or worsening of his sx  This visit occurred during the SARS-CoV-2 public health emergency.  Safety protocols were in place, including screening questions prior to the visit,  additional usage of staff PPE, and extensive cleaning of exam room while observing appropriate contact time as indicated for disinfecting solutions.    Signed Abbe Amsterdam, MD  addnd 10/5- received his labs as below  Results for orders placed or performed in visit on 04/28/20  CBC  Result Value Ref Range   WBC 7.1 3.8 - 10.8 Thousand/uL   RBC 5.31 4.20 - 5.80 Million/uL   Hemoglobin 14.6 13.2 - 17.1 g/dL   HCT 36.6 38 - 50 %   MCV 83.2 80.0 - 100.0 fL   MCH 27.5 27.0 - 33.0 pg   MCHC 33.0 32.0 - 36.0 g/dL   RDW 44.0 34.7 - 42.5 %   Platelets 298 140 - 400 Thousand/uL   MPV 11.3 7.5 - 12.5 fL  Comprehensive metabolic panel  Result Value Ref Range   Glucose, Bld 83 65 - 99 mg/dL   BUN 19 7 - 25 mg/dL   Creat 9.56 3.87 - 5.64 mg/dL   BUN/Creatinine Ratio NOT APPLICABLE 6 - 22 (calc)   Sodium 139 135 - 146 mmol/L   Potassium 4.5 3.5 - 5.3 mmol/L   Chloride 103 98 - 110 mmol/L   CO2 27 20 - 32 mmol/L   Calcium 9.8 8.6 - 10.3 mg/dL   Total Protein 6.8 6.1 - 8.1 g/dL   Albumin 4.4 3.6 - 5.1 g/dL   Globulin 2.4 1.9 - 3.7 g/dL (calc)   AG Ratio 1.8 1.0 - 2.5 (calc)   Total Bilirubin 0.3 0.2 - 1.2 mg/dL   Alkaline phosphatase (APISO) 82 36 - 130 U/L   AST 22 10 - 40 U/L   ALT 27 9 - 46 U/L   Sedimentation rate  Result Value Ref Range   Sed Rate 6 0 - 15 mm/h  Ferritin  Result Value Ref Range   Ferritin 37 (L) 38 - 380 ng/mL  Folate  Result Value Ref Range   Folate >24.0 ng/mL  B12  Result Value Ref Range   Vitamin B-12 586 200 - 1,100 pg/mL  Magnesium  Result Value Ref Range   Magnesium 2.2 1.5 - 2.5 mg/dL   Message to pt

## 2020-04-29 ENCOUNTER — Encounter: Payer: Self-pay | Admitting: Family Medicine

## 2020-04-29 LAB — COMPREHENSIVE METABOLIC PANEL
AG Ratio: 1.8 (calc) (ref 1.0–2.5)
ALT: 27 U/L (ref 9–46)
AST: 22 U/L (ref 10–40)
Albumin: 4.4 g/dL (ref 3.6–5.1)
Alkaline phosphatase (APISO): 82 U/L (ref 36–130)
BUN: 19 mg/dL (ref 7–25)
CO2: 27 mmol/L (ref 20–32)
Calcium: 9.8 mg/dL (ref 8.6–10.3)
Chloride: 103 mmol/L (ref 98–110)
Creat: 0.86 mg/dL (ref 0.60–1.35)
Globulin: 2.4 g/dL (calc) (ref 1.9–3.7)
Glucose, Bld: 83 mg/dL (ref 65–99)
Potassium: 4.5 mmol/L (ref 3.5–5.3)
Sodium: 139 mmol/L (ref 135–146)
Total Bilirubin: 0.3 mg/dL (ref 0.2–1.2)
Total Protein: 6.8 g/dL (ref 6.1–8.1)

## 2020-04-29 LAB — VITAMIN B12: Vitamin B-12: 586 pg/mL (ref 200–1100)

## 2020-04-29 LAB — CBC
HCT: 44.2 % (ref 38.5–50.0)
Hemoglobin: 14.6 g/dL (ref 13.2–17.1)
MCH: 27.5 pg (ref 27.0–33.0)
MCHC: 33 g/dL (ref 32.0–36.0)
MCV: 83.2 fL (ref 80.0–100.0)
MPV: 11.3 fL (ref 7.5–12.5)
Platelets: 298 10*3/uL (ref 140–400)
RBC: 5.31 10*6/uL (ref 4.20–5.80)
RDW: 13.1 % (ref 11.0–15.0)
WBC: 7.1 10*3/uL (ref 3.8–10.8)

## 2020-04-29 LAB — FOLATE: Folate: >24 ng/mL

## 2020-04-29 LAB — SEDIMENTATION RATE: Sed Rate: 6 mm/h (ref 0–15)

## 2020-04-29 LAB — MAGNESIUM: Magnesium: 2.2 mg/dL (ref 1.5–2.5)

## 2020-04-29 LAB — FERRITIN: Ferritin: 37 ng/mL — ABNORMAL LOW (ref 38–380)

## 2020-05-06 ENCOUNTER — Encounter: Payer: Self-pay | Admitting: Family Medicine

## 2020-05-28 ENCOUNTER — Telehealth: Payer: Self-pay | Admitting: Family Medicine

## 2020-05-28 LAB — ACETYLCHOLINE RECEPTOR AB, ALL
AChR Binding Ab, Serum: <0.03 nmol/L (ref 0.00–0.24)
Acetylchol Block Ab: 18 % (ref 0–25)

## 2020-05-28 NOTE — Telephone Encounter (Signed)
Caller name: Sharia Reeve - [facility] Call back number: [phone]  Josh need authorization to send lab [facility] Modulat antibody) to a different lab ([facility])

## 2020-05-29 NOTE — Telephone Encounter (Signed)
Called [facility]-spoke with rep. They stated this was just an fyi for [place]. No information or actions needed from our office.

## 2020-06-24 ENCOUNTER — Encounter: Payer: Self-pay | Admitting: Family Medicine

## 2020-06-26 ENCOUNTER — Other Ambulatory Visit: Payer: Self-pay

## 2020-06-26 ENCOUNTER — Encounter: Payer: Self-pay | Admitting: Family Medicine

## 2020-06-26 ENCOUNTER — Ambulatory Visit: Payer: Commercial Managed Care - PPO | Admitting: Family Medicine

## 2020-06-26 VITALS — BP 122/82 | HR 96 | Resp 17 | Ht 68.5 in | Wt 174.0 lb

## 2020-06-26 DIAGNOSIS — M79671 Pain in right foot: Secondary | ICD-10-CM | POA: Diagnosis not present

## 2020-06-26 DIAGNOSIS — M79672 Pain in left foot: Secondary | ICD-10-CM

## 2020-06-26 DIAGNOSIS — G6289 Other specified polyneuropathies: Secondary | ICD-10-CM

## 2020-06-26 DIAGNOSIS — M79642 Pain in left hand: Secondary | ICD-10-CM | POA: Diagnosis not present

## 2020-06-26 DIAGNOSIS — M79641 Pain in right hand: Secondary | ICD-10-CM

## 2020-06-26 DIAGNOSIS — Z23 Encounter for immunization: Secondary | ICD-10-CM

## 2020-06-26 MED ORDER — DULOXETINE HCL 30 MG PO CPEP: 30.0000 mg | ORAL_CAPSULE | Freq: Two times a day (BID) | ORAL | 3 refills | Status: DC

## 2020-06-26 NOTE — Patient Instructions (Signed)
It was good to see you again today-  I am so sorry you have this pain.  Please go to [facility] at [address] at your convenience to have x-rays of your hands and feet.  I will be in touch with your reports Please let me know what the neurologist says!

## 2020-06-26 NOTE — Progress Notes (Signed)
[facility] [address] [place], [place] [address] [phone]  Date:  06/26/2020   Name:  Joe Ray   DOB:  [birth year]   MRN:  [id number]  PCP:  Pearline Cables, MD    Chief Complaint: No chief complaint on file.   History of Present Illness:  Joe Ray is a 39 y.o. very pleasant male patient who presents with the following:  Patient who is generally healthy except for prediabetes and neuropathy. Last seen by myself in October of this year He has had concern of pain and numbness in his bilateral lower extremities for over 2 years, we have done a fairly exhaustive work-up for this condition and have unfortunately not come to an explanation In October he was noting a numbness and tingling also in his head I referred him for a second opinion with neurology, but this appointment is not until February Recently, he contacted me with concern of pains in his hands and feet to the point that he thought his bones might break, he wondered if we could check his bone density  He is having discrete episodes of very severe pain which tend to occur if he puts pressure on a hand or foot.  He describes for example if he will reach for something while standing and puts pressure on his right foot he will get a sharp pain in the foot, per his report it feels as though the bone might actually break Will last for a minute or so  He will get some burning sensation in his hands  Also has episodic tingling in his head - this is worse with any temperature change.    He is seeing neurology virtually but not until late December, his in person visit is not until March   Give flu shot today  He had his covid shots already- no booster yet. He will do this soon   Pulse Readings from Last 3 Encounters:  06/26/20 (!) 105  04/28/20 84  11/14/19 82     Lab Results  Component Value Date   HGBA1C 5.8 09/17/2019     Patient Active  Problem List   Diagnosis Date Noted  . Prediabetes 11/13/2019  . Peripheral neuropathy 11/13/2019  . G E R D 06/30/2010  . COUGH 06/30/2010    Past Medical History:  Diagnosis Date  . Allergy     No past surgical history on file.  Social History   Tobacco Use  . Smoking status: Never Smoker  . Smokeless tobacco: Never Used  Vaping Use  . Vaping Use: Never used  Substance Use Topics  . Alcohol use: Never  . Drug use: Never    Family History  Problem Relation Age of Onset  . Diabetes Father     Allergies  Allergen Reactions  . Amoxicillin Diarrhea and Rash    Medication list has been reviewed and updated.  Current Outpatient Medications on File Prior to Visit  Medication Sig Dispense Refill  . Cholecalciferol (VITAMIN D-3) 1000 units CAPS Take by mouth.    . DULoxetine (CYMBALTA) 30 MG capsule Take 1 capsule (30 mg total) by mouth 2 (two) times daily. 60 capsule 1  . Methylcobalamin 1000 MCG TBDP Take by mouth.    . Multiple Vitamin (MULTIVITAMIN) tablet Take 1 tablet by mouth daily.    . sildenafil (REVATIO) 20 MG tablet Take 20-40 mg by mouth daily as needed.  No current facility-administered medications on file prior to visit.    Review of Systems:  As per HPI- otherwise negative.   Physical Examination: Vitals:   06/26/20 1616  BP: 122/82  Pulse: (!) 105  Resp: 17  SpO2: 99%   Vitals:   06/26/20 1616  Weight: 174 lb (78.9 kg)  Height: 5' 8.5" (1.74 m)   Body mass index is 26.07 kg/m. Ideal Body Weight: Weight in (lb) to have BMI = 25: 166.5  GEN: no acute distress.  Mild overweight, looks well HEENT: Atraumatic, Normocephalic.  Ears and Nose: No external deformity. CV: RRR, No M/G/R. No JVD. No thrill. No extra heart sounds. PULM: CTA B, no wheezes, crackles, rhonchi. No retractions. No resp. distress. No accessory muscle use. ABD: S, NT, ND, +BS. No rebound. No HSM. EXTR: No c/c/e PSYCH: Normally interactive. Conversant.   Bilateral feet and hands are normal to exam today.  No heat, swelling, redness, normal circulation   Assessment and Plan: Pain in both hands - Plan: DG Hand 2 View Right, DG Hand 2 View Left  Pain in both feet - Plan: DG Foot 2 Views Left, DG Foot 2 Views Right  Immunization due  Needs flu shot - Plan: Flu Vaccine QUAD 6+ mos PF IM (Fluarix Quad PF)  Other polyneuropathy - Plan: DULoxetine (CYMBALTA) 30 MG capsule  Patient here today to follow-up on continued unusual pain and numbness in his bilateral hands and bilateral feet.  This is been going on for more than 2 years, despite extensive evaluation we have not been able to determine a cause.  He is noticing some other possibly neurologic symptoms including generalized weakness and fatigue, burning sensation on his skin-we have him set up for a second opinion with neurology at the end of this month  In the meantime, we will obtain plain films of both hands and both feet as he is worried there could be a bone problem or risk of fracture  This visit occurred during the SARS-CoV-2 public health emergency.  Safety protocols were in place, including screening questions prior to the visit, additional usage of staff PPE, and extensive cleaning of exam room while observing appropriate contact time as indicated for disinfecting solutions.     Signed Abbe Amsterdam, MD

## 2020-06-30 ENCOUNTER — Encounter: Payer: Self-pay | Admitting: Family Medicine

## 2020-06-30 ENCOUNTER — Ambulatory Visit
Admission: RE | Admit: 2020-06-30 | Discharge: 2020-06-30 | Disposition: A | Discharge status: Home / Self Care | Payer: Commercial Managed Care - PPO | Source: Ambulatory Visit | Attending: Family Medicine | Admitting: Family Medicine

## 2020-06-30 DIAGNOSIS — M79672 Pain in left foot: Secondary | ICD-10-CM

## 2020-06-30 DIAGNOSIS — M79641 Pain in right hand: Secondary | ICD-10-CM

## 2020-06-30 DIAGNOSIS — M79671 Pain in right foot: Secondary | ICD-10-CM

## 2020-06-30 DIAGNOSIS — M79642 Pain in left hand: Secondary | ICD-10-CM

## 2020-07-24 ENCOUNTER — Encounter: Payer: Self-pay | Admitting: Family Medicine

## 2020-07-24 DIAGNOSIS — E611 Iron deficiency: Secondary | ICD-10-CM

## 2020-07-24 DIAGNOSIS — G6289 Other specified polyneuropathies: Secondary | ICD-10-CM

## 2020-07-30 ENCOUNTER — Telehealth: Payer: Self-pay | Admitting: Hematology and Oncology

## 2020-07-30 NOTE — Telephone Encounter (Signed)
Received a new hem referral from Dr. Patsy Lager for iron deficiency. Mr. Joe Ray has been cld and scheduled to see Dr. Pamelia Hoit on 1/10 at 1pm. Pt aware to arrive 20 minutes early.

## 2020-08-03 NOTE — Progress Notes (Signed)
[facility] CONSULT NOTE  Patient Care Team: Copland, Gwenlyn Found, MD as PCP - General (Family Medicine) Sedalia Muta, PT as Physical Therapist (Physical Therapy)  CHIEF COMPLAINTS/PURPOSE OF CONSULTATION:  Newly diagnosed iron deficiency  HISTORY OF PRESENTING ILLNESS:  Joe Ray 40 y.o. male is here because of recent diagnosis of iron deficiency. He is referred by Dr. Patsy Lager. Labs on 04/28/20 showed Hg 14.6, HCT 44.2, ferritin 37, sed rate 6, B-12 1327, and on 12/07/19 showed iron saturation 16%, ferritin 21. He presents to the clinic today for initial evaluation. Over the past 2 years Joe Ray has been having significant pain in his feet accompanied by numbness. Initially was diagnosed with B12 deficiency and went on B12 replacement therapy. His symptoms initially improved but then they started progressing with worsening numbness in his hands as well as even facial numbness. He had seen neurology both in [place] as well as in [facility]. There is a clinical suspicion that he may have autoimmune disease. As part of this work-up iron studies were performed which showed mild iron deficiency. He was sent to [place] for discussion regarding iron replacement therapy. He also has profound fatigue accompanying all of the symptoms.  I reviewed his records extensively and collaborated the history with the patient.  MEDICAL HISTORY:  Severe peripheral sensory and motor neuropathy Past Medical History:  Diagnosis Date  . Allergy     SURGICAL HISTORY: No prior surgeries SOCIAL HISTORY:  Vegetarian. Denies any tobacco or alcohol or recreational drug use FAMILY HISTORY: No family history of neurological disorders or cancers Family History  Problem Relation Age of Onset  . Diabetes Father     ALLERGIES:  is allergic to amoxicillin.  MEDICATIONS:  Current Outpatient Medications  Medication Sig Dispense Refill  . Cholecalciferol (VITAMIN D-3) 1000 units CAPS Take by  mouth.    . DULoxetine (CYMBALTA) 30 MG capsule Take 1 capsule (30 mg total) by mouth 2 (two) times daily. 180 capsule 3  . Methylcobalamin 1000 MCG TBDP Take by mouth.    . Multiple Vitamin (MULTIVITAMIN) tablet Take 1 tablet by mouth daily.    . sildenafil (REVATIO) 20 MG tablet Take 20-40 mg by mouth daily as needed.      No current facility-administered medications for this visit.    REVIEW OF SYSTEMS:   Constitutional: Severe fatigue Eyes: Denies blurriness of vision, double vision or watery eyes Ears, nose, mouth, throat, and face: Denies mucositis or sore throat Respiratory: Denies cough, dyspnea or wheezes Cardiovascular: Denies palpitation, chest discomfort or lower extremity swelling Gastrointestinal:  Denies nausea, heartburn or change in bowel habits Skin: Denies abnormal skin rashes Lymphatics: Denies new lymphadenopathy or easy bruising Neurological: Severe peripheral motor sensory neuropathy Behavioral/Psych: Mood is stable, no new changes All other systems were reviewed with the patient and are negative.  PHYSICAL EXAMINATION: ECOG PERFORMANCE STATUS: 2 - Symptomatic, <50% confined to bed  Vitals:   08/04/20 1254  BP: 117/79  Pulse: 98  Resp: 20  Temp: 97.7 F (36.5 C)  SpO2: 99%   Filed Weights   08/04/20 1254  Weight: 172 lb 12.8 oz (78.4 kg)    GENERAL:alert, no distress and comfortable SKIN: skin color, texture, turgor are normal, no rashes or significant lesions EYES: normal, conjunctiva are pink and non-injected, sclera clear OROPHARYNX:no exudate, no erythema and lips, buccal mucosa, and tongue normal  NECK: supple, thyroid normal size, non-tender, without nodularity LYMPH:  no palpable lymphadenopathy in the cervical, axillary or inguinal LUNGS:  clear to auscultation and percussion with normal breathing effort HEART: regular rate & rhythm and no murmurs and no lower extremity edema ABDOMEN:abdomen soft, non-tender and normal bowel  sounds Musculoskeletal:no cyanosis of digits and no clubbing  PSYCH: alert & oriented x 3 with fluent speech NEURO: no focal motor/sensory deficits  LABORATORY DATA:  I have reviewed the data as listed Lab Results  Component Value Date   WBC 7.1 04/28/2020   HGB 14.6 04/28/2020   HCT 44.2 04/28/2020   MCV 83.2 04/28/2020   PLT 298 04/28/2020   Lab Results  Component Value Date   NA 139 04/28/2020   K 4.5 04/28/2020   CL 103 04/28/2020   CO2 27 04/28/2020      ASSESSMENT AND PLAN:  Iron deficiency 04/28/2020: Hemoglobin 14.6, MCV 83.2, RDW 13.1, platelets 298, WBC 7.1, normal CMP, ferritin 37 (33 about 1 year ago, lowest ferritin was 16), folic acid greater than 24, B12 586, previously celiac antibodies were negative  Mild iron deficiency without anemia: Given his extraordinary amount of neuropathy without any clear-cut etiology and lack of response to B12 replacement therapy, we will attempt to treat him with intravenous iron therapy. I was skeptical that the IV iron is going to help him significantly but we will give this a try. If he does not improve then we can consider additional oral iron replacement therapies for maintenance.  Peripheral neuropathy: Unclear etiology, patient sees Dr. Marjory Lies as well as another neurologist at [facility]. He had MRIs in the past. Patient works at [employer] and stays extremely busy.  We discussed the iron absorption pathway. 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 This is accompanied by vegetarian diet, might be the cause of his low iron levels.  Return to clinic 1 month after finishing IV iron therapy to recheck his labs and follow-up virtually. All questions were answered. The patient knows to call the clinic with any problems, questions or concerns.    Sabas Sous, MD, MPH 08/04/2020    I, Molly Dorshimer, am acting as scribe for Serena Croissant, MD.  I have reviewed the above documentation for  accuracy and completeness, and I agree with the above.

## 2020-08-04 ENCOUNTER — Other Ambulatory Visit: Payer: Self-pay

## 2020-08-04 ENCOUNTER — Inpatient Hospital Stay: Payer: Commercial Managed Care - PPO | Attending: Hematology and Oncology | Admitting: Hematology and Oncology

## 2020-08-04 ENCOUNTER — Other Ambulatory Visit: Payer: Self-pay | Admitting: Hematology and Oncology

## 2020-08-04 DIAGNOSIS — E611 Iron deficiency: Secondary | ICD-10-CM | POA: Insufficient documentation

## 2020-08-04 DIAGNOSIS — E538 Deficiency of other specified B group vitamins: Secondary | ICD-10-CM | POA: Diagnosis not present

## 2020-08-04 DIAGNOSIS — G620 Drug-induced polyneuropathy: Secondary | ICD-10-CM

## 2020-08-04 NOTE — Assessment & Plan Note (Signed)
04/28/2020: Hemoglobin 14.6, MCV 83.2, RDW 13.1, platelets 298, WBC 7.1, normal CMP, ferritin 37 (33 1 year ago), folic acid greater than 24, B12 586  Mild iron deficiency without anemia: I discussed with the patient that his iron deficiency is considered extremely mild and stable and hence does not require treatment.  He does not need iron supplementation.  However I encouraged him to eat more iron-containing foods. Previous work-up including celiac antibodies were negative. Therefore no additional work-up would be necessary. Patient can be seen on an as-needed basis.

## 2020-08-12 ENCOUNTER — Inpatient Hospital Stay: Payer: Commercial Managed Care - PPO

## 2020-08-12 ENCOUNTER — Telehealth: Payer: Self-pay | Admitting: Hematology and Oncology

## 2020-08-12 NOTE — Telephone Encounter (Signed)
Rescheduled from 1/18. Called and spoke with pt confirmed new 1/28 appt

## 2020-08-14 ENCOUNTER — Other Ambulatory Visit: Payer: Self-pay

## 2020-08-14 ENCOUNTER — Inpatient Hospital Stay: Payer: Commercial Managed Care - PPO

## 2020-08-14 VITALS — BP 110/80 | HR 70 | Temp 97.8°F | Resp 18

## 2020-08-14 DIAGNOSIS — E611 Iron deficiency: Secondary | ICD-10-CM | POA: Diagnosis not present

## 2020-08-14 MED ORDER — SODIUM CHLORIDE 0.9 % IV SOLN
Freq: Once | INTRAVENOUS | Status: AC
  Filled 2020-08-14: qty 250

## 2020-08-14 MED ORDER — IRON SUCROSE 20 MG/ML IV SOLN
300.0000 mg | Freq: Once | INTRAVENOUS | Status: AC
  Administered 2020-08-14: 300 mg via INTRAVENOUS
  Filled 2020-08-14: qty 10

## 2020-08-14 NOTE — Patient Instructions (Signed)

## 2020-08-18 ENCOUNTER — Inpatient Hospital Stay: Payer: Commercial Managed Care - PPO

## 2020-08-18 ENCOUNTER — Other Ambulatory Visit: Payer: Self-pay

## 2020-08-18 VITALS — BP 108/71 | HR 71 | Temp 98.3°F | Resp 18

## 2020-08-18 DIAGNOSIS — E611 Iron deficiency: Secondary | ICD-10-CM | POA: Diagnosis not present

## 2020-08-18 MED ORDER — SODIUM CHLORIDE 0.9 % IV SOLN
300.0000 mg | Freq: Once | INTRAVENOUS | Status: AC
  Administered 2020-08-18: 300 mg via INTRAVENOUS
  Filled 2020-08-18: qty 10

## 2020-08-18 NOTE — Patient Instructions (Signed)

## 2020-08-20 ENCOUNTER — Ambulatory Visit: Payer: Commercial Managed Care - PPO

## 2020-08-22 ENCOUNTER — Inpatient Hospital Stay: Payer: Commercial Managed Care - PPO

## 2020-08-22 ENCOUNTER — Other Ambulatory Visit: Payer: Self-pay

## 2020-08-22 VITALS — BP 122/72 | HR 72 | Temp 98.2°F | Resp 18 | Wt 175.5 lb

## 2020-08-22 DIAGNOSIS — E611 Iron deficiency: Secondary | ICD-10-CM | POA: Diagnosis not present

## 2020-08-22 MED ORDER — SODIUM CHLORIDE 0.9 % IV SOLN
Freq: Once | INTRAVENOUS | Status: AC
  Filled 2020-08-22: qty 250

## 2020-08-22 MED ORDER — SODIUM CHLORIDE 0.9 % IV SOLN
300.0000 mg | Freq: Once | INTRAVENOUS | Status: AC
  Administered 2020-08-22: 300 mg via INTRAVENOUS
  Filled 2020-08-22: qty 10

## 2020-08-22 NOTE — Patient Instructions (Signed)

## 2020-09-15 ENCOUNTER — Other Ambulatory Visit: Payer: Commercial Managed Care - PPO

## 2020-09-15 ENCOUNTER — Telehealth: Payer: Self-pay | Admitting: Hematology and Oncology

## 2020-09-15 NOTE — Telephone Encounter (Signed)
Patient called in to r/s appointments. R/s per patient request, patient aware.

## 2020-09-16 ENCOUNTER — Telehealth: Payer: Commercial Managed Care - PPO | Admitting: Hematology and Oncology

## 2020-09-16 ENCOUNTER — Inpatient Hospital Stay: Payer: Commercial Managed Care - PPO

## 2020-09-20 NOTE — Progress Notes (Addendum)
[facility] at [facility] [address] [place], [place] [address] [phone]  Date:  09/22/2020   Name:  Joe Ray   DOB:  1981-07-11   MRN:  [id number]  PCP:  Pearline Cables, MD    Chief Complaint: Adominal Concern (Nausea, unable to hold down food, started last week. ) and Cough (Chronic cough and weakness)   History of Present Illness:  Joe Ray is a 40 y.o. very pleasant male patient who presents with the following:  Pt here today with concern of symptoms of illness  Last seen by myself in December   Patient who is generally healthy except for prediabetes and neuropathy. Last seen by myself in October of this year He has had concern of pain and numbness in his bilateral lower extremities for over 2 years, we have done a fairly exhaustive work-up for this condition and have unfortunately not come to an explanation  Today he has a "really bad stomach issue" which started about 10 days ago He just started having diarrhea, but no pain. He is having bloating and gas He has not had any vomiting No blood in his stool No fever noted  No recent abx He notes that he has been having diarrhea and that foods seems to go right through him His sx got worse again yesterday No unusual foods or suspicious foods He took some imodium yesterday which did help  He last had a BM last night  His stools are watery   He is drinking liquids and trying to stay hydrated  He also has noted a dry cough for about 2 weeks.  No other URI symptoms at this time  Wt Readings from Last 3 Encounters:  09/22/20 172 lb (78 kg)  08/22/20 175 lb 8 oz (79.6 kg)  08/04/20 172 lb 12.8 oz (78.4 kg)      Patient Active Problem List   Diagnosis Date Noted  . Iron deficiency 08/04/2020  . Prediabetes 11/13/2019  . Peripheral neuropathy 11/13/2019  . G E R D 06/30/2010  . COUGH 06/30/2010    Past Medical History:  Diagnosis Date  . Allergy      No past surgical history on file.  Social History   Tobacco Use  . Smoking status: Never Smoker  . Smokeless tobacco: Never Used  Vaping Use  . Vaping Use: Never used  Substance Use Topics  . Alcohol use: Never  . Drug use: Never    Family History  Problem Relation Age of Onset  . Diabetes Father     Allergies  Allergen Reactions  . Amoxicillin Diarrhea and Rash    Medication list has been reviewed and updated.  Current Outpatient Medications on File Prior to Visit  Medication Sig Dispense Refill  . Cholecalciferol (VITAMIN D-3) 1000 units CAPS Take by mouth.    . DULoxetine (CYMBALTA) 30 MG capsule Take 1 capsule (30 mg total) by mouth 2 (two) times daily. 180 capsule 3  . Methylcobalamin 1000 MCG TBDP Take by mouth.    . Multiple Vitamin (MULTIVITAMIN) tablet Take 1 tablet by mouth daily.    . sildenafil (REVATIO) 20 MG tablet Take 20-40 mg by mouth daily as needed.      No current facility-administered medications on file prior to visit.    Review of Systems:  As per HPI- otherwise negative.   Physical Examination: Vitals:   09/22/20 1528  BP: 112/82  Pulse: 65  Resp: 17  Temp: 98.3 F (36.8 C)  SpO2: 98%   Vitals:   09/22/20 1528  Weight: 172 lb (78 kg)  Height: 5' 8.5" (1.74 m)   Body mass index is 25.77 kg/m. Ideal Body Weight: Weight in (lb) to have BMI = 25: 166.5  GEN: no acute distress.  Looks well HEENT: Atraumatic, Normocephalic.  Ears and Nose: No external deformity. CV: RRR, No M/G/R. No JVD. No thrill. No extra heart sounds. PULM: CTA B, no wheezes, crackles, rhonchi. No retractions. No resp. distress. No accessory muscle use. ABD: S, NT, ND, +BS. No rebound. No HSM.  Benign belly EXTR: No c/c/e PSYCH: Normally interactive. Conversant.    Assessment and Plan: Diarrhea, unspecified type - Plan: Clostridium Difficile by PCR(Lab[facility]/Sunquest), CBC, Comprehensive metabolic panel, Stool Culture  Cough - Plan:  promethazine-dextromethorphan (PROMETHAZINE-DM) 6.25-15 MG/5ML syrup, albuterol (VENTOLIN HFA) 108 (90 Base) MCG/ACT inhaler  Iron deficiency - Plan: Ferritin, CBC  Patient today with persistent diarrhea for about 10 days.  This time we will obtain C. difficile testing and stool culture.  Advised him to continue hydrating, eat a bland diet/mostly carbohydrates, may use Imodium as needed If he develops any pain or worsening he will let me know  For cough prescribed Phenergan DM, albuterol to use as needed.  Avoid antibiotics right now due to active diarrhea  History of iron deficiency which has been treated, check ferritin today  Will plan further follow- up pending labs. I have asked him to keep me closely updated as to his symptoms This visit occurred during the SARS-CoV-2 public health emergency.  Safety protocols were in place, including screening questions prior to the visit, additional usage of staff PPE, and extensive cleaning of exam room while observing appropriate contact time as indicated for disinfecting solutions.    Signed Abbe Amsterdam, MD  addnd 3/1- received labs as follows, message to pt  Results for orders placed or performed in visit on 09/22/20  Ferritin  Result Value Ref Range   Ferritin 409.4 (H) 22.0 - 322.0 ng/mL  CBC  Result Value Ref Range   WBC 6.0 4.0 - 10.5 K/uL   RBC 5.31 4.22 - 5.81 Mil/uL   Platelets 230.0 150.0 - 400.0 K/uL   Hemoglobin 15.4 13.0 - 17.0 g/dL   HCT 37.0 48.8 - 89.1 %   MCV 85.1 78.0 - 100.0 fl   MCHC 34.0 30.0 - 36.0 g/dL   RDW 69.4 50.3 - 88.8 %  Comprehensive metabolic panel  Result Value Ref Range   Sodium 138 135 - 145 mEq/L   Potassium 4.5 3.5 - 5.1 mEq/L   Chloride 102 96 - 112 mEq/L   CO2 29 19 - 32 mEq/L   Glucose, Bld 74 70 - 99 mg/dL   BUN 11 6 - 23 mg/dL   Creatinine, Ser 2.80 0.40 - 1.50 mg/dL   Total Bilirubin 0.4 0.2 - 1.2 mg/dL   Alkaline Phosphatase 85 39 - 117 U/L   AST 37 0 - 37 U/L   ALT 68 (H) 0 - 53  U/L   Total Protein 6.8 6.0 - 8.3 g/dL   Albumin 4.3 3.5 - 5.2 g/dL   GFR 034.91 >79.15 mL/min   Calcium 10.0 8.4 - 10.5 mg/dL

## 2020-09-22 ENCOUNTER — Encounter: Payer: Self-pay | Admitting: Family Medicine

## 2020-09-22 ENCOUNTER — Ambulatory Visit (INDEPENDENT_AMBULATORY_CARE_PROVIDER_SITE_OTHER): Payer: Commercial Managed Care - PPO | Admitting: Family Medicine

## 2020-09-22 ENCOUNTER — Other Ambulatory Visit: Payer: Self-pay

## 2020-09-22 VITALS — BP 112/82 | HR 65 | Temp 98.3°F | Resp 17 | Ht 68.5 in | Wt 172.0 lb

## 2020-09-22 DIAGNOSIS — R059 Cough, unspecified: Secondary | ICD-10-CM

## 2020-09-22 DIAGNOSIS — E611 Iron deficiency: Secondary | ICD-10-CM | POA: Diagnosis not present

## 2020-09-22 DIAGNOSIS — R197 Diarrhea, unspecified: Secondary | ICD-10-CM

## 2020-09-22 MED ORDER — PROMETHAZINE-DM 6.25-15 MG/5ML PO SYRP
5.0000 mL | ORAL_SOLUTION | Freq: Four times a day (QID) | ORAL | 0 refills | Status: DC | PRN
Start: 2020-09-22 — End: 2021-03-11

## 2020-09-22 MED ORDER — ALBUTEROL SULFATE HFA 108 (90 BASE) MCG/ACT IN AERS
2.0000 | INHALATION_SPRAY | Freq: Four times a day (QID) | RESPIRATORY_TRACT | 0 refills | Status: DC | PRN
Start: 2020-09-22 — End: 2021-03-11

## 2020-09-22 NOTE — Patient Instructions (Signed)
Good to see you today- I am sorry you are not feeling well!  Use imodium as needed according to package direction.  Please do try and eat something- bland foods, carbs are your best bet If you need something stronger we can try rx lomotil if needed We will get stool studies and make sure you don't have c diff or another bacterial diarrhea For cough use the phenergan syrup as needed- will make you sleepy!  Albuterol inhaler as needed for spasm feeling in your breathing tubes/ wheezing

## 2020-09-23 ENCOUNTER — Encounter: Payer: Self-pay | Admitting: Family Medicine

## 2020-09-23 ENCOUNTER — Other Ambulatory Visit: Payer: Commercial Managed Care - PPO

## 2020-09-23 DIAGNOSIS — R197 Diarrhea, unspecified: Secondary | ICD-10-CM

## 2020-09-23 LAB — COMPREHENSIVE METABOLIC PANEL
ALT: 68 U/L — ABNORMAL HIGH (ref 0–53)
AST: 37 U/L (ref 0–37)
Albumin: 4.3 g/dL (ref 3.5–5.2)
Alkaline Phosphatase: 85 U/L (ref 39–117)
BUN: 11 mg/dL (ref 6–23)
CO2: 29 mEq/L (ref 19–32)
Calcium: 10 mg/dL (ref 8.4–10.5)
Chloride: 102 mEq/L (ref 96–112)
Creatinine, Ser: 0.84 mg/dL (ref 0.40–1.50)
GFR: 109.85 mL/min (ref >60.00)
Glucose, Bld: 74 mg/dL (ref 70–99)
Potassium: 4.5 mEq/L (ref 3.5–5.1)
Sodium: 138 mEq/L (ref 135–145)
Total Bilirubin: 0.4 mg/dL (ref 0.2–1.2)
Total Protein: 6.8 g/dL (ref 6.0–8.3)

## 2020-09-23 LAB — CBC
HCT: 45.2 % (ref 39.0–52.0)
Hemoglobin: 15.4 g/dL (ref 13.0–17.0)
MCHC: 34 g/dL (ref 30.0–36.0)
MCV: 85.1 fl (ref 78.0–100.0)
Platelets: 230 10*3/uL (ref 150.0–400.0)
RBC: 5.31 Mil/uL (ref 4.22–5.81)
RDW: 14 % (ref 11.5–15.5)
WBC: 6 10*3/uL (ref 4.0–10.5)

## 2020-09-23 LAB — FERRITIN: Ferritin: 409.4 ng/mL — ABNORMAL HIGH (ref 22.0–322.0)

## 2020-09-23 NOTE — Assessment & Plan Note (Deleted)
04/28/2020: Hemoglobin 14.6, MCV 83.2, RDW 13.1, platelets 298, WBC 7.1, normal CMP, ferritin 37 (33 about 1 year ago, lowest ferritin was 16), folic acid greater than 24, B12 586, previously celiac antibodies were negative  Severe Neuropathy: which did not improve with B 12 replacement therapy. IV Iron: Jan 2022

## 2020-09-23 NOTE — Addendum Note (Signed)
Addended by: Mervin Kung A on: 09/23/2020 10:32 AM   Modules accepted: Orders

## 2020-09-23 NOTE — Progress Notes (Incomplete)
  HEMATOLOGY-ONCOLOGY MYCHART VIDEO VISIT PROGRESS NOTE  I connected with Joe Ray on 09/23/2020 at  2:15 PM EST by MyChart video conference and verified that I am speaking with the correct person using two identifiers.  I discussed the limitations, risks, security and privacy concerns of performing an evaluation and management service by MyChart and the availability of in person appointments.  I also discussed with the patient that there may be a patient responsible charge related to this service. The patient expressed understanding and agreed to proceed.  Patient's Location: Home Physician Location: Clinic  CHIEF COMPLIANT: Follow-up of iron deficiency to review labs   INTERVAL HISTORY: Joe Ray is a 40 y.o. male with above-mentioned history of iron deficiency. Labs on 04/28/20 showed Hg 15.4, HCT 45.2, ferritin 409. He presents over MyChart today for follow-up.    Observations/Objective:  There were no vitals filed for this visit. There is no height or weight on file to calculate BMI.  I have reviewed the data as listed CMP Latest Ref Rng & Units 09/22/2020 04/28/2020 11/14/2019  Glucose 70 - 99 mg/dL 74 83 87  BUN 6 - 23 mg/dL 11 19 17   Creatinine 0.40 - 1.50 mg/dL 3.01 6.01  Sodium 135 - 145 mEq/L 138 139 138  Potassium 3.5 - 5.1 mEq/L 4.5 4.5 4.1  Chloride 96 - 112 mEq/L 102 103 103  CO2 19 - 32 mEq/L 29 27 29   Calcium 8.4 - 10.5 mg/dL 0.93 9.8 9.2  Total Protein 6.0 - 8.3 g/dL 6.8 6.8 -  Total Bilirubin 0.2 - 1.2 mg/dL 0.4 0.3 -  Alkaline Phos 39 - 117 U/L 85 - -  AST 0 - 37 U/L 37 22 -  ALT 0 - 53 U/L 68(H) 27 -    Lab Results  Component Value Date   WBC 6.0 09/22/2020   HGB 15.4 09/22/2020   HCT 45.2 09/22/2020   MCV 85.1 09/22/2020   PLT 230.0 09/22/2020      Assessment Plan:  No problem-specific Assessment & Plan notes found for this encounter.    I discussed the assessment and treatment plan with the patient. The patient was provided an opportunity to  ask questions and all were answered. The patient agreed with the plan and demonstrated an understanding of the instructions. The patient was advised to call back or seek an in-person evaluation if the symptoms worsen or if the condition fails to improve as anticipated.   Total time spent: *** minutes including face-to-face MyChart video visit time and time spent for planning, charting and coordination of care  09/24/2020, MD 09/23/2020   I, Sabas Sous Dorshimer, am acting as scribe for 11/23/2020, MD.  {insert scribe attestation}

## 2020-09-24 ENCOUNTER — Inpatient Hospital Stay: Payer: Commercial Managed Care - PPO | Admitting: Hematology and Oncology

## 2020-09-24 DIAGNOSIS — E611 Iron deficiency: Secondary | ICD-10-CM

## 2020-09-25 ENCOUNTER — Encounter: Payer: Self-pay | Admitting: Family Medicine

## 2020-09-25 LAB — CLOSTRIDIUM DIFFICILE BY PCR: Toxigenic C. Difficile by PCR: NEGATIVE

## 2020-09-28 ENCOUNTER — Encounter: Payer: Self-pay | Admitting: Family Medicine

## 2020-09-28 DIAGNOSIS — M79671 Pain in right foot: Secondary | ICD-10-CM

## 2020-09-28 DIAGNOSIS — G6289 Other specified polyneuropathies: Secondary | ICD-10-CM

## 2020-09-28 DIAGNOSIS — M79672 Pain in left foot: Secondary | ICD-10-CM

## 2020-09-28 LAB — STOOL CULTURE: E coli, Shiga toxin Assay: NEGATIVE

## 2020-10-07 NOTE — Addendum Note (Signed)
Addended by: Abbe Amsterdam C on: 10/07/2020 01:42 PM   Modules accepted: Orders

## 2020-10-12 NOTE — Progress Notes (Incomplete)
  HEMATOLOGY-ONCOLOGY [facility] VIDEO VISIT PROGRESS NOTE  I connected with Joe Ray on 10/12/2020 at  2:30 PM EDT by MyChart video conference and verified that I am speaking with the correct person using two identifiers.  I discussed the limitations, risks, security and privacy concerns of performing an evaluation and management service by MyChart and the availability of in person appointments.  I also discussed with the patient that there may be a patient responsible charge related to this service. The patient expressed understanding and agreed to proceed.  Patient's Location: Home Physician Location: Clinic  CHIEF COMPLIANT: Follow-up of iron deficiency  INTERVAL HISTORY: Joe Ray is a 40 y.o. male with above-mentioned history of iron deficiency. He received three doses of Venofer on 08/14/20, 08/18/20, and 08/22/20. He presents to the clinic today for follow-up.   Observations/Objective:  There were no vitals filed for this visit. There is no height or weight on file to calculate BMI.  I have reviewed the data as listed CMP Latest Ref Rng & Units 09/22/2020 04/28/2020 11/14/2019  Glucose 70 - 99 mg/dL 74 83 87  BUN 6 - 23 mg/dL 11 19 17   Creatinine 0.40 - 1.50 mg/dL 0.76 2.26  Sodium 135 - 145 mEq/L 138 139 138  Potassium 3.5 - 5.1 mEq/L 4.5 4.5 4.1  Chloride 96 - 112 mEq/L 102 103 103  CO2 19 - 32 mEq/L 29 27 29   Calcium 8.4 - 10.5 mg/dL 3.33 9.8 9.2  Total Protein 6.0 - 8.3 g/dL 6.8 6.8 -  Total Bilirubin 0.2 - 1.2 mg/dL 0.4 0.3 -  Alkaline Phos 39 - 117 U/L 85 - -  AST 0 - 37 U/L 37 22 -  ALT 0 - 53 U/L 68(H) 27 -    Lab Results  Component Value Date   WBC 6.0 09/22/2020   HGB 15.4 09/22/2020   HCT 45.2 09/22/2020   MCV 85.1 09/22/2020   PLT 230.0 09/22/2020      Assessment Plan:  No problem-specific Assessment & Plan notes found for this encounter.    I discussed the assessment and treatment plan with the patient. The patient was provided an opportunity to  ask questions and all were answered. The patient agreed with the plan and demonstrated an understanding of the instructions. The patient was advised to call back or seek an in-person evaluation if the symptoms worsen or if the condition fails to improve as anticipated.   Total time spent: *** minutes including face-to-face MyChart video visit time and time spent for planning, charting and coordination of care  09/24/2020, MD 10/12/2020   I, Sabas Sous Dorshimer, am acting as scribe for 10/14/2020, MD.  {insert scribe attestation}

## 2020-10-13 ENCOUNTER — Telehealth: Payer: Self-pay | Admitting: Hematology and Oncology

## 2020-10-13 ENCOUNTER — Inpatient Hospital Stay: Payer: Commercial Managed Care - PPO | Admitting: Hematology and Oncology

## 2020-10-13 NOTE — Telephone Encounter (Signed)
Cancelled todays appt per patient request. He did not want to reschedule at this time

## 2020-10-13 NOTE — Assessment & Plan Note (Deleted)
04/28/2020: Hemoglobin 14.6, MCV 83.2, RDW 13.1, platelets 298, WBC 7.1, normal CMP, ferritin 37 (33 about 1 year ago, lowest ferritin was 16), folic acid greater than 24, B12 586, previously celiac antibodies were negative  Mild iron deficiency without anemia: Given his extraordinary amount of neuropathy without any clear-cut etiology and lack of response to B12 replacement therapy, we gave her intravenous iron therapy.  IV Iron: Jan 2022  Peripheral neuropathy: Unclear etiology, patient sees Dr. Marjory Lies as well as another neurologist at [facility]. Patient works at [employer] and stays extremely busy.

## 2020-10-20 ENCOUNTER — Ambulatory Visit: Payer: Commercial Managed Care - PPO | Admitting: Physical Therapy

## 2021-03-09 ENCOUNTER — Telehealth: Payer: Self-pay | Admitting: Family Medicine

## 2021-03-09 NOTE — Telephone Encounter (Signed)
Pt called just now at 3:52pm to let us now he needed an urgent message sent back to you because he needs an appointment to get an MRI for the numbness in his feet, hands, and back that is continuously worsening. He says he was advised this AM that you have no openings this week. He says this is very urgent because PT has recommended this.   (Pt called this morning and spoke to front staff about scheduling the appointment, provided none of the info above.)

## 2021-03-09 NOTE — Telephone Encounter (Signed)
Called him back- he was having some numbness last year and he saw neurology and had MRI of his cervical and lumbar spines His sx are gradually getting worse.  No acute sudden changes   Plan to see him Wednesday at 3:40

## 2021-03-10 NOTE — Progress Notes (Signed)
[facility] at [facility] [address] [place], [place] [address] [phone]  Date:  03/11/2021   Name:  Joe Ray   DOB:  31-May-1981   MRN:  [id number]  PCP:  Pearline Cables, MD    Chief Complaint: Numbness (Bilateral feet numbness, radiating to lower back and hip)   History of Present Illness:  Joe Ray is a 40 y.o. very pleasant male patient who presents with the following:  Patient seen today for follow-up-he contacted me recently with concern of continued numbness which we have looked at previously; scheduled visit for today He has noted pain and numbness in bilateral lower extremities for over 2 years, we have done an extensive work-up, unfortunately inconclusive We did think iron deficiency could be playing a role-I do not think he is currently receiving iron infusion  He had an MRI of his cervical spine and lumbar spine in January 2021, MRI of his brain December 2019-all of these were normal, done per [facility] neurology  He also got a second opinion per [facility] neurology in December 2021:  ASSESSMENT: 1. Restless extremity syndrome. 2. Chronic low ferritin despite oral supplementation, likely representing poor oral iron absorption. 3. Mild hypertension.  PLAN: We discussed treatment options. Increase Cymbalta to 90 mg daily until he uses up of his current supply, then we will increase to 120 mg daily, taking 2 of the 60 mg capsules. He will let us know if this does not seem to be effective over the next 3-4 weeks and we can consider going through a punch nerve biopsy to look for evidence of a small-fiber neuropathy. Whether this would actually disclose the cause is much less certain. I also believe it is worthwhile to try an IV infusion of iron to see if this can help as he apparently does not seem to absorb oral iron very well. We had tried clonazepam or another or benzodiazepines, but these certainly could  be more side effect ridden. I also consider dopaminergic threapy, but must be concerned about augmentation. 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 All of his other lab work has been normal including zinc, Sjogren's, copper studies, celiac antibodies, Lyme antibodies. B12 is currently over 1300. Sedimentation rate at highest as in 16. He has a normal A1c of 5.8% and has had negative rheumatoid factor and CCP. ANA is negative. ACE is normal. ANCA is negative.  He will let us know how the higher dose of Cymbalta is working and we will try to work out the details giving IV infusions of iron either through [facility] in [place] or possibly as an outpatient over in [place]. Continue vitamin supplementation. Encourage weight loss and exercise.   Gabe has been doing PT-about once a week.  He tried acupuncture; helps just temporarily They also tried some chiro adjustments which were not helpful He went to some sort of naturopath and did some more labs, etc- they suggested a couple of supplements but did not find any obvious cause of his symptoms The pain in his feet will wax and wane- is not present every single day now which is good His hands are affected more however He notes that his hands and forearms are weak, and he may have numbness, a cold feeling in his arms  He notes that his face also seems to sometimes feel numb  He has noted more lower back pain for 6-8 months- he has pain down  down upper legs/ buttocks  We discussed trying prednisone for this symptom, but he notes it has really upset his stomach previously  He stopped using gabapentin and also cymbalta- they did not seem to be helping him   He notes that he will sometimes also have pelvic pain, feels like he needs to urinate but he does not actually have to go His urine stream will be ok  This occurs perhaps once a week He does not feel like he is retaining urine  He did do an iron infusion but it really did not seem to  help him   I offered pain medication, at this time he is doing okay with Advil and heat  Repeat nerve studies in legs   Patient Active Problem List   Diagnosis Date Noted   Iron deficiency 08/04/2020   Prediabetes 11/13/2019   Peripheral neuropathy 11/13/2019   G E R D 06/30/2010   COUGH 06/30/2010    Past Medical History:  Diagnosis Date   Allergy     No past surgical history on file.  Social History   Tobacco Use   Smoking status: Never   Smokeless tobacco: Never  Vaping Use   Vaping Use: Never used  Substance Use Topics   Alcohol use: Never   Drug use: Never    Family History  Problem Relation Age of Onset   Diabetes Father     Allergies  Allergen Reactions   Amoxicillin Diarrhea and Rash    Medication list has been reviewed and updated.  Current Outpatient Medications on File Prior to Visit  Medication Sig Dispense Refill   Cholecalciferol (VITAMIN D-3) 1000 units CAPS Take by mouth.     Methylcobalamin 1000 MCG TBDP Take by mouth.     Multiple Vitamin (MULTIVITAMIN) tablet Take 1 tablet by mouth daily.     sildenafil (REVATIO) 20 MG tablet Take 20-40 mg by mouth daily as needed.  (Patient not taking: Reported on 03/11/2021)     No current facility-administered medications on file prior to visit.    Review of Systems:  As per HPI- otherwise negative.   Physical Examination: Vitals:   03/11/21 1544  BP: 116/74  Pulse: 95  Resp: 16  Temp: (!) 97.5 F (36.4 C)  SpO2: 97%   Vitals:   03/11/21 1544  Weight: 165 lb (74.8 kg)  Height: 5' 8.5" (1.74 m)   Body mass index is 24.72 kg/m. Ideal Body Weight: Weight in (lb) to have BMI = 25: 166.5  GEN: no acute distress.  Looks well, normal weight HEENT: Atraumatic, Normocephalic.  Ears and Nose: No external deformity. CV: RRR, No M/G/R. No JVD. No thrill. No extra heart sounds. PULM: CTA B, no wheezes, crackles, rhonchi. No retractions. No resp. distress. No accessory muscle use. ABD: S, NT,  ND, +BS. No rebound. No HSM. EXTR: No c/c/e PSYCH: Normally interactive. Conversant.  Patient is able to rise onto toes but this is weak, if I pressed on his shoulders he has difficulty There is some apparent wasting of the musculature of his hands and forearms Grip strength, upper extremity strength is slightly decreased, 4+ out of 5 Normal lower extremity strength   Assessment and Plan: Feeling of incomplete bladder emptying - Plan: US Pelvis Limited  Transaminitis - Plan: Hepatic function panel  Muscle weakness  Other polyneuropathy Patient seen today with concern of continued neuropathy symptoms.  He also seems to have developed some muscle weakness/ ?decrease in UE muscle bulk and is concerned about  possible bladder symptoms. We will have imaging check a PVR ultrasound his bladder to make sure he is not retaining urine/neurogenic bladder I will call and discussed the situation [facility]. Seth would like to have " another MRI" if appropriate but we need guidance on where to start.  Also suggested to Ramondo that a [facility] clinic consult could be helpful for him  Addnd 8/19- I was as able to speak with Dr Leanna Battles at [facility].  He agrees that a repeat MRI so soon after his normal MRI series is unlikely to be helpful.  Repeat nerve conduction testing a skin bx for small nerve fibers may be helpful.  I will touch base with pt and ask him to follow-up with Dr Leanna Battles  This visit occurred during the SARS-CoV-2 public health emergency.  Safety protocols were in place, including screening questions prior to the visit, additional usage of staff PPE, and extensive cleaning of exam room while observing appropriate contact time as indicated for disinfecting solutions.   Signed Abbe Amsterdam, MD

## 2021-03-11 ENCOUNTER — Ambulatory Visit (INDEPENDENT_AMBULATORY_CARE_PROVIDER_SITE_OTHER): Payer: Commercial Managed Care - PPO | Admitting: Family Medicine

## 2021-03-11 ENCOUNTER — Ambulatory Visit (HOSPITAL_BASED_OUTPATIENT_CLINIC_OR_DEPARTMENT_OTHER)
Admission: RE | Admit: 2021-03-11 | Discharge: 2021-03-11 | Disposition: A | Discharge status: Home / Self Care | Payer: Commercial Managed Care - PPO | Source: Ambulatory Visit | Attending: Family Medicine | Admitting: Family Medicine

## 2021-03-11 ENCOUNTER — Other Ambulatory Visit: Payer: Self-pay

## 2021-03-11 VITALS — BP 116/74 | HR 95 | Temp 97.5°F | Resp 16 | Ht 68.5 in | Wt 165.0 lb

## 2021-03-11 DIAGNOSIS — R3914 Feeling of incomplete bladder emptying: Secondary | ICD-10-CM | POA: Diagnosis present

## 2021-03-11 DIAGNOSIS — G6289 Other specified polyneuropathies: Secondary | ICD-10-CM

## 2021-03-11 DIAGNOSIS — M6281 Muscle weakness (generalized): Secondary | ICD-10-CM | POA: Diagnosis not present

## 2021-03-11 DIAGNOSIS — R7401 Elevation of levels of liver transaminase levels: Secondary | ICD-10-CM | POA: Diagnosis not present

## 2021-03-11 NOTE — Patient Instructions (Signed)
Good to see you today- please drop by imaging on the ground floor to see if they can do your bladder ultrasound (post- void residual) to see how much urine is left after you urinate We will check your liver function tests I will touch base with Dr Leanna Battles Let's start thinking about having you seen at [facility]

## 2021-03-12 ENCOUNTER — Ambulatory Visit (HOSPITAL_BASED_OUTPATIENT_CLINIC_OR_DEPARTMENT_OTHER): Payer: Commercial Managed Care - PPO

## 2021-03-12 LAB — HEPATIC FUNCTION PANEL
ALT: 38 U/L (ref 0–53)
AST: 24 U/L (ref 0–37)
Albumin: 4.3 g/dL (ref 3.5–5.2)
Alkaline Phosphatase: 80 U/L (ref 39–117)
Bilirubin, Direct: 0.1 mg/dL (ref 0.0–0.3)
Total Bilirubin: 0.4 mg/dL (ref 0.2–1.2)
Total Protein: 6.8 g/dL (ref 6.0–8.3)

## 2021-03-13 ENCOUNTER — Encounter: Payer: Self-pay | Admitting: Family Medicine

## 2021-06-19 NOTE — Progress Notes (Addendum)
[facility] [address], [address] [place], [place] [address] [phone]  Date:  06/24/2021   Name:  Joe Ray   DOB:  15-Jul-1981   MRN:  [id number]  PCP:  Pearline Cables, MD    Chief Complaint: Follow-up (Recheck Vitamin B12 and Iron levels./Concerns/ questions: fatigue, numbness in extremities /Flu shot: received already/)   History of Present Illness:  Joe Ray is a 40 y.o. very pleasant male patient who presents with the following:  Pt with history of peripheral neuropathy which has been challenging to treat Last visit with myself in August He has noted pain and numbness in bilateral lower extremities for over 2 years, we have done an extensive work-up, unfortunately inconclusive He has seen neurology locally and with [facility]- thought to be possibly related to iron deficiency:  I also believe it is worthwhile to try an IV infusion of iron to see if this can help as he apparently does not seem to absorb oral iron very well. We had tried clonazepam or another or benzodiazepines, but these certainly could be more side effect ridden. I also consider dopaminergic threapy, but must be concerned about augmentation. 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 All of his other lab work has been normal including zinc, Sjogren's, copper studies, celiac antibodies, Lyme antibodies. B12 is currently over 1300. Sedimentation rate at highest as in 16. He has a normal A1c of 5.8% and has had negative rheumatoid factor and CCP. ANA is negative. ACE is normal. ANCA is negative.  Flu vaccine- done COVID booster- not yet, recommended Can do routine hepatitis C screening- will do today   He actually notes his feet are a bit better- he is doing some PT and this seems to have helped  He still gets the sx in his feet but less often  He did do an iron infusion which helped for a while  Most recent infusion was at least 11 months  ago- he is not quite sure.  We can recheck his iron today He is also doing some pelvic PT for his bladder sx which is helping whit his sensation of incomplete bladder emptying   He does have a "bump" in his lower back which has been noticed during massage and physical therapy.  He is not sure if this is significant.  It is tender when someone presses directly on the bump Patient Active Problem List   Diagnosis Date Noted   Iron deficiency 08/04/2020   Prediabetes 11/13/2019   Peripheral neuropathy 11/13/2019   G E R D 06/30/2010   COUGH 06/30/2010    Past Medical History:  Diagnosis Date   Allergy     No past surgical history on file.  Social History   Tobacco Use   Smoking status: Never   Smokeless tobacco: Never  Vaping Use   Vaping Use: Never used  Substance Use Topics   Alcohol use: Never   Drug use: Never    Family History  Problem Relation Age of Onset   Diabetes Father     Allergies  Allergen Reactions   Amoxicillin Diarrhea and Rash    Medication list has been reviewed and updated.  Current Outpatient Medications on File Prior to Visit  Medication Sig Dispense Refill   Cholecalciferol (VITAMIN D-3) 1000 units CAPS Take by mouth.     Methylcobalamin 1000 MCG TBDP Take by mouth.     Multiple Vitamin (MULTIVITAMIN) tablet  Take 1 tablet by mouth daily.     sildenafil (REVATIO) 20 MG tablet Take 20-40 mg by mouth daily as needed.  (Patient not taking: Reported on 06/24/2021)     No current facility-administered medications on file prior to visit.    Review of Systems:  As per HPI- otherwise negative.   Physical Examination: Vitals:   06/24/21 1619  BP: 110/72  Pulse: 82  Resp: 18  Temp: 97.9 F (36.6 C)   Vitals:   06/24/21 1619  Weight: 165 lb 6.4 oz (75 kg)  Height: 5\' 9"  (1.753 m)   Body mass index is 24.43 kg/m. Ideal Body Weight: Weight in (lb) to have BMI = 25: 168.9  GEN: no acute distress.  Normal weight HEENT: Atraumatic,  Normocephalic.  Ears and Nose: No external deformity. CV: RRR, No M/G/R. No JVD. No thrill. No extra heart sounds. PULM: CTA B, no wheezes, crackles, rhonchi. No retractions. No resp. distress. No accessory muscle use. EXTR: No c/c/e PSYCH: Normally interactive. Conversant.  There is what seems to be a subcutaneous cyst over the musculature of his left lower back.  It is oblong in shape, approximately 1 cm x 2 cm.  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  This is great news.  Labs are pending as above to follow-up on neuropathy and also for routine health maintenance  Discussed apparent subcutaneous cyst in the of his lower back.  On exam this does feel like a definite cyst.  I offered to set up an ultrasound for further evaluation, he declines for the time being  He continues to take over-the-counter oral B12 and vitamin D  Signed 09/24/20, MD Addnd 12/1- received labs as below, message to patient  Results for orders placed or performed in visit on 06/24/21  Vitamin B12  Result Value Ref Range   Vitamin B-12 826 211 - 911 pg/mL  Ferritin  Result Value Ref Range   Ferritin 191.6 22.0 - 322.0 ng/mL  TSH  Result Value Ref Range   TSH 1.37 0.35 - 5.50 uIU/mL  Comprehensive metabolic panel  Result Value Ref Range   Sodium 139 135 - 145 mEq/L   Potassium 4.2 3.5 - 5.1 mEq/L   Chloride 103 96 - 112 mEq/L    CO2 29 19 - 32 mEq/L   Glucose, Bld 86 70 - 99 mg/dL   BUN 13 6 - 23 mg/dL   Creatinine, Ser 06/26/21 0.40 - 1.50 mg/dL   Total Bilirubin 0.4 0.2 - 1.2 mg/dL   Alkaline Phosphatase 70 39 - 117 U/L   AST 23 0 - 37 U/L   ALT 31 0 - 53 U/L   Total Protein 7.0 6.0 - 8.3 g/dL   Albumin 4.6 3.5 - 5.2 g/dL   GFR 1.82 993.71 mL/min   Calcium 9.5 8.4 - 10.5 mg/dL  Lipid panel  Result Value Ref Range  Cholesterol 191 0 - 200 mg/dL   Triglycerides 970.2 (H) 0.0 - 149.0 mg/dL   HDL 63.78 >58.85 mg/dL   VLDL 02.7 0.0 - 74.1 mg/dL   LDL Cholesterol 287 (H) 0 - 99 mg/dL   Total CHOL/HDL Ratio 4    NonHDL 146.44    Lab was not able to add on an A1c or CBC

## 2021-06-24 ENCOUNTER — Other Ambulatory Visit: Payer: Self-pay

## 2021-06-24 ENCOUNTER — Ambulatory Visit (INDEPENDENT_AMBULATORY_CARE_PROVIDER_SITE_OTHER): Payer: Commercial Managed Care - PPO | Admitting: Family Medicine

## 2021-06-24 VITALS — BP 110/72 | HR 82 | Temp 97.9°F | Resp 18 | Ht 69.0 in | Wt 165.4 lb

## 2021-06-24 DIAGNOSIS — R7303 Prediabetes: Secondary | ICD-10-CM

## 2021-06-24 DIAGNOSIS — M79671 Pain in right foot: Secondary | ICD-10-CM

## 2021-06-24 DIAGNOSIS — Z13220 Encounter for screening for lipoid disorders: Secondary | ICD-10-CM

## 2021-06-24 DIAGNOSIS — G6289 Other specified polyneuropathies: Secondary | ICD-10-CM | POA: Diagnosis not present

## 2021-06-24 DIAGNOSIS — Z1159 Encounter for screening for other viral diseases: Secondary | ICD-10-CM | POA: Diagnosis not present

## 2021-06-24 DIAGNOSIS — L729 Follicular cyst of the skin and subcutaneous tissue, unspecified: Secondary | ICD-10-CM

## 2021-06-24 DIAGNOSIS — E611 Iron deficiency: Secondary | ICD-10-CM

## 2021-06-24 DIAGNOSIS — M79672 Pain in left foot: Secondary | ICD-10-CM

## 2021-06-24 DIAGNOSIS — R5383 Other fatigue: Secondary | ICD-10-CM

## 2021-06-24 NOTE — Patient Instructions (Signed)
Good to see you today- I will be in touch with your labs asap 

## 2021-06-25 ENCOUNTER — Encounter: Payer: Self-pay | Admitting: Family Medicine

## 2021-06-25 DIAGNOSIS — R5383 Other fatigue: Secondary | ICD-10-CM

## 2021-06-25 LAB — COMPREHENSIVE METABOLIC PANEL
ALT: 31 U/L (ref 0–53)
AST: 23 U/L (ref 0–37)
Albumin: 4.6 g/dL (ref 3.5–5.2)
Alkaline Phosphatase: 70 U/L (ref 39–117)
BUN: 13 mg/dL (ref 6–23)
CO2: 29 mEq/L (ref 19–32)
Calcium: 9.5 mg/dL (ref 8.4–10.5)
Chloride: 103 mEq/L (ref 96–112)
Creatinine, Ser: 0.84 mg/dL (ref 0.40–1.50)
GFR: 109.27 mL/min (ref >60.00)
Glucose, Bld: 86 mg/dL (ref 70–99)
Potassium: 4.2 mEq/L (ref 3.5–5.1)
Sodium: 139 mEq/L (ref 135–145)
Total Bilirubin: 0.4 mg/dL (ref 0.2–1.2)
Total Protein: 7 g/dL (ref 6.0–8.3)

## 2021-06-25 LAB — HEPATITIS C ANTIBODY
Hepatitis C Ab: NONREACTIVE
SIGNAL TO CUT-OFF: 0.03 (ref <1.00)

## 2021-06-25 LAB — FERRITIN: Ferritin: 191.6 ng/mL (ref 22.0–322.0)

## 2021-06-25 LAB — LIPID PANEL
Cholesterol: 191 mg/dL (ref 0–200)
HDL: 44.1 mg/dL (ref >39.00)
LDL Cholesterol: 114 mg/dL — ABNORMAL HIGH (ref 0–99)
NonHDL: 146.44
Total CHOL/HDL Ratio: 4
Triglycerides: 164 mg/dL — ABNORMAL HIGH (ref 0.0–149.0)
VLDL: 32.8 mg/dL (ref 0.0–40.0)

## 2021-06-25 LAB — TSH: TSH: 1.37 u[IU]/mL (ref 0.35–5.50)

## 2021-06-25 LAB — VITAMIN B12: Vitamin B-12: 826 pg/mL (ref 211–911)

## 2021-07-22 ENCOUNTER — Other Ambulatory Visit (INDEPENDENT_AMBULATORY_CARE_PROVIDER_SITE_OTHER): Payer: Commercial Managed Care - PPO

## 2021-07-22 DIAGNOSIS — R5383 Other fatigue: Secondary | ICD-10-CM | POA: Diagnosis not present

## 2021-07-23 ENCOUNTER — Encounter: Payer: Self-pay | Admitting: Family Medicine

## 2021-07-23 DIAGNOSIS — G6289 Other specified polyneuropathies: Secondary | ICD-10-CM

## 2021-07-23 LAB — TESTOSTERONE TOTAL,FREE,BIO, MALES
Albumin: 4.1 g/dL (ref 3.6–5.1)
Sex Hormone Binding: 33 nmol/L (ref 10–50)
Testosterone, Bioavailable: 155.7 ng/dL (ref 110.0–575.0)
Testosterone, Free: 82.7 pg/mL (ref 46.0–224.0)
Testosterone: 580 ng/dL (ref 250–827)

## 2021-08-11 NOTE — Addendum Note (Signed)
Addended by: Abbe Amsterdam C on: 08/11/2021 03:48 PM   Modules accepted: Orders

## 2021-08-17 ENCOUNTER — Ambulatory Visit: Payer: Commercial Managed Care - PPO

## 2021-08-17 ENCOUNTER — Other Ambulatory Visit: Payer: Self-pay

## 2021-08-17 DIAGNOSIS — G6289 Other specified polyneuropathies: Secondary | ICD-10-CM

## 2021-08-17 NOTE — Therapy (Signed)
[facility] [address] [place], [place], [address] Phone: [phone]   Fax:  [phone]  Physical Therapy Evaluation Arrived - No Charge  Patient Details  Name: Joe Ray MRN: [id number] Date of Birth: 1980/12/03 Referring Provider (PT): Copland, Gwenlyn Found, MD   Encounter Date: 08/17/2021   PT End of Session - 08/17/21 1311     Visit Number 1    Number of Visits 1    Date for PT Re-Evaluation 08/18/21    Authorization Type UHC/UMR 2023 (VL: 60)    PT Start Time 1313    PT Stop Time 1332    PT Time Calculation (min) 19 min    Activity Tolerance Patient tolerated treatment well    Behavior During Therapy [facility] for tasks assessed/performed             Past Medical History:  Diagnosis Date   Allergy     No past surgical history on file.  There were no vitals filed for this visit.    Subjective Assessment - 08/17/21 1312     Subjective Patient reports has had history of neuropathy for approx 3 years. Patient reports feels as he has fatigue/pain with activities that require him to complete for an extended period. No falls to report. Patient reports he has pain in the foot, as well as in the low back. Currently recieving PT services at [facility] for strengthening, as well as Pelvic Floor PT.    Pertinent History No Significant PMH    How long can you walk comfortably? 30    Diagnostic tests Extensive work up completed, but inconclusive. Nerve Conduction results were normal.    Currently in Pain? No/denies                [facility] PT Assessment - 08/17/21 0001       Assessment   Medical Diagnosis Polyneuropathy    Referring Provider (PT) Copland, Gwenlyn Found, MD    Onset Date/Surgical Date 08/11/21   referral date   Prior Therapy Yes Prior PT services for Neuropathy            Objective measurements completed on examination: See above findings.      PT Education - 08/17/21 1313      Education Details Unable to get PT at two locations for same diagnoses; will need to D/C from other PT services and return once D/C    Person(s) Educated Patient    Methods Explanation    Comprehension Verbalized understanding                         Plan - 08/17/21 1333     Clinical Impression Statement Patient is a 41 y.o. male referred to Neuro OPPT services for Polyneuropathy.   No other Significant PMH noted. Patient has had pain/numbness in BLE for >2 years, with extensive work up completed, but inconclusive. Upon speaking with patient, he is recieving Outpatient PT services at another location and at this time has not been d/c. PT educating that will need to d/c from other location to obtain PT services for strengthening/balance at this lcoation, due to treating similar body part. PT Eval will be rescheduled upon d/c.             Patient will benefit from skilled therapeutic intervention in order to improve the following deficits and impairments:     Visit Diagnosis: Other polyneuropathy     Problem List Patient  Active Problem List   Diagnosis Date Noted   Iron deficiency 08/04/2020   Prediabetes 11/13/2019   Peripheral neuropathy 11/13/2019   G E R D 06/30/2010   COUGH 06/30/2010    Tempie Donning, PT, DPT 08/17/2021, 1:35 PM  [facility] [address] [place], [place], [address] Phone: [phone]   Fax:  [phone]  Name: Joe Ray MRN: [id number] Date of Birth: March 10, 1981

## 2021-08-27 ENCOUNTER — Ambulatory Visit: Payer: Commercial Managed Care - PPO

## 2021-09-10 ENCOUNTER — Other Ambulatory Visit: Payer: Self-pay

## 2021-09-10 ENCOUNTER — Ambulatory Visit: Payer: Commercial Managed Care - PPO | Attending: Family Medicine

## 2021-09-10 DIAGNOSIS — M6281 Muscle weakness (generalized): Secondary | ICD-10-CM | POA: Diagnosis present

## 2021-09-10 DIAGNOSIS — R2681 Unsteadiness on feet: Secondary | ICD-10-CM | POA: Diagnosis present

## 2021-09-10 DIAGNOSIS — R2689 Other abnormalities of gait and mobility: Secondary | ICD-10-CM | POA: Diagnosis present

## 2021-09-10 DIAGNOSIS — R209 Unspecified disturbances of skin sensation: Secondary | ICD-10-CM | POA: Insufficient documentation

## 2021-09-10 NOTE — Therapy (Signed)
[facility] [address] [place], [place], [address] Phone: [phone]   Fax:  [phone]  Physical Therapy Evaluation  Patient Details  Name: Joe Ray K Kasson MRN: [id number] Date of Birth: [birth year] Referring Provider (PT): Copland, Gwenlyn FoundJessica C, MD   Encounter Date: 09/10/2021   PT End of Session - 09/10/21 1656     Visit Number 1    Number of Visits 9    Date for PT Re-Evaluation 11/27/21    Authorization Type UHC/UMR 2023 (VL: 60)    PT Start Time 1616    PT Stop Time 1700    PT Time Calculation (min) 44 min    Activity Tolerance Patient tolerated treatment well    Behavior [facility] for tasks assessed/performed             Past Medical History:  Diagnosis Date   Allergy     History reviewed. No pertinent surgical history.  There were no vitals filed for this visit.    Subjective Assessment - 09/10/21 1618     Subjective Patient reports has had history of neuropathy for approx 3 years. Patient reports feels as he has fatigue/pain with activities that require him to complete for an extended period. No falls to report, reports that he can feel unbalanced with turns or reaching for things. Does not feel like he can stand on one foot. Patient fatigues quicker. Reports some days he is unable to do anything after work. Patient reports he has pain in the foot, as well as in the low back. [facility]. No other new changes since initially here. Request to take off the shoe due to feeling at this increases pain.    Pertinent History No Significant PMH    How long can you walk comfortably? 10 minutes    Diagnostic tests Extensive work up completed, but inconclusive. Nerve Conduction results were normal.    Currently in Pain? Yes    Pain Score 7     Pain Location Foot    Pain Orientation Right;Left    Pain Descriptors / Indicators Throbbing    Pain Type Acute pain;Neuropathic  pain    Pain Onset More than a month ago    Pain Frequency Intermittent    Aggravating Factors  wearing shoes; prolonged positioning    Pain Relieving Factors changing position; biofreeze; tylenol    Multiple Pain Sites Yes    Pain Score 3    Pain Location Back    Pain Orientation Lower;Right;Left;Medial    Pain Type Chronic pain    Pain Radiating Towards radiates down into bilateral glute region (L side worse than R side)    Pain Onset More than a month ago    Pain Frequency Constant    Aggravating Factors  prolonged position                [facility] - 09/10/21 0001       Assessment   Medical Diagnosis Polyneuropathy    Referring Provider (PT) Copland, Gwenlyn FoundJessica C, MD    Onset Date/Surgical Date 08/11/21   referral date   Prior Therapy Yes Prior PT services for Neuropathy. Just Finished at [facility]. Also recieving Pelvic PT      Precautions   Precautions Fall      Restrictions   Weight Bearing Restrictions No      Balance Screen   Has the patient fallen in the past 6 months No  Has the patient had a decrease in activity level because of a fear of falling?  No    Is the patient reluctant to leave their home because of a fear of falling?  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 Sits on a ball chair.    Leisure Work in the yard      Cognition   Overall Cognitive Status Within Functional Limits for tasks assessed      Observation/Other Assessments-Edema    Edema --   reports some  swelling on the top of the feet, is fluctuating (worse in the evening)     Sensation   Light Touch Impaired by gross assessment    Additional Comments L Foot more sensitive to touch, dimished on RLE      Coordination   Gross Motor Movements are Fluid and Coordinated No    Coordination and Movement Description mild coordination deficits noted in BLE due to weakness    Heel Shin Test WNL      Posture/Postural Control   Posture/Postural Control No significant limitations      ROM / Strength   AROM / PROM / Strength Strength      Strength   Overall Strength Deficits    Strength Assessment Site Hip;Knee;Ankle    Right/Left Hip Right;Left    Right Hip Flexion 4-/5    Left Hip Flexion 4-/5    Right/Left Knee Right;Left    Right Knee Flexion 4-/5    Right Knee Extension 4-/5    Left Knee Flexion 4-/5    Left Knee Extension 4-/5    Right/Left Ankle Right;Left    Right Ankle Dorsiflexion 3+/5    Left Ankle Dorsiflexion 3+/5      Transfers   Transfers Sit to Stand;Stand to Sit    Sit to Stand 5: Supervision    Five time sit to stand comments  16.16 secs with UE support from standard chair; 5/10 fatigue reported after completion. mild SOB    Stand to Sit 5: Supervision      Ambulation/Gait   Ambulation/Gait Yes    Ambulation/Gait Assistance 5: Supervision    Ambulation/Gait Assistance Details decreased DF noted with fatigue    Ambulation Distance (Feet) 115 Feet    Assistive device None    Gait Pattern Step-through pattern;Decreased dorsiflexion - right;Decreased dorsiflexion - left    Ambulation Surface Level;Indoor    Gait velocity 11.19 secs = 2.93 ft/sec    Stairs Yes    Stairs Assistance 5: Supervision    Stairs Assistance Details (indicate cue type and reason) ascend with reciprocal pattern; rail used due to unsteadiness    Stair Management Technique One rail Right;Alternating pattern;Forwards    Number of Stairs 4    Height of Stairs 6      Balance   Balance Assessed  Yes    Balance comment SLS on RLE: 19.82 seconds; SLS on LLE: 12.23 seconds  Standardized Balance Assessment   Standardized Balance Assessment Timed Up and Go Test      Timed Up and Go Test   TUG Normal TUG    Normal TUG (seconds) 8.28      High Level Balance   High Level Balance Comments Completed M-CTSIB: able to hold situation 1-3 for full 30 seconds; situation 4: 25 seconds. increased postural sway noted on complaint surface.      Functional Gait  Assessment   Gait assessed  Yes    Gait Level Surface Walks 20 ft in less than 7 sec but greater than 5.5 sec, uses assistive device, slower speed, mild gait deviations, or deviates 6-10 in outside of the 12 in walkway width.    Change in Gait Speed Able to smoothly change walking speed without loss of balance or gait deviation. Deviate no more than 6 in outside of the 12 in walkway width.    Gait with Horizontal Head Turns Performs head turns smoothly with slight change in gait velocity (eg, minor disruption to smooth gait path), deviates 6-10 in outside 12 in walkway width, or uses an assistive device.    Gait with Vertical Head Turns Performs head turns with no change in gait. Deviates no more than 6 in outside 12 in walkway width.    Gait and Pivot Turn Pivot turns safely in greater than 3 sec and stops with no loss of balance, or pivot turns safely within 3 sec and stops with mild imbalance, requires small steps to catch balance.    Step Over Obstacle Is able to step over 2 stacked shoe boxes taped together (9 in total height) without changing gait speed. No evidence of imbalance.    Gait with Narrow Base of Support Ambulates less than 4 steps heel to toe or cannot perform without assistance.    Gait with Eyes Closed Walks 20 ft, uses assistive device, slower speed, mild gait deviations, deviates 6-10 in outside 12 in walkway width. Ambulates 20 ft in less than 9 sec but greater than 7 sec.    Ambulating Backwards Walks 20 ft, slow  speed, abnormal gait pattern, evidence for imbalance, deviates 10-15 in outside 12 in walkway width.    Steps Alternating feet, must use rail.    Total Score 20    FGA comment: 20/30                        Objective measurements completed on examination: See above findings.                PT Education - 09/10/21 1619     Education Details Educated on [employer]) Educated Patient    Methods Explanation    Comprehension Verbalized understanding              PT Short Term Goals - 09/10/21 1815       PT SHORT TERM GOAL #1   Title Pt to be independent with initial HEP for balance/strength    Baseline no HEP established    Time 4    Period Weeks    Status New    Target Date 10/30/21   pushed out due to scheduling     PT SHORT TERM GOAL #2   Title TBA and LTG to be set as applicable    Baseline TBA    Time 4    Period Weeks    Status New  PT SHORT TERM GOAL #3   Title Pt will improve situation 4 of  M-CTSIB to >/= 30 seconds    Baseline 25 seconds    Time 4    Period Weeks    Status New               PT Long Term Goals - 09/10/21 1817       PT LONG TERM GOAL #1   Title Pt to be independent with final and progressive HEP for strength/balance    Baseline no HEP established    Time 8    Period Weeks    Status New    Target Date 11/27/21      PT LONG TERM GOAL #2   Title Pt will improve FGA to >/= 24/30 to demo improved balance and reduced fall risk    Baseline 20/30    Time 8    Period Weeks    Status New      PT LONG TERM GOAL #3   Title LTG to be set for 6MWT as applicable    Baseline TBA    Time 8    Period Weeks    Status New      PT LONG TERM GOAL #4   Title Pt will improve SLS on LLE to >/= 15 seconds    Baseline SLS on RLE: 19.82 seconds; SLS on LLE: 12.23 seconds    Time 8    Period Weeks    Status New      PT LONG TERM GOAL #5   Title Pt will improve 5x sit <> stand to </= 12  seconds to demo improved balance    Baseline 16.16 seconds    Time 8    Period Weeks    Status New                    Plan - 09/10/21 1810     Clinical Impression Statement Patient is a 41 y.o. male referred to Neuro OPPT services for Polyneuropathy.   No other Significant PMH noted. Patient has had pain/numbness in BLE for >2 years, with extensive work up completed, but inconclusive. Patient presents with the following impairments upon evaluation: impaired sensation, decreased strength, impaired balance, abnormal gait, decreased endurance/activiy tolerance, pain in bilateral lower extremity, and increased fall risk. Patient scored 20/30 on FGA and 16.16 seconds with 5x sit <> stand indicating elevated fall risk. Pt is currently ambulating at 2.93 ft/sec without use of AD. Pt will benefit from skilled PT services to address impairments and maximize functional mobility    Personal Factors and Comorbidities Time since onset of injury/illness/exacerbation    Examination-Activity Limitations Stand;Locomotion Level;Stairs    Examination-Participation Restrictions Occupation;Community Activity;Yard Work    Stability/Clinical Decision Making Stable/Uncomplicated    OptometristClinical Decision Making Low    Rehab Potential Good    PT Frequency 1x / week    PT Duration 8 weeks    PT Treatment/Interventions ADLs/Self Care Home Management;Aquatic Therapy;Electrical Stimulation;Canalith Repostioning;Moist Heat;Cryotherapy;DME Instruction;Stair training;Functional mobility training;Therapeutic activities;Neuromuscular re-education;Balance training;Therapeutic exercise;Patient/family education;Orthotic Fit/Training;Passive range of motion;Taping;Joint Manipulations;Manual techniques;Gait training    PT Next Visit Plan Assess 6MWT and . Update Goals. Potential need for foot up brace for longer distance ambulation due to fatigue. Begin HEP focused on BLE strengthening and Balance    Consulted and Agree with  Plan of Care Patient             Patient will benefit from skilled therapeutic intervention in order  to improve the following deficits and impairments:  Abnormal gait, Decreased activity tolerance, Decreased balance, Increased edema, Pain, Impaired sensation, Decreased strength, Decreased endurance, Difficulty walking  Visit Diagnosis: Unspecified disturbances of skin sensation  Unsteadiness on feet  Other abnormalities of gait and mobility  Muscle weakness (generalized)     Problem List Patient Active Problem List   Diagnosis Date Noted   Iron deficiency 08/04/2020   Prediabetes 11/13/2019   Peripheral neuropathy 11/13/2019   G E R D 06/30/2010   COUGH 06/30/2010    Tempie Donning, PT, DPT 09/10/2021, 6:21 PM  [facility] [address] [place], [place], [address] Phone: [phone]   Fax:  [phone]  Name: JENKINS RISDON MRN: [id number] Date of Birth: [birth year]

## 2021-10-03 IMAGING — CR DG CHEST 2V
2 series · 2 of 2 positions shown · non-contrast
Comparison: November 29, 2011.

CLINICAL DATA: Shortness of breath.

EXAM:
CHEST - 2 VIEW

[w chest pa]
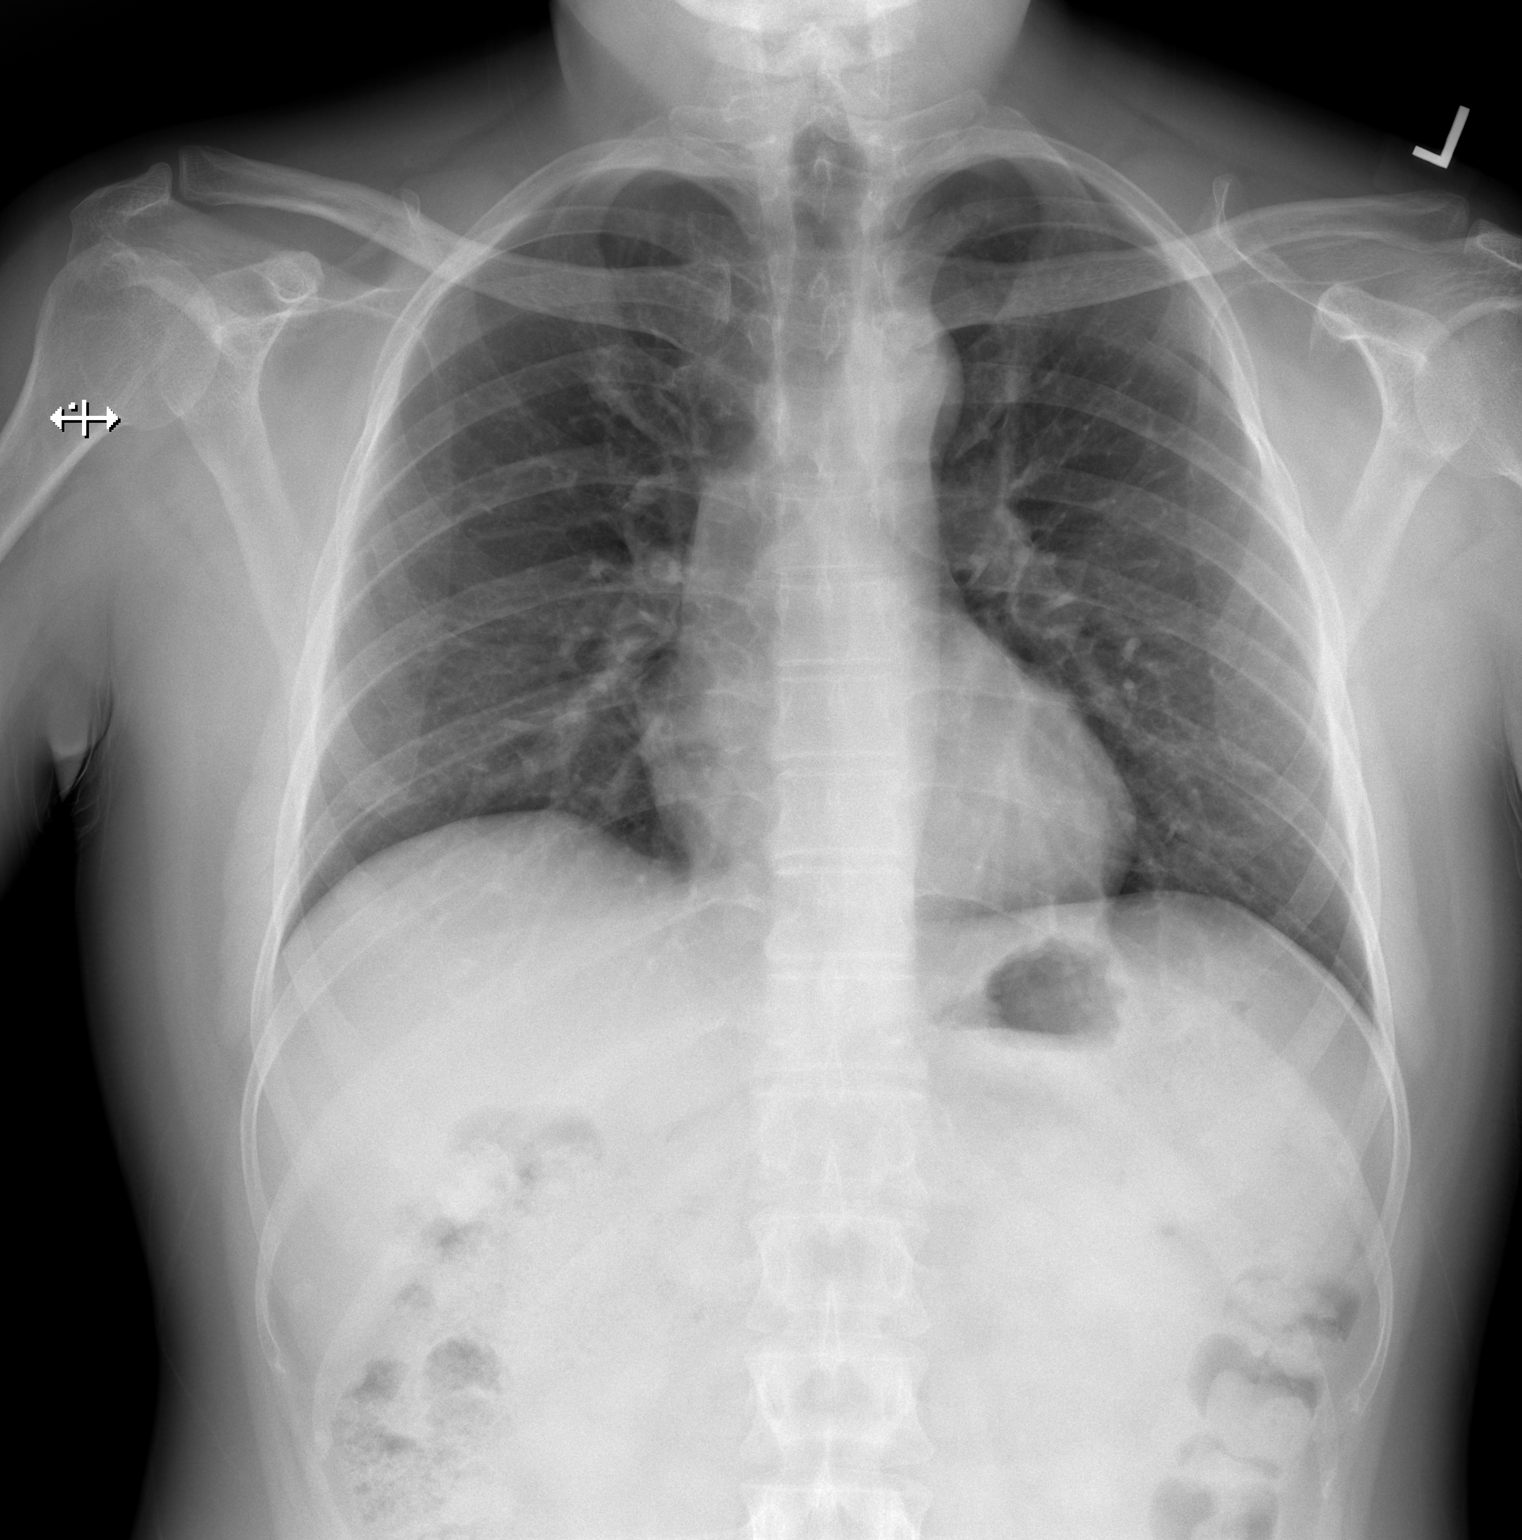

[w chest lat]
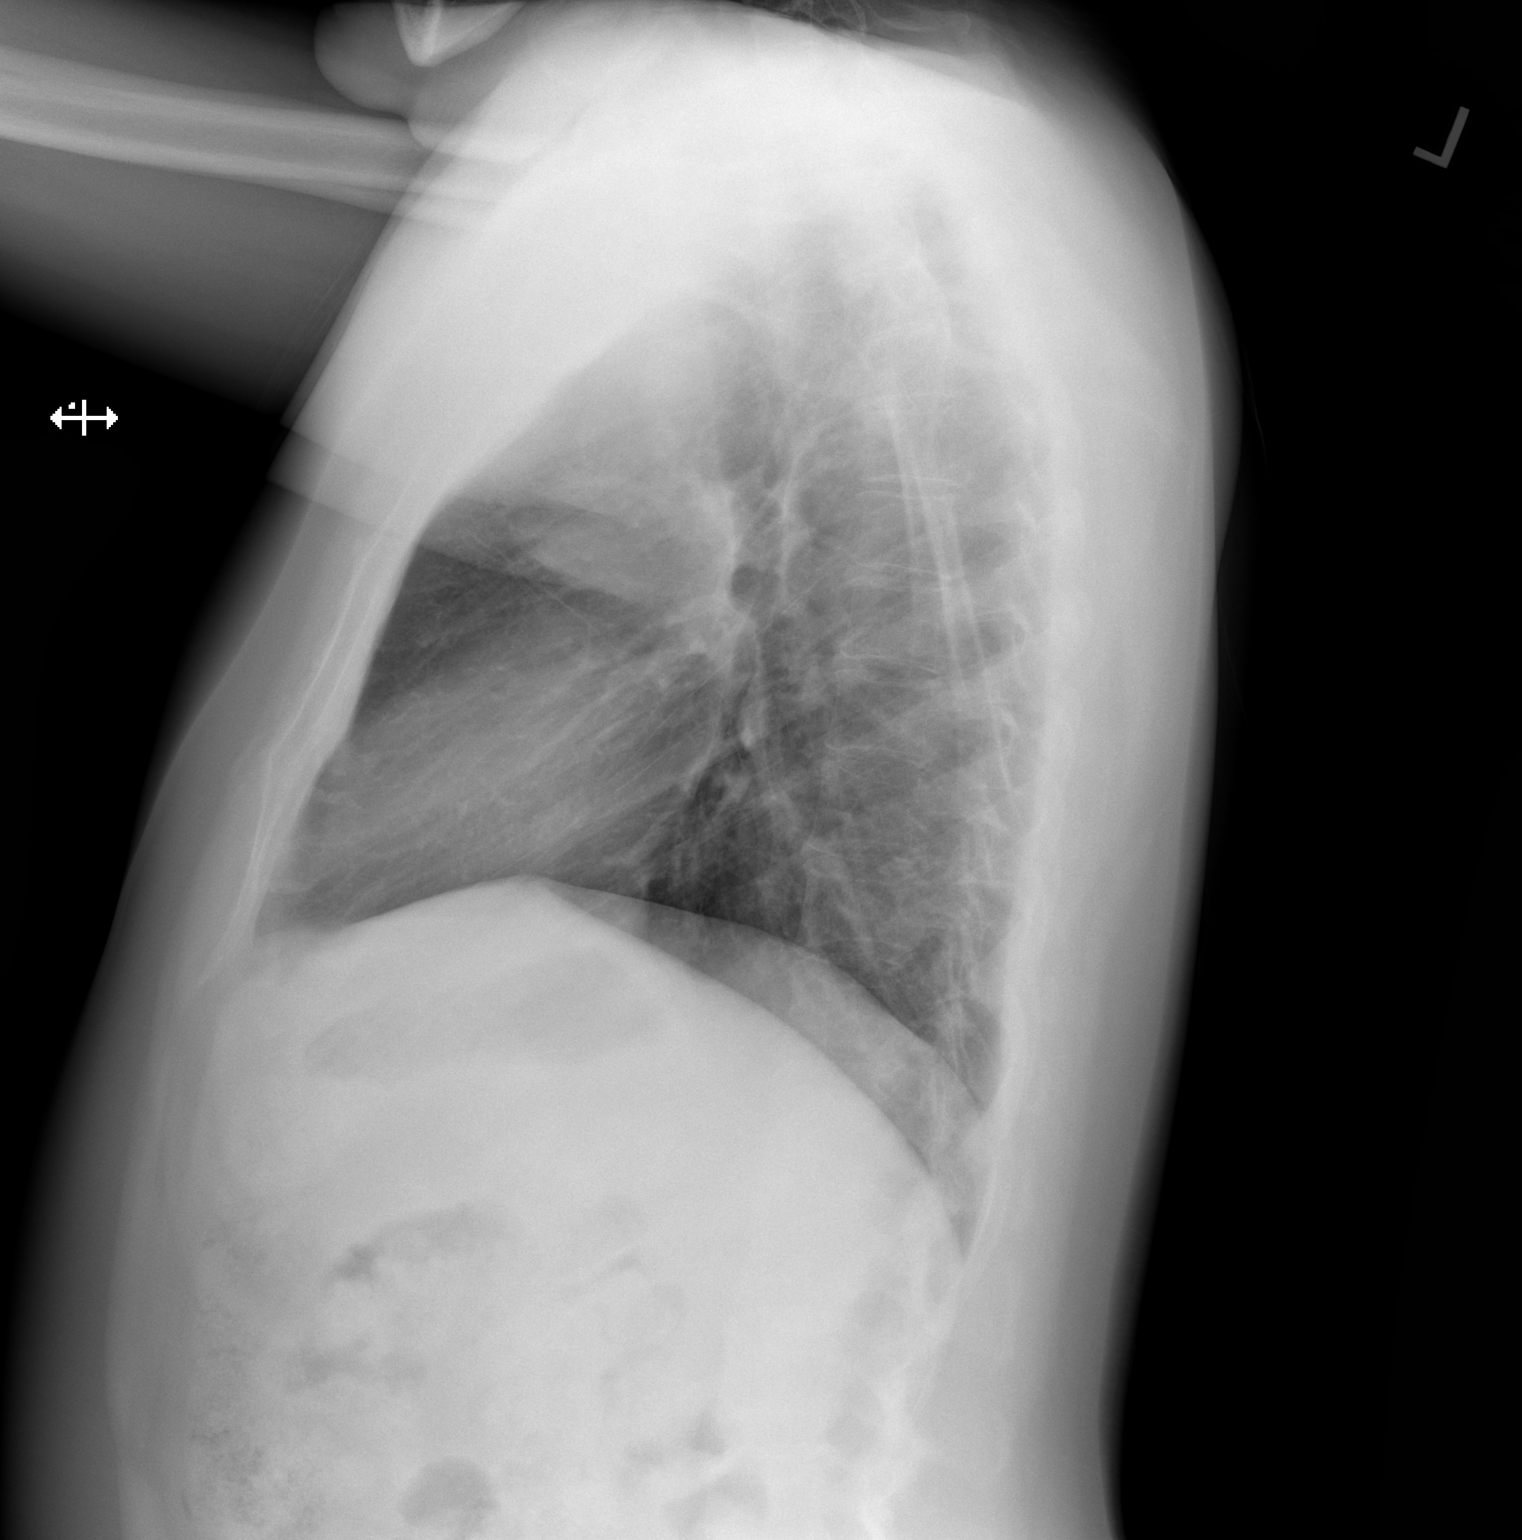

[2 of 2 positions shown; findings below may reference images not displayed]

FINDINGS: The heart size and mediastinal contours are within normal limits.
Both lungs are clear. No pneumothorax or pleural effusion is noted.
The visualized skeletal structures are unremarkable.
IMPRESSION: No active cardiopulmonary disease.

## 2021-10-06 ENCOUNTER — Ambulatory Visit: Payer: Commercial Managed Care - PPO

## 2021-10-13 ENCOUNTER — Ambulatory Visit: Payer: Commercial Managed Care - PPO

## 2021-10-20 ENCOUNTER — Other Ambulatory Visit: Payer: Self-pay

## 2021-10-20 ENCOUNTER — Ambulatory Visit: Payer: Commercial Managed Care - PPO | Attending: Family Medicine

## 2021-10-20 DIAGNOSIS — R2681 Unsteadiness on feet: Secondary | ICD-10-CM | POA: Diagnosis not present

## 2021-10-20 DIAGNOSIS — M6281 Muscle weakness (generalized): Secondary | ICD-10-CM

## 2021-10-20 DIAGNOSIS — R2689 Other abnormalities of gait and mobility: Secondary | ICD-10-CM

## 2021-10-20 DIAGNOSIS — R209 Unspecified disturbances of skin sensation: Secondary | ICD-10-CM | POA: Diagnosis present

## 2021-10-20 NOTE — Therapy (Signed)
?OUTPATIENT PHYSICAL THERAPY TREATMENT NOTE ? ? ?Patient Name: Joe Ray ?MRN: [id number] ?DOB:1981-06-22, 41 y.o., male ?Today's Date: 10/20/2021 ? ?PCP: Darreld Mclean, MD ?REFERRING PROVIDER: Darreld Mclean, MD ? ? PT End of Session - 10/20/21 1538   ? ? Visit Number 2   ? Number of Visits 9   ? Date for PT Re-Evaluation 11/27/21   ? Authorization Type UHC/UMR 2023 (VL: 60)   ? PT Start Time 7494   patient arriving late  ? PT Stop Time 4967   ? PT Time Calculation (min) 36 min   ? Activity Tolerance Patient tolerated treatment well   ? Behavior During Therapy [facility] for tasks assessed/performed   ? ?  ?  ? ?  ? ? ?Past Medical History:  ?Diagnosis Date  ? Allergy   ? ?No past surgical history on file. ?Patient Active Problem List  ? Diagnosis Date Noted  ? Iron deficiency 08/04/2020  ? Prediabetes 11/13/2019  ? Peripheral neuropathy 11/13/2019  ? G E R D 06/30/2010  ? COUGH 06/30/2010  ? ? ?REFERRING DIAG: G62.89 (ICD-10-CM) - Other polyneuropathy ? ?THERAPY DIAG:  ?Unsteadiness on feet ? ?Other abnormalities of gait and mobility ? ?Muscle weakness (generalized) ? ?Unspecified disturbances of skin sensation ? ?PERTINENT HISTORY: No Significant PMH ? ?PRECAUTIONS: Fall ? ?SUBJECTIVE: Reports feel like the symptoms have gotten worse over the past week. Has been very busy due to family emergency.  ? ?PAIN:  ?Are you having pain? Yes: NPRS scale: 4/10 ?Pain location: Feet ?Pain description: Bilateral ?Aggravating factors: Worse as the Day goes alone ?Relieving factors: None ? ? ? [facility] PT Assessment - 10/20/21 0001   ? ?  ? 6 Minute Walk- Baseline  ? 6 Minute Walk- Baseline yes   ? HR (bpm) 85   ? 02 Sat (%RA) 100 %   ? Modified Borg Scale for Dyspnea 0- Nothing at all   ?  ? 6 Minute walk- Post Test  ? 6 Minute Walk Post Test yes   ? HR (bpm) 80   ? 02 Sat (%RA) 100 %   ? Modified Borg Scale for Dyspnea 0.5- Very, very slight shortness of breath   ? Perceived Rate of Exertion (Borg) 13- Somewhat hard   ?  ? 6  minute walk test results   ? Aerobic Endurance Distance Walked 1318   ? Endurance additional comments without use of AD, reports pain in the R foot ranged up to 6/10, and then L foot ranged up to 4-5/10. Gradual increase in pain with increased duration.   ? ?  ?  ? ?  ? ? ?Completed the following exercises and established initial HEP. Provided Handout and [name] ?Access Code: [email or web address] ?URL: [email or web address] ?Date: 10/20/2021 ?Prepared by: Baldomero Lamy ? ?Exercises ?- Side Stepping with Resistance at Thighs  - 2 x daily - 7 x weekly - 1 sets - 2 reps ?Statistician Walk with Resistance  - 2 x daily - 7 x weekly - 1 sets - 2 reps ?- Bird Dog  - 2 x daily - 7 x weekly - 1 sets - 10 reps ? ? ? PT Short Term Goals - 09/10/21 1815   ? ?  ? PT SHORT TERM GOAL #1  ? Title Pt to be independent with initial HEP for balance/strength   ? Baseline no HEP established   ? Time 4   ? Period Weeks   ? Status New   ? Target Date  10/30/21   pushed out due to scheduling  ?  ? PT SHORT TERM GOAL #2  ? Title 6MWT TBA and LTG to be set as applicable   ? Baseline TBA; 1318 ft  ? Time 4   ? Period Weeks   ? Status Met  ?  ? PT SHORT TERM GOAL #3  ? Title Pt will improve situation 4 of  M-CTSIB to >/= 30 seconds   ? Baseline 25 seconds   ? Time 4   ? Period Weeks   ? Status New   ? ?  ?  ? ?  ? ? PT Long Term Goals - 09/10/21 1817   ? ?  ? PT LONG TERM GOAL #1  ? Title Pt to be independent with final and progressive HEP for strength/balance   ? Baseline no HEP established   ? Time 8   ? Period Weeks   ? Status New   ? Target Date 11/27/21   ?  ? PT LONG TERM GOAL #2  ? Title Pt will improve FGA to >/= 24/30 to demo improved balance and reduced fall risk   ? Baseline 20/30   ? Time 8   ? Period Weeks   ? Status New   ?  ? PT LONG TERM GOAL #3  ? Title Pt will be able to ambulate >/= 1500 ft with 6MWT and less than 4/10 pain   ? Baseline TBA   ? Time 8   ? Period Weeks   ? Status Revised   ?  ? PT LONG TERM  GOAL #4  ? Title Pt will improve SLS on LLE to >/= 15 seconds   ? Baseline SLS on RLE: 19.82 seconds; SLS on LLE: 12.23 seconds   ? Time 8   ? Period Weeks   ? Status New   ?  ? PT LONG TERM GOAL #5  ? Title Pt will improve 5x sit <> stand to </= 12 seconds to demo improved balance   ? Baseline 16.16 seconds   ? Time 8   ? Period Weeks   ? Status New   ? ?  ?  ? ?  ? ? Plan - 10/20/21 1613   ? ? Clinical Impression Statement Completed 6MWT, with patient able to ambulate 1318 ft so demonstrating relatively good endurance but continue to ave sigificant pain in BLE, espeically feet limiting activity tolerance. Rest of session focused on BLE strengthening and core strengthening. Pt tolerating well with intermittent rest breaks. WIll continue per POC.   ? Personal Factors and Comorbidities Time since onset of injury/illness/exacerbation   ? Examination-Activity Limitations Stand;Locomotion Level;Stairs   ? Examination-Participation Restrictions Occupation;Community Activity;[employer] Work   ? Stability/Clinical Decision Making Stable/Uncomplicated   ? Rehab Potential Good   ? PT Frequency 1x / week   ? PT Duration 8 weeks   ? PT Treatment/Interventions ADLs/Self Care Home Management;Aquatic Therapy;Electrical Stimulation;Canalith Repostioning;Moist Heat;Cryotherapy;DME Instruction;Stair training;Functional mobility training;Therapeutic activities;Neuromuscular re-education;Balance training;Therapeutic exercise;Patient/family education;Orthotic Fit/Training;Passive range of motion;Taping;Joint Manipulations;Manual techniques;Gait training   ? PT Next Visit Plan Potential need for foot up brace for longer distance ambulation due to fatigue. Review HEP focused on BLE strengthening and Balance   ? Consulted and Agree with Plan of Care Patient   ? ?  ?  ? ?  ? ? ? ? ?Jones Bales, PT, DPT ?10/20/2021, 4:14 PM ? ?  ? ?

## 2021-10-20 NOTE — Patient Instructions (Signed)
Access Code: [email or web address] ?URL: [email or web address] ?Date: 10/20/2021 ?Prepared by: Jethro Bastos ? ?Exercises ?- Side Stepping with Resistance at Thighs  - 2 x daily - 7 x weekly - 1 sets - 2 reps ?Actor Walk with Resistance  - 2 x daily - 7 x weekly - 1 sets - 2 reps ?- Bird Dog  - 2 x daily - 7 x weekly - 1 sets - 10 reps ?

## 2021-10-27 ENCOUNTER — Ambulatory Visit: Payer: Commercial Managed Care - PPO

## 2021-11-03 ENCOUNTER — Ambulatory Visit: Payer: Commercial Managed Care - PPO

## 2021-11-10 ENCOUNTER — Ambulatory Visit: Payer: Commercial Managed Care - PPO

## 2021-11-17 ENCOUNTER — Ambulatory Visit: Payer: Commercial Managed Care - PPO | Attending: Family Medicine

## 2021-11-24 ENCOUNTER — Ambulatory Visit: Payer: Commercial Managed Care - PPO

## 2022-02-01 ENCOUNTER — Encounter: Payer: Self-pay | Admitting: Family Medicine

## 2022-02-01 ENCOUNTER — Ambulatory Visit (INDEPENDENT_AMBULATORY_CARE_PROVIDER_SITE_OTHER): Payer: Commercial Managed Care - PPO | Admitting: Family Medicine

## 2022-02-01 VITALS — BP 102/67 | HR 60 | Temp 97.6°F | Ht 69.0 in | Wt 163.8 lb

## 2022-02-01 DIAGNOSIS — H6121 Impacted cerumen, right ear: Secondary | ICD-10-CM | POA: Diagnosis not present

## 2022-02-01 DIAGNOSIS — R5382 Chronic fatigue, unspecified: Secondary | ICD-10-CM | POA: Diagnosis not present

## 2022-02-01 DIAGNOSIS — H9203 Otalgia, bilateral: Secondary | ICD-10-CM

## 2022-02-01 LAB — IBC + FERRITIN
Ferritin: 141.6 ng/mL (ref 22.0–322.0)
Iron: 104 ug/dL (ref 42–165)
Saturation Ratios: 27.1 % (ref 20.0–50.0)
TIBC: 383.6 ug/dL (ref 250.0–450.0)
Transferrin: 274 mg/dL (ref 212.0–360.0)

## 2022-02-01 LAB — CBC
HCT: 44.7 % (ref 39.0–52.0)
Hemoglobin: 14.9 g/dL (ref 13.0–17.0)
MCHC: 33.4 g/dL (ref 30.0–36.0)
MCV: 86.3 fl (ref 78.0–100.0)
Platelets: 230 10*3/uL (ref 150.0–400.0)
RBC: 5.18 Mil/uL (ref 4.22–5.81)
RDW: 12.8 % (ref 11.5–15.5)
WBC: 6.7 10*3/uL (ref 4.0–10.5)

## 2022-02-01 LAB — COMPREHENSIVE METABOLIC PANEL
ALT: 25 U/L (ref 0–53)
AST: 20 U/L (ref 0–37)
Albumin: 4.6 g/dL (ref 3.5–5.2)
Alkaline Phosphatase: 65 U/L (ref 39–117)
BUN: 16 mg/dL (ref 6–23)
CO2: 29 mEq/L (ref 19–32)
Calcium: 9.6 mg/dL (ref 8.4–10.5)
Chloride: 104 mEq/L (ref 96–112)
Creatinine, Ser: 0.77 mg/dL (ref 0.40–1.50)
GFR: 111.7 mL/min (ref >60.00)
Glucose, Bld: 84 mg/dL (ref 70–99)
Potassium: 4.5 mEq/L (ref 3.5–5.1)
Sodium: 140 mEq/L (ref 135–145)
Total Bilirubin: 0.3 mg/dL (ref 0.2–1.2)
Total Protein: 6.8 g/dL (ref 6.0–8.3)

## 2022-02-01 LAB — VITAMIN B12: Vitamin B-12: 534 pg/mL (ref 211–911)

## 2022-02-01 NOTE — Patient Instructions (Signed)
Ear pain/pressure: -What I am able to see does not have any signs of infection. Start with Flonase (2 sprays in each nostril daily), warm compresses as needed. If you develop any worsening pain or fevers, let me know. Recommend trying debrox drops to soften the remaining wax in your right ear.   Fatigue: -Rechecking your labs today.

## 2022-02-01 NOTE — Progress Notes (Signed)
Acute Office Visit  Subjective:     Patient ID: Joe Ray, male    DOB: 09-Apr-1981, 41 y.o.   MRN: [id number]  CC: ear pain/pressure   Otalgia  There is pain in both ears. This is a new problem. The current episode started 1 to 4 weeks ago. The problem occurs constantly (bilateral, right worse than left). There has been no fever. The pain is at a severity of 5/10 (increases to 7/10 at times). The pain is moderate. Associated symptoms include headaches, rhinorrhea and a sore throat. Pertinent negatives include no abdominal pain, coughing, diarrhea, ear discharge, hearing loss, neck pain, rash or vomiting. Associated symptoms comments: Sensitive to sound and cold air; dizziness, nasal congestion, sinus pressure . He has tried ear drops and NSAIDs for the symptoms. The treatment provided no relief.   He would also like labs rechecked for his ongoing fatigue that PCP has worked up in the past.   ROS: All review of systems negative except what is listed in the HPI       Objective:    BP 102/67   Pulse 60   Temp 97.6 F (36.4 C)   Ht 5\' 9"  (1.753 m)   Wt 163 lb 12.8 oz (74.3 kg)   BMI 24.19 kg/m    Physical Exam Vitals reviewed.  Constitutional:      General: He is not in acute distress.    Appearance: Normal appearance. He is normal weight. He is not ill-appearing.  HENT:     Right Ear: External ear normal. There is impacted cerumen.     Left Ear: Ear canal and external ear normal.     Ears:     Comments: L TM mild retraction    Nose: Nose normal.  Eyes:     Conjunctiva/sclera: Conjunctivae normal.  Cardiovascular:     Rate and Rhythm: Normal rate and regular rhythm.  Pulmonary:     Effort: Pulmonary effort is normal.     Breath sounds: Normal breath sounds.  Musculoskeletal:     Cervical back: Normal range of motion and neck supple. No tenderness.  Lymphadenopathy:     Cervical: No cervical adenopathy.  Skin:    General: Skin is warm and dry.  Neurological:      General: No focal deficit present.     Mental Status: He is alert and oriented to person, place, and time. Mental status is at baseline.  Psychiatric:        Mood and Affect: Mood normal.        Thought Content: Thought content normal.        Judgment: Judgment normal.          No results found for any visits on 02/01/22.      Assessment & Plan:    1. Otalgia of both ears 2. Impacted cerumen of right ear Difficult to visualize entire right TM despite attempt to clear impaction. What is visible does not have any signs of infection, L TM with mild retraction, no signs of infection. Recommend start with Flonase twice daily for a few weeks, warm compresses, hydration, daily antihistamine. Try debrox drops in the right ear to finish softening/removing the wax. If any new symptoms develop, worsen, or you start having fevers, let 04/04/22 know. Indication: Cerumen impaction of the ear(s) Medical necessity statement: On physical examination, cerumen impairs clinically significant portions of the external auditory canal, and tympanic membrane. Noted obstructive, copious cerumen that cannot be removed without magnification and instrumentations  requiring skills Consent: Discussed benefits and risks of procedure and verbal consent obtained Procedure: Patient was prepped for the procedure. Utilized an otoscope to assess and take note of the ear canal, the tympanic membrane, and the presence, amount, and placement of the cerumen. Gentle water irrigation and  was utilized to remove cerumen.  Post procedure examination: shows cerumen was partially removed. Patient tolerated procedure well. The patient is made aware that they may experience temporary vertigo, temporary hearing loss, and temporary discomfort. If these symptom last for more than 24 hours to call the clinic or proceed to the ED.   3. Chronic fatigue Updating labs today.  - CBC - Comprehensive metabolic panel - Vitamin B12 - IBC +  Ferritin     Return if symptoms worsen or fail to improve.  Clayborne Dana, NP

## 2022-02-10 ENCOUNTER — Encounter: Payer: Self-pay | Admitting: Family Medicine

## 2022-02-10 DIAGNOSIS — H9203 Otalgia, bilateral: Secondary | ICD-10-CM

## 2022-02-12 MED ORDER — CEFDINIR 300 MG PO CAPS: 300.0000 mg | ORAL_CAPSULE | Freq: Two times a day (BID) | ORAL | 0 refills | Status: AC

## 2022-03-17 ENCOUNTER — Ambulatory Visit (INDEPENDENT_AMBULATORY_CARE_PROVIDER_SITE_OTHER): Payer: Commercial Managed Care - PPO | Admitting: Family Medicine

## 2022-03-17 ENCOUNTER — Encounter: Payer: Self-pay | Admitting: Family Medicine

## 2022-03-17 VITALS — BP 112/62 | HR 65 | Temp 97.9°F | Resp 18 | Ht 69.0 in | Wt 166.6 lb

## 2022-03-17 DIAGNOSIS — M792 Neuralgia and neuritis, unspecified: Secondary | ICD-10-CM

## 2022-03-17 DIAGNOSIS — G6289 Other specified polyneuropathies: Secondary | ICD-10-CM

## 2022-03-17 DIAGNOSIS — R5382 Chronic fatigue, unspecified: Secondary | ICD-10-CM | POA: Diagnosis not present

## 2022-03-17 DIAGNOSIS — M79672 Pain in left foot: Secondary | ICD-10-CM

## 2022-03-17 DIAGNOSIS — H9203 Otalgia, bilateral: Secondary | ICD-10-CM

## 2022-03-17 DIAGNOSIS — N5089 Other specified disorders of the male genital organs: Secondary | ICD-10-CM

## 2022-03-17 DIAGNOSIS — M79671 Pain in right foot: Secondary | ICD-10-CM

## 2022-03-17 MED ORDER — CIPROFLOXACIN-DEXAMETHASONE 0.3-0.1 % OT SUSP: 4.0000 [drp] | Freq: Two times a day (BID) | OTIC | 0 refills | Status: DC

## 2022-03-17 NOTE — Patient Instructions (Signed)
Please give Dr Posey Boyer office a call and request that they give you a new neurologist now that he is retired   Leanna Battles, Jenelle Mages, MD   [address]   [place], [place] [address]   Phone: [phone]    Try the ear drops I am going to see if rheumatology might see you

## 2022-03-17 NOTE — Telephone Encounter (Signed)
Joe Ray,   The Rheumatology office will contact you to set your appointment up.

## 2022-03-17 NOTE — Progress Notes (Signed)
Healthcare at [facility] [address], [address] [place], [place] [address] [phone]  Date:  03/17/2022   Name:  Joe Ray   DOB:  November 19, 1980   MRN:  [id number]  PCP:  Pearline Cables, MD    Chief Complaint: Ear Pain (Seen by Ladona Ridgel a couple of weeks ago and the pain has returned. He would also like to discuss the Neuropathy. )   History of Present Illness:  REMBERT BROWE is a 41 y.o. very pleasant male patient who presents with the following:  Pt is being seen today for concern of neuropathy in his feet and a few other concerns He has been seen multiple times by myself and 2 different neurologists for this issue, and we have tried treating him for iron deficiency per hematology  Most recent visit with myself was in November - at that time his neuropathy was somewhat better  B12 and ferritin were normal in July of this year  Today Aqib notes his neuropathy is waxing and waning.  He feels like physical fitness work is helping, and he is also doing some acupuncture His feet get worse towards the end of the week- he does a lot of walking at his job with [employer] which adds up He does feel like he has some pain in his lower back as well, and is doing PT for this  He notes genital pain for about 18 months, he is doing pelvic PT  He notes "tingling and shock waves and pains" in his genitals off and on He has seen urology and tried a couple of medications-for sildenafil, currently he is using tadalafil He was seen at urology by their NP yesterday-[facility]-she had him start on a course of meloxicam, he will be seen a physician for consultation next week   He saw Ladona Ridgel our NP on 7/10 for ear pain- they cleaned out his ears and suggested flonase, then they added cefdinir for 10 days He notes that his ears are still really sensitive to the cold or pressure like if he opens the window in his car.  Both ears are still bothering him  He continues to  take B12 and iron supplements   Hematology note 1/22;  Mild iron deficiency without anemia: Given his extraordinary amount of neuropathy without any clear-cut etiology and lack of response to B12 replacement therapy, we will attempt to treat him with intravenous iron therapy. I was skeptical that the IV iron is going to help him significantly but we will give this a try. If he does not improve then we can consider additional oral iron replacement therapies for maintenance. Peripheral neuropathy: Unclear etiology, patient sees Dr. Marjory Lies as well as another neurologist at [facility]. He had MRIs in the past. Patient works at [employer] and stays extremely busy. We discussed the iron absorption pathway. 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 This is accompanied by vegetarian diet, might be the cause of his low iron levels.  Patient also notes his current neurologist at [facility], Dr. Leanna Battles has retired; he would like to find a new urology physician Patient Active Problem List   Diagnosis Date Noted   Iron deficiency 08/04/2020   Prediabetes 11/13/2019   Peripheral neuropathy 11/13/2019   G E R D 06/30/2010   COUGH 06/30/2010    Past Medical History:  Diagnosis Date   Allergy     No past surgical history on  file.  Social History   Tobacco Use   Smoking status: Never   Smokeless tobacco: Never  Vaping Use   Vaping Use: Never used  Substance Use Topics   Alcohol use: Never   Drug use: Never    Family History  Problem Relation Age of Onset   Diabetes Father     Allergies  Allergen Reactions   Amoxicillin Diarrhea and Rash    Medication list has been reviewed and updated.  Current Outpatient Medications on File Prior to Visit  Medication Sig Dispense Refill   Cholecalciferol (VITAMIN D-3) 1000 units CAPS Take by mouth.     MELOXICAM PO Take by mouth.     Methylcobalamin 1000 MCG TBDP Take by mouth.     Multiple Vitamin (MULTIVITAMIN)  tablet Take 1 tablet by mouth daily.     TADALAFIL PO Take by mouth.     No current facility-administered medications on file prior to visit.    Review of Systems:  As per HPI- otherwise negative.   Physical Examination: Vitals:   03/17/22 0906  BP: 112/62  Pulse: 65  Resp: 18  Temp: 97.9 F (36.6 C)  SpO2: 99%   Vitals:   03/17/22 0906  Weight: 166 lb 9.6 oz (75.6 kg)  Height: 5\' 9"  (1.753 m)   Body mass index is 24.6 kg/m. Ideal Body Weight: Weight in (lb) to have BMI = 25: 168.9  GEN: no acute distress.  Normal weight, looks well HEENT: Atraumatic, Normocephalic.  Ears and Nose: No external deformity. CV: RRR, No M/G/R. No JVD. No thrill. No extra heart sounds. PULM: CTA B, no wheezes, crackles, rhonchi. No retractions. No resp. distress. No accessory muscle use. ABD: S, NT, ND EXTR: No c/c/e PSYCH: Normally interactive. Conversant.  There is cerumen in the right ear canal, patient notes that he attempted to remove this in July but were not successful.  Verbal consent obtained, wax is removed from right ear canal with irrigation.  Patient tolerated well with no immediate complications.  Both TMs appear normal.  There are some mild irritation of the right ear canal likely from irrigation  Assessment and Plan: Nerve pain  Otalgia of both ears - Plan: ciprofloxacin-dexamethasone (CIPRODEX) OTIC suspension  Chronic fatigue - Plan: Ambulatory referral to Rheumatology  Other polyneuropathy - Plan: Ambulatory referral to Rheumatology  Pain in both feet - Plan: Ambulatory referral to Rheumatology  Pain of male genitalia - Plan: Ambulatory referral to Rheumatology  Patient seen today for follow-up.  Unfortunately he has suffered from neuropathy in both feet for few years, extensive work-up and trials of several different medications including Cymbalta, fluoxetine, gabapentin, Lyrica, iron supplementation have not been overly helpful.    In addition, he notes pain,  numbness and tingling in his genitals as well as erectile dysfunction.  He also now notes that both ears to be very sensitive to cold (he has noted symptoms with "cold air", current temperatures outside in the 90s)  Plan to follow-up with neurology-he will call [facility] and request a new physician since his previous provider has retired.  He is being seen by urology again next week  I will place a rheumatology referral, explained to patient I am not certain if they will be willing to see him but at this point a consultation could be helpful.  We have exhausted other avenues and would like to further investigate this could be is an autoimmune disorder   Signed August, MD

## 2022-03-30 NOTE — Telephone Encounter (Signed)
Are you able to check on this referral?  

## 2022-04-28 ENCOUNTER — Telehealth: Payer: Self-pay | Admitting: Family Medicine

## 2022-04-28 NOTE — Telephone Encounter (Signed)
[facility] called asking if we could fax over the nerve conduction study the patient had done. She stated it could be faxed to [phone]. For questions, Lovey Newcomer can be reached at [phone]. Please advise.

## 2022-04-28 NOTE — Telephone Encounter (Signed)
Tried calling this number back- it was incorrect.   Also they will have to call Dr Malone-Neurology at [phone] for this- we do not do this study here in primary care. (OV date 04/20/21).

## 2022-05-03 NOTE — Telephone Encounter (Signed)
Phone number was [phone].

## 2022-05-05 ENCOUNTER — Encounter: Payer: Self-pay | Admitting: Family Medicine

## 2022-05-05 DIAGNOSIS — H9203 Otalgia, bilateral: Secondary | ICD-10-CM

## 2022-05-05 NOTE — Telephone Encounter (Signed)
Spoke to [facility] and gave them Dr. Christianne Dolin number.

## 2022-05-07 ENCOUNTER — Encounter: Payer: Self-pay | Admitting: Family

## 2022-05-07 ENCOUNTER — Ambulatory Visit (INDEPENDENT_AMBULATORY_CARE_PROVIDER_SITE_OTHER): Payer: Commercial Managed Care - PPO | Admitting: Family

## 2022-05-07 VITALS — BP 110/60 | HR 92 | Temp 98.2°F | Ht 68.0 in | Wt 166.2 lb

## 2022-05-07 DIAGNOSIS — H9203 Otalgia, bilateral: Secondary | ICD-10-CM

## 2022-05-07 NOTE — Progress Notes (Signed)
  Joe Ray is a 41 y.o. male with the following history as recorded in EpicCare:  Patient Active Problem List   Diagnosis Date Noted   Iron deficiency 08/04/2020   Prediabetes 11/13/2019   Peripheral neuropathy 11/13/2019   G E R D 06/30/2010   COUGH 06/30/2010    Current Outpatient Medications  Medication Sig Dispense Refill   Cholecalciferol (VITAMIN D-3) 1000 units CAPS Take by mouth.     Methylcobalamin 1000 MCG TBDP Take by mouth.     Multiple Vitamin (MULTIVITAMIN) tablet Take 1 tablet by mouth daily.     TADALAFIL PO Take by mouth.     No current facility-administered medications for this visit.    Allergies: Amoxicillin  Past Medical History:  Diagnosis Date   Allergy     No past surgical history on file.  Family History  Problem Relation Age of Onset   Diabetes Father     Social History   Tobacco Use   Smoking status: Never   Smokeless tobacco: Never  Substance Use Topics   Alcohol use: Never    Subjective:   Patient has been struggling with recurrent intermittent episodes of bilateral ear pain; no relief with Flonase or Cortisporin otic; his PCP did recently refer him to ENT- has not been contacted to schedule; per patient, he has no underlying dental concerns and last dental exam in April 2023 was normal;  His PCP asked him to be seen "just to make sure that nothing had changed in the interim."      Objective:  Vitals:   05/07/22 1556  BP: 110/60  Pulse: 92  Temp: 98.2 F (36.8 C)  TempSrc: Oral  SpO2: 98%  Weight: 166 lb 3.2 oz (75.4 kg)  Height: 5\' 8"  (1.727 m)    General: Well developed, well nourished, in no acute distress  Skin : Warm and dry.  Head: Normocephalic and atraumatic  Eyes: Sclera and conjunctiva clear; pupils round and reactive to light; extraocular movements intact  Ears: External normal; canals clear; tympanic membranes congested Oropharynx: Pink, supple. No suspicious lesions  Neck: Supple without thyromegaly, adenopathy   Lungs: Respirations unlabored;  Neurologic: Alert and oriented; speech intact; face symmetrical; moves all extremities well; CNII-XII intact without focal deficit   Assessment:  1. Ear pain, bilateral     Plan:  No obvious source of symptoms found; ? TMJ or part of suspected underlying rheumatologic condition; recommend that he re-start his Flonase and keep planned follow up with ENT.  No follow-ups on file.  No orders of the defined types were placed in this encounter.   Requested Prescriptions    No prescriptions requested or ordered in this encounter

## 2022-07-16 ENCOUNTER — Other Ambulatory Visit: Payer: Self-pay | Admitting: Chiropractic Medicine

## 2022-07-16 DIAGNOSIS — R1030 Lower abdominal pain, unspecified: Secondary | ICD-10-CM

## 2022-07-16 DIAGNOSIS — R39198 Other difficulties with micturition: Secondary | ICD-10-CM

## 2022-07-24 ENCOUNTER — Encounter (HOSPITAL_COMMUNITY): Payer: Self-pay

## 2022-07-24 ENCOUNTER — Ambulatory Visit (HOSPITAL_COMMUNITY)
Admission: EM | Admit: 2022-07-24 | Discharge: 2022-07-24 | Disposition: A | Discharge status: Home / Self Care | Payer: Commercial Managed Care - PPO | Attending: Emergency Medicine | Admitting: Emergency Medicine

## 2022-07-24 DIAGNOSIS — R109 Unspecified abdominal pain: Secondary | ICD-10-CM

## 2022-07-24 DIAGNOSIS — G629 Polyneuropathy, unspecified: Secondary | ICD-10-CM | POA: Diagnosis present

## 2022-07-24 LAB — COMPREHENSIVE METABOLIC PANEL
ALT: 47 U/L — ABNORMAL HIGH (ref 0–44)
AST: 33 U/L (ref 15–41)
Albumin: 3.8 g/dL (ref 3.5–5.0)
Alkaline Phosphatase: 55 U/L (ref 38–126)
Anion gap: 7 (ref 5–15)
BUN: 11 mg/dL (ref 6–20)
CO2: 25 mmol/L (ref 22–32)
Calcium: 8.7 mg/dL — ABNORMAL LOW (ref 8.9–10.3)
Chloride: 106 mmol/L (ref 98–111)
Creatinine, Ser: 0.75 mg/dL (ref 0.61–1.24)
GFR, Estimated: >60 mL/min (ref >60)
Glucose, Bld: 86 mg/dL (ref 70–99)
Potassium: 3.7 mmol/L (ref 3.5–5.1)
Sodium: 138 mmol/L (ref 135–145)
Total Bilirubin: 0.4 mg/dL (ref 0.3–1.2)
Total Protein: 6.9 g/dL (ref 6.5–8.1)

## 2022-07-24 LAB — POCT URINALYSIS DIPSTICK, ED / UC
Bilirubin Urine: NEGATIVE
Glucose, UA: NEGATIVE mg/dL
Ketones, ur: NEGATIVE mg/dL
Leukocytes,Ua: NEGATIVE
Nitrite: NEGATIVE
Protein, ur: NEGATIVE mg/dL
Specific Gravity, Urine: 1.02 (ref 1.005–1.030)
Urobilinogen, UA: 0.2 mg/dL (ref 0.0–1.0)
pH: 7 (ref 5.0–8.0)

## 2022-07-24 LAB — CBC
HCT: 41.5 % (ref 39.0–52.0)
Hemoglobin: 13.8 g/dL (ref 13.0–17.0)
MCH: 28.6 pg (ref 26.0–34.0)
MCHC: 33.3 g/dL (ref 30.0–36.0)
MCV: 85.9 fL (ref 80.0–100.0)
Platelets: 253 10*3/uL (ref 150–400)
RBC: 4.83 MIL/uL (ref 4.22–5.81)
RDW: 12.4 % (ref 11.5–15.5)
WBC: 5.9 10*3/uL (ref 4.0–10.5)
nRBC: 0 % (ref 0.0–0.2)

## 2022-07-24 MED ORDER — KETOROLAC TROMETHAMINE 30 MG/ML IJ SOLN
INTRAMUSCULAR | Status: AC
  Filled 2022-07-24: qty 1

## 2022-07-24 MED ORDER — KETOROLAC TROMETHAMINE 30 MG/ML IJ SOLN
30.0000 mg | Freq: Once | INTRAMUSCULAR | Status: AC
  Administered 2022-07-24: 30 mg via INTRAMUSCULAR

## 2022-07-24 MED ORDER — TAMSULOSIN HCL 0.4 MG PO CAPS: 0.4000 mg | ORAL_CAPSULE | Freq: Every day | ORAL | 0 refills | Status: DC

## 2022-07-24 NOTE — ED Triage Notes (Signed)
Onset leg pain and gluteal pain for 3-4 days. Pt reports lower back pain that started over the last couple of days. Pt is treating his pain with Tylenol.

## 2022-07-24 NOTE — Discharge Instructions (Addendum)
We will call you if any of your test results warrant a change in your plan of care, you may view these test results on MyChart.   Please make sure to drink plenty of water, drink at least 8 cups of water daily.   Flomax has been sent to the pharmacy, if symptoms are not improving please begin taking this medicine, this can help with passage of the potential kidney stone.  Before taking the Flomax please discontinue the tadalafil temporarily until finishing the Flomax, these 2 medicines together could lower your blood pressure.   As discussed, if your symptoms progressively worsen at home before your next doctor's appointment, please go to the nearest emergency department.

## 2022-07-24 NOTE — ED Provider Notes (Incomplete)
[place]    CSN: [id number] Arrival date & time: 07/24/22  1246      History   Chief Complaint Chief Complaint  Patient presents with   Hip Pain   Leg Pain   Rectal Pain    HPI Joe Ray is a 41 y.o. male.  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  He states that today his symptoms have worsened.  He states that he has worsening pain on the left side of his lower back , left buttocks, and left leg.  He is concerned due to the worsening in his symptoms.  He reports that he is currently following up with a neurologist and physical therapist, they are uncertain as to why his symptoms are occurring, but he has an appointment with them in several weeks.  He denies any fall or trauma that occurred this past week.  He denies any dysuria.  He reports that the left-sided lower back pain is a concern for him since it is worsened.  Patient reports he chronically has burning along his penile shaft, it has worsened since the symptoms started.  He denies any dysuria.   Patient reports that he thinks his father may have had kidney stones.  The patient reports that he drinks plenty of water and Gatorade with zero sugar, only drinks chai tea in the mornings.   Hip Pain  Leg Pain Associated symptoms: no fatigue and no fever     Past Medical History:  Diagnosis Date   Allergy     Patient Active Problem List   Diagnosis Date Noted   Iron deficiency 08/04/2020   Prediabetes 11/13/2019   Peripheral neuropathy 11/13/2019   G E R D 06/30/2010   COUGH 06/30/2010    History reviewed. No pertinent surgical history.     Home Medications    Prior to Admission medications   Medication Sig Start Date End Date Taking? Authorizing Provider  tamsulosin (FLOMAX) 0.4 MG CAPS capsule Take 1 capsule (0.4 mg total) by mouth daily. 07/24/22  Yes Flossie Dibble, NP  Cholecalciferol (VITAMIN D-3) 1000  units CAPS Take by mouth.    [provider]  Methylcobalamin 1000 MCG TBDP Take by mouth.    [provider]  Multiple Vitamin (MULTIVITAMIN) tablet Take 1 tablet by mouth daily.    [provider]  TADALAFIL PO Take by mouth.    [provider]    Family History Family History  Problem Relation Age of Onset   Diabetes Father     Social History Social History   Tobacco Use   Smoking status: Never   Smokeless tobacco: Never  Vaping Use   Vaping Use: Never used  Substance Use Topics   Alcohol use: Never   Drug use: Never     Allergies   Amoxicillin   Review of Systems Review of Systems  Constitutional:  Positive for chills. Negative for activity change, appetite change, fatigue and fever.  Gastrointestinal:  Negative for abdominal distention, diarrhea, nausea and vomiting.  Genitourinary:  Positive for flank pain (Left sided). Negative for decreased urine volume, difficulty urinating, dysuria, frequency, genital sores, hematuria, penile discharge, penile pain, penile swelling, scrotal swelling, testicular pain and urgency.  Musculoskeletal:        Neuropathy along bilateral lower back, worsened on left side.  Neuropathy down legs bilaterally.   Skin:        Denies any skin changes to  penile or scrotal area      Physical Exam Triage Vital Signs ED Triage Vitals  Enc Vitals Group     BP 07/24/22 1513 116/81     Pulse Rate 07/24/22 1513 70     Resp 07/24/22 1518 18     Temp 07/24/22 1513 97.8 F (36.6 C)     Temp Source 07/24/22 1513 Oral     SpO2 07/24/22 1513 100 %     Weight --      Height --      Head Circumference --      Peak Flow --      Pain Score 07/24/22 1520 8     Pain Loc --      Pain Edu? --      Excl. in [place]? --    No data found.  Updated Vital Signs BP 116/81 (BP Location: Left Arm)   Pulse 70   Temp 97.8 F (36.6 C) (Oral)   Resp 18   SpO2 100%   Physical Exam Vitals and nursing note reviewed.   Abdominal:     General: Abdomen is flat.     Palpations: Abdomen is soft.     Tenderness: There is no abdominal tenderness. There is left CVA tenderness. There is no right CVA tenderness, guarding or rebound. Negative signs include McBurney's sign.  Musculoskeletal:     Thoracic back: Tenderness (Bilateral) and bony tenderness present. No swelling, edema, signs of trauma or spasms. Normal range of motion.     Lumbar back: Tenderness (Bilateral) and bony tenderness present. No swelling or spasms. Normal range of motion. Positive right straight leg raise test and positive left straight leg raise test.  Neurological:     Mental Status: He is alert.      UC Treatments / Results  Labs (all labs ordered are listed, but only abnormal results are displayed) Labs Reviewed  POCT URINALYSIS DIPSTICK, ED / UC - Abnormal; Notable for the following components:      Result Value   Hgb urine dipstick LARGE (*)    All other components within normal limits  URINE CULTURE  COMPREHENSIVE METABOLIC PANEL  CBC    EKG   Radiology No results found.  Procedures Procedures (including critical care time)  Medications Ordered in UC Medications  ketorolac (TORADOL) 30 MG/ML injection 30 mg (has no administration in time range)    Initial Impression / Assessment and Plan / UC Course  I have reviewed the triage vital signs and the nursing notes.  Pertinent labs & imaging results that were available during my care of the patient were reviewed by me and considered in my medical decision making (see chart for details).     Patient was evaluated for chronic polyneuropathy and acute left flank pain.  Urinalysis showed large blood , otherwise unremarkable. Uncertain etiology of symptoms.  Some suspicion of nephrolithiasis based on physical assessment and urinalysis and potentially nephrolithiasis causing worsening chronic polyneuropathy. Toradol injection given in office.  Flomax sent to the pharmacy,  patient was made aware treatment regiment, he states that he will hold off on taking the Flomax and see if his symptoms will resolve on its own.  This Probation officer made patient aware that if he begins taking the Flomax he will need to temporarily discontinue tadalafil due to a potential side effect of blood pressure lowering.  Patient made aware of the resources provided in urgent care and if symptoms do not improve he will need to go to the  emergency room for a CT scan of his kidney.  Shared decision-making was used to determine we will begin with this treatment plan. Patient was made aware that if symptoms worsen he will need to go to the nearest emergency department.  He was offered gabapentin for chronic neuropathy, patient declined stating that this medicine does not help him and he usually just takes Tylenol. Patient verbalized understanding of instructions.  Charting was provided using a a verbal dictation system, charting was proofread for errors, errors may occur which could change the meaning of the information charted.   Final Clinical Impressions(s) / UC Diagnoses   Final diagnoses:  Chronic polyneuropathy  Acute left flank pain     Discharge Instructions      We will call you if any of your test results warrant a change in your plan of care, you may view these test results on MyChart.   Please make sure to drink plenty of water, drink at least 8 cups of water daily.   Flomax has been sent to the pharmacy, if symptoms are not improving please begin taking this medicine, this can help with passage of the potential kidney stone.  Before taking the Flomax please discontinue the tadalafil temporarily until finishing the Flomax, these 2 medicines together could lower your blood pressure.   As discussed, if your symptoms progressively worsen at home before your next doctor's appointment, please go to the nearest emergency department.      ED Prescriptions     Medication Sig Dispense Auth.  Provider   tamsulosin (FLOMAX) 0.4 MG CAPS capsule Take 1 capsule (0.4 mg total) by mouth daily. 7 capsule Debby Freiberg, NP      PDMP not reviewed this encounter.

## 2022-07-24 NOTE — ED Triage Notes (Signed)
Denies any falls

## 2022-07-25 LAB — URINE CULTURE
Culture: NO GROWTH
Special Requests: NORMAL

## 2022-08-03 ENCOUNTER — Encounter: Payer: Self-pay | Admitting: Chiropractic Medicine

## 2022-08-04 ENCOUNTER — Ambulatory Visit
Admission: RE | Admit: 2022-08-04 | Discharge: 2022-08-04 | Disposition: A | Discharge status: Home / Self Care | Payer: Commercial Managed Care - PPO | Source: Ambulatory Visit | Attending: Chiropractic Medicine | Admitting: Chiropractic Medicine

## 2022-08-04 DIAGNOSIS — R39198 Other difficulties with micturition: Secondary | ICD-10-CM

## 2022-08-04 DIAGNOSIS — R1030 Lower abdominal pain, unspecified: Secondary | ICD-10-CM

## 2022-08-04 MED ORDER — GADOPICLENOL 0.5 MMOL/ML IV SOLN
7.0000 mL | Freq: Once | INTRAVENOUS | Status: AC | PRN
  Administered 2022-08-04: 7 mL via INTRAVENOUS

## 2023-01-27 ENCOUNTER — Encounter (HOSPITAL_BASED_OUTPATIENT_CLINIC_OR_DEPARTMENT_OTHER): Payer: Self-pay

## 2023-01-27 ENCOUNTER — Emergency Department (HOSPITAL_BASED_OUTPATIENT_CLINIC_OR_DEPARTMENT_OTHER): Payer: Commercial Managed Care - PPO

## 2023-01-27 ENCOUNTER — Other Ambulatory Visit: Payer: Self-pay

## 2023-01-27 ENCOUNTER — Emergency Department (HOSPITAL_BASED_OUTPATIENT_CLINIC_OR_DEPARTMENT_OTHER)
Admission: EM | Admit: 2023-01-27 | Discharge: 2023-01-27 | Disposition: A | Discharge status: Home / Self Care | Payer: Commercial Managed Care - PPO | Source: Home / Self Care | Attending: Emergency Medicine | Admitting: Emergency Medicine

## 2023-01-27 DIAGNOSIS — R21 Rash and other nonspecific skin eruption: Secondary | ICD-10-CM | POA: Diagnosis not present

## 2023-01-27 DIAGNOSIS — M790 Rheumatism, unspecified: Secondary | ICD-10-CM | POA: Diagnosis not present

## 2023-01-27 DIAGNOSIS — L03113 Cellulitis of right upper limb: Secondary | ICD-10-CM | POA: Insufficient documentation

## 2023-01-27 DIAGNOSIS — R000 Tachycardia, unspecified: Secondary | ICD-10-CM | POA: Insufficient documentation

## 2023-01-27 DIAGNOSIS — Z20822 Contact with and (suspected) exposure to covid-19: Secondary | ICD-10-CM | POA: Insufficient documentation

## 2023-01-27 LAB — CBC WITH DIFFERENTIAL/PLATELET
Abs Immature Granulocytes: 0.04 10*3/uL (ref 0.00–0.07)
Basophils Absolute: 0 10*3/uL (ref 0.0–0.1)
Basophils Relative: 0 %
Eosinophils Absolute: 0 10*3/uL (ref 0.0–0.5)
Eosinophils Relative: 0 %
HCT: 50.5 % (ref 39.0–52.0)
Hemoglobin: 17.2 g/dL — ABNORMAL HIGH (ref 13.0–17.0)
Immature Granulocytes: 0 %
Lymphocytes Relative: 7 %
Lymphs Abs: 0.8 10*3/uL (ref 0.7–4.0)
MCH: 28.8 pg (ref 26.0–34.0)
MCHC: 34.1 g/dL (ref 30.0–36.0)
MCV: 84.4 fL (ref 80.0–100.0)
Monocytes Absolute: 0.3 10*3/uL (ref 0.1–1.0)
Monocytes Relative: 3 %
Neutro Abs: 10.9 10*3/uL — ABNORMAL HIGH (ref 1.7–7.7)
Neutrophils Relative %: 90 %
Platelets: 283 10*3/uL (ref 150–400)
RBC: 5.98 MIL/uL — ABNORMAL HIGH (ref 4.22–5.81)
RDW: 12.3 % (ref 11.5–15.5)
WBC: 12.1 10*3/uL — ABNORMAL HIGH (ref 4.0–10.5)
nRBC: 0 % (ref 0.0–0.2)

## 2023-01-27 LAB — BASIC METABOLIC PANEL
Anion gap: 8 (ref 5–15)
BUN: 22 mg/dL — ABNORMAL HIGH (ref 6–20)
CO2: 25 mmol/L (ref 22–32)
Calcium: 9.1 mg/dL (ref 8.9–10.3)
Chloride: 104 mmol/L (ref 98–111)
Creatinine, Ser: 0.81 mg/dL (ref 0.61–1.24)
GFR, Estimated: >60 mL/min (ref >60)
Glucose, Bld: 114 mg/dL — ABNORMAL HIGH (ref 70–99)
Potassium: 4.1 mmol/L (ref 3.5–5.1)
Sodium: 137 mmol/L (ref 135–145)

## 2023-01-27 LAB — SARS CORONAVIRUS 2 BY RT PCR: SARS Coronavirus 2 by RT PCR: NEGATIVE

## 2023-01-27 MED ORDER — SODIUM CHLORIDE 0.9 % IV BOLUS
1000.0000 mL | Freq: Once | INTRAVENOUS | Status: AC
  Administered 2023-01-27: 1000 mL via INTRAVENOUS

## 2023-01-27 MED ORDER — IBUPROFEN 800 MG PO TABS
800.0000 mg | ORAL_TABLET | Freq: Once | ORAL | Status: AC
  Administered 2023-01-27: 800 mg via ORAL
  Filled 2023-01-27: qty 1

## 2023-01-27 MED ORDER — ACETAMINOPHEN 325 MG PO TABS: 650.0000 mg | ORAL_TABLET | Freq: Once | ORAL | Status: AC

## 2023-01-27 MED ORDER — DOXYCYCLINE HYCLATE 100 MG PO TABS
100.0000 mg | ORAL_TABLET | Freq: Once | ORAL | Status: AC
  Administered 2023-01-27: 100 mg via ORAL
  Filled 2023-01-27: qty 1

## 2023-01-27 MED ORDER — DOXYCYCLINE HYCLATE 100 MG PO CAPS: 100.0000 mg | ORAL_CAPSULE | Freq: Two times a day (BID) | ORAL | 0 refills | Status: DC

## 2023-01-27 MED ORDER — ACETAMINOPHEN 325 MG PO TABS
ORAL_TABLET | ORAL | Status: AC
  Administered 2023-01-27: 650 mg via ORAL
  Filled 2023-01-27: qty 2

## 2023-01-27 NOTE — ED Provider Notes (Signed)
[place] EMERGENCY DEPARTMENT AT [facility] Provider Note   CSN: [id number] Arrival date & time: 01/27/23  1432     History  Chief Complaint  Patient presents with   Arm Swelling   Arm Pain    Joe Ray is a 42 y.o. male history of prediabetes, iron deficiency, peripheral neuropathy presented with right arm swelling that began this morning.  Patient denies any history of blood clots and is not on any blood thinners however his right arm appears swollen and erythematous.  Patient dates it hurts to move his right wrist as this exacerbates the pain and when he palpates his right forearm where the redness is he gets a dull pressure pain.  Patient denied fevers, bites, trauma, weakness, decreased sensation, chest pain, shortness of breath, recent IVs, new medications  Home Medications Prior to Admission medications   Medication Sig Start Date End Date Taking? Authorizing Provider  Cholecalciferol (VITAMIN D-3) 1000 units CAPS Take by mouth.    [provider]  Methylcobalamin 1000 MCG TBDP Take by mouth.    [provider]  Multiple Vitamin (MULTIVITAMIN) tablet Take 1 tablet by mouth daily.    [provider]  TADALAFIL PO Take by mouth.    [provider]  tamsulosin (FLOMAX) 0.4 MG CAPS capsule Take 1 capsule (0.4 mg total) by mouth daily. 07/24/22   Debby Freiberg, NP      Allergies    Amoxicillin    Review of Systems   Review of Systems See HPI Physical Exam Updated Vital Signs BP 107/67   Pulse (!) 116   Temp 100.2 F (37.9 C) (Oral)   Resp (!) 25   SpO2 98%  Physical Exam Constitutional:      General: He is not in acute distress. Cardiovascular:     Rate and Rhythm: Regular rhythm. Tachycardia present.     Comments: 2+ bilateral radial pulses with increased rate Pulmonary:     Effort: Pulmonary effort is normal. No respiratory distress.     Breath sounds: Normal breath sounds.  Musculoskeletal:     Comments:  4 out of 5 right-sided grip strength No bony tenderness or abnormalities palpated Soft compartments  Skin:    Capillary Refill: Capillary refill takes less than 2 seconds.     Comments: Erythema noted on right forearm and right AC No areas of warmth noted or areas of fluctuance No vesicles noted Negative Nikolsky sign  Neurological:     Mental Status: He is alert.     Comments: Sensation intact distally     ED Results / Procedures / Treatments   Labs (all labs ordered are listed, but only abnormal results are displayed) Labs Reviewed  BASIC METABOLIC PANEL - Abnormal; Notable for the following components:      Result Value   Glucose, Bld 114 (*)    BUN 22 (*)    All other components within normal limits  CBC WITH DIFFERENTIAL/PLATELET - Abnormal; Notable for the following components:   WBC 12.1 (*)    RBC 5.98 (*)    Hemoglobin 17.2 (*)    Neutro Abs 10.9 (*)    All other components within normal limits  SARS CORONAVIRUS 2 BY RT PCR    EKG EKG Interpretation Date/Time:  Thursday January 27 2023 16:29:01 EDT Ventricular Rate:  128 PR Interval:  140 QRS Duration:  94 QT Interval:  293 QTC Calculation: 428 R Axis:   64  Text Interpretation: Sinus tachycardia Borderline T abnormalities,  inferior leads ST elevation, consider lateral injury No previous ECGs available Confirmed by Vanetta Mulders [phone]) on 01/27/2023 4:32:02 PM  Radiology US Venous Img Upper Right (DVT Study)  Result Date: 01/27/2023 CLINICAL DATA:  Right upper extremity swelling and redness EXAM: RIGHT UPPER EXTREMITY VENOUS DOPPLER ULTRASOUND TECHNIQUE: Gray-scale sonography with graded compression, as well as color Doppler and duplex ultrasound were performed to evaluate the upper extremity deep venous system from the level of the subclavian vein and including the jugular, axillary, basilic, radial, ulnar and upper cephalic vein. Spectral Doppler was utilized to evaluate flow at rest and with distal  augmentation maneuvers. COMPARISON:  None Available. FINDINGS: Contralateral Subclavian Vein: Respiratory phasicity is normal and symmetric with the symptomatic side. No evidence of thrombus. Normal compressibility. Internal Jugular Vein: No evidence of thrombus. Normal compressibility, respiratory phasicity and response to augmentation. Subclavian Vein: No evidence of thrombus. Normal compressibility, respiratory phasicity and response to augmentation. Axillary Vein: No evidence of thrombus. Normal compressibility, respiratory phasicity and response to augmentation. Cephalic Vein: No evidence of thrombus. Normal compressibility, respiratory phasicity and response to augmentation. Basilic Vein: No evidence of thrombus. Normal compressibility, respiratory phasicity and response to augmentation. Brachial Veins: No evidence of thrombus. Normal compressibility, respiratory phasicity and response to augmentation. Radial Veins: No evidence of thrombus. Normal compressibility, respiratory phasicity and response to augmentation. Ulnar Veins: No evidence of thrombus. Normal compressibility, respiratory phasicity and response to augmentation. Venous Reflux:  None visualized. Other Findings:  None visualized. IMPRESSION: No evidence of DVT within the right upper extremity. Electronically Signed   By: Jacob Moores M.D.   On: 01/27/2023 16:28    Procedures Procedures    Medications Ordered in ED Medications  sodium chloride 0.9 % bolus 1,000 mL (0 mLs Intravenous Stopped 01/27/23 1743)  acetaminophen (TYLENOL) tablet 650 mg (650 mg Oral Given 01/27/23 1641)  ibuprofen (ADVIL) tablet 800 mg (800 mg Oral Given 01/27/23 1754)  sodium chloride 0.9 % bolus 1,000 mL (1,000 mLs Intravenous New Bag/Given 01/27/23 1754)    ED Course/ Medical Decision Making/ A&P Clinical Course as of 01/27/23 1854  Thu Jan 27, 2023  1850 Persistent tachycardia, doxycycline 7 days BID, DC if tachycardia resolves.  [CG]  1851 No CP, no SOB.  [CG]    Clinical Course User Index [CG] Al Decant, PA-C                             Medical Decision Making Amount and/or Complexity of Data Reviewed Labs: ordered.   [name] 42 y.o. presented today for right arm swelling. Working DDx that I considered at this time includes, but not limited to, DVT, cellulitis, abscess, medication induced cellulitis, SJS/TEN, dress syndrome.  R/o DDx: abscess, medication induced cellulitis, SJS/TEN, dress syndrome., DVT: These are considered less likely due to history of present illness and physical exam findings  Review of prior external notes: 07/24/2022 ED provider  Unique Tests and My Interpretation:  CBC: Mild leukocytosis 12.1 BMP: Unremarkable Right upper extremity U/S: Unremarkable COVID: Negative  Discussion with Independent Historian:  wife  Discussion of Management of Tests: None  Risk: Medium: prescription drug management  Risk Stratification Score: none  Staffed with Zackowski, MD  Plan: On exam patient was in no acute distress however his heart rate was 132 in the room.  Patient was tender in his right forearm and there is noted to be erythema as well.  This area is not warm or  fluctuant.  Right arm does appear edematous to left arm and so an ultrasound is ordered to eval for possible blood clot.  Basic labs are drawn and patient was given fluids.  Patient stable at this time.  Patient's ultrasound came back unremarkable.  The nurse to recheck patient's temperature and patient's temp was 100.6 F.  Patient is still tachycardic at 127 and will be given fluids.  I suspect patient may have possible cellulitis as he does have erythema of his right forearm and now febrile.  Patient will be given Tylenol.  Anticipate discharge on doxycycline with primary care follow-up if vitals can become reasonable.  On recheck patient was still tachycardic at 120.  Patient does not endorse any chest pain or shortness of breath at  this time but states that he hurts in his right arm if he moves his right arm.  Patient's temperatures came down after receiving the Tylenol and ibuprofen.  Patient was given second liter of fluids and will be reevaluated.  Patient signed out to Jannifer Hick, PA-C. Plan being to evaluate patient after second liter of fluids to see if his HR has come down and discharge on doxy for suspected cellulitis. If not considered further workup.         Final Clinical Impression(s) / ED Diagnoses Final diagnoses:  Cellulitis of right upper extremity    Rx / DC Orders ED Discharge Orders     None         Joe Ray 01/27/23 1854    Vanetta Mulders, MD 01/28/23 2109

## 2023-01-27 NOTE — ED Triage Notes (Signed)
Pt c/o swelling/ redness to RUE onset 0630. Denies any known bites, trauma. Concern for blood clot or infection. States he was seen at [facility] for same, advised to come to ED to rule out clot/ infection

## 2023-01-27 NOTE — Discharge Instructions (Addendum)
Return to the ED with any new or worsening signs or symptoms Please begin taking doxycycline twice daily for the next 7 days.  You received your first dose here tonight. Please read the attached guide concerning cellulitis

## 2023-01-27 NOTE — ED Notes (Addendum)
Venous Doppler US at [facility]

## 2023-01-27 NOTE — ED Provider Notes (Signed)
  Physical Exam  BP 105/77   Pulse (!) 111   Temp 100.2 F (37.9 C) (Oral)   Resp (!) 22   SpO2 96%   Physical Exam Vitals and nursing note reviewed.  Constitutional:      General: He is not in acute distress.    Appearance: He is not toxic-appearing.  HENT:     Head: Normocephalic and atraumatic.  Pulmonary:     Effort: No respiratory distress.  Skin:    Coloration: Skin is not jaundiced or pale.  Neurological:     Mental Status: He is alert and oriented to person, place, and time.  Psychiatric:        Behavior: Behavior normal.     Procedures  Procedures  ED Course / MDM   Clinical Course as of 01/27/23 2105  Thu Jan 27, 2023  1850 Persistent tachycardia, doxycycline 7 days BID, DC if tachycardia resolves.  [CG]  1851 No CP, no SOB. [CG]    Clinical Course User Index [CG] Al Decant, PA-C   Medical Decision Making Amount and/or Complexity of Data Reviewed Labs: ordered.  Risk OTC drugs. Prescription drug management.   42 year old male presents to ED for evaluation.  Please see HPI for further details.  In short, this is a 42 year old male signed out to me pending second liter of fluid to improve tachycardia.  Patient presented due to concern of either blood clot versus infection to the right upper extremity.  Second liter administered, patient still tachycardic however blood pressure MAP has been reassuring.  Patient does have low-grade fever here.  Tachycardia could be secondary to viral illness.  He has received 2 L of fluid and his tachycardia persists.  He states he does not have any chest pain or shortness of breath.  He reports he feels like he wants to go home and he feels better.  Per previous provider plan, the patient be placed on doxycycline for 7 days which she will take twice daily.  He will follow-up with his PCP.  He will return to the ED with any new or worsening signs or symptoms.  Discussed this patient with my attending Dr. Deretha Emory  voices agreement plan of management.  Patient discharged in stable condition.       Al Decant, PA-C 01/27/23 2105    Vanetta Mulders, MD 01/28/23 2110

## 2023-01-29 ENCOUNTER — Inpatient Hospital Stay (HOSPITAL_BASED_OUTPATIENT_CLINIC_OR_DEPARTMENT_OTHER)
Admission: EM | Admit: 2023-01-29 | Discharge: 2023-02-03 | DRG: 555 | Disposition: A | Discharge status: Other Acute Inpatient Hospital | Payer: Commercial Managed Care - PPO | Attending: Internal Medicine | Admitting: Internal Medicine

## 2023-01-29 ENCOUNTER — Emergency Department (HOSPITAL_BASED_OUTPATIENT_CLINIC_OR_DEPARTMENT_OTHER): Payer: Commercial Managed Care - PPO

## 2023-01-29 ENCOUNTER — Encounter (HOSPITAL_BASED_OUTPATIENT_CLINIC_OR_DEPARTMENT_OTHER): Payer: Self-pay

## 2023-01-29 ENCOUNTER — Other Ambulatory Visit: Payer: Self-pay

## 2023-01-29 DIAGNOSIS — R238 Other skin changes: Secondary | ICD-10-CM | POA: Diagnosis present

## 2023-01-29 DIAGNOSIS — R3915 Urgency of urination: Secondary | ICD-10-CM | POA: Diagnosis not present

## 2023-01-29 DIAGNOSIS — Z1152 Encounter for screening for COVID-19: Secondary | ICD-10-CM

## 2023-01-29 DIAGNOSIS — N529 Male erectile dysfunction, unspecified: Secondary | ICD-10-CM | POA: Diagnosis present

## 2023-01-29 DIAGNOSIS — J811 Chronic pulmonary edema: Secondary | ICD-10-CM | POA: Diagnosis present

## 2023-01-29 DIAGNOSIS — I959 Hypotension, unspecified: Secondary | ICD-10-CM | POA: Diagnosis present

## 2023-01-29 DIAGNOSIS — G629 Polyneuropathy, unspecified: Secondary | ICD-10-CM | POA: Diagnosis not present

## 2023-01-29 DIAGNOSIS — R6511 Systemic inflammatory response syndrome (SIRS) of non-infectious origin with acute organ dysfunction: Secondary | ICD-10-CM | POA: Diagnosis present

## 2023-01-29 DIAGNOSIS — A419 Sepsis, unspecified organism: Principal | ICD-10-CM | POA: Diagnosis present

## 2023-01-29 DIAGNOSIS — E876 Hypokalemia: Secondary | ICD-10-CM | POA: Diagnosis present

## 2023-01-29 DIAGNOSIS — Z22322 Carrier or suspected carrier of Methicillin resistant Staphylococcus aureus: Secondary | ICD-10-CM

## 2023-01-29 DIAGNOSIS — Z833 Family history of diabetes mellitus: Secondary | ICD-10-CM

## 2023-01-29 DIAGNOSIS — R7303 Prediabetes: Secondary | ICD-10-CM | POA: Diagnosis present

## 2023-01-29 DIAGNOSIS — M790 Rheumatism, unspecified: Principal | ICD-10-CM | POA: Diagnosis present

## 2023-01-29 DIAGNOSIS — Z881 Allergy status to other antibiotic agents status: Secondary | ICD-10-CM

## 2023-01-29 DIAGNOSIS — K120 Recurrent oral aphthae: Secondary | ICD-10-CM | POA: Diagnosis not present

## 2023-01-29 DIAGNOSIS — R509 Fever, unspecified: Secondary | ICD-10-CM

## 2023-01-29 DIAGNOSIS — R6520 Severe sepsis without septic shock: Secondary | ICD-10-CM | POA: Diagnosis present

## 2023-01-29 DIAGNOSIS — R000 Tachycardia, unspecified: Secondary | ICD-10-CM

## 2023-01-29 DIAGNOSIS — Z880 Allergy status to penicillin: Secondary | ICD-10-CM | POA: Diagnosis not present

## 2023-01-29 DIAGNOSIS — L299 Pruritus, unspecified: Secondary | ICD-10-CM | POA: Diagnosis not present

## 2023-01-29 DIAGNOSIS — R7982 Elevated C-reactive protein (CRP): Secondary | ICD-10-CM | POA: Diagnosis not present

## 2023-01-29 DIAGNOSIS — L03113 Cellulitis of right upper limb: Secondary | ICD-10-CM | POA: Diagnosis present

## 2023-01-29 DIAGNOSIS — Z79899 Other long term (current) drug therapy: Secondary | ICD-10-CM

## 2023-01-29 DIAGNOSIS — K146 Glossodynia: Secondary | ICD-10-CM | POA: Diagnosis present

## 2023-01-29 DIAGNOSIS — R21 Rash and other nonspecific skin eruption: Secondary | ICD-10-CM

## 2023-01-29 DIAGNOSIS — E611 Iron deficiency: Secondary | ICD-10-CM

## 2023-01-29 DIAGNOSIS — Z8614 Personal history of Methicillin resistant Staphylococcus aureus infection: Secondary | ICD-10-CM | POA: Diagnosis not present

## 2023-01-29 DIAGNOSIS — M791 Myalgia, unspecified site: Secondary | ICD-10-CM

## 2023-01-29 LAB — CBC WITH DIFFERENTIAL/PLATELET
Abs Immature Granulocytes: 0.06 10*3/uL (ref 0.00–0.07)
Basophils Absolute: 0 10*3/uL (ref 0.0–0.1)
Basophils Relative: 0 %
Eosinophils Absolute: 0 10*3/uL (ref 0.0–0.5)
Eosinophils Relative: 0 %
HCT: 41 % (ref 39.0–52.0)
Hemoglobin: 14.4 g/dL (ref 13.0–17.0)
Immature Granulocytes: 0 %
Lymphocytes Relative: 13 %
Lymphs Abs: 1.8 10*3/uL (ref 0.7–4.0)
MCH: 29.4 pg (ref 26.0–34.0)
MCHC: 35.1 g/dL (ref 30.0–36.0)
MCV: 83.7 fL (ref 80.0–100.0)
Monocytes Absolute: 0.4 10*3/uL (ref 0.1–1.0)
Monocytes Relative: 3 %
Neutro Abs: 11.1 10*3/uL — ABNORMAL HIGH (ref 1.7–7.7)
Neutrophils Relative %: 84 %
Platelets: 291 10*3/uL (ref 150–400)
RBC: 4.9 MIL/uL (ref 4.22–5.81)
RDW: 12.7 % (ref 11.5–15.5)
WBC: 13.3 10*3/uL — ABNORMAL HIGH (ref 4.0–10.5)
nRBC: 0 % (ref 0.0–0.2)

## 2023-01-29 LAB — URINALYSIS, W/ REFLEX TO CULTURE (INFECTION SUSPECTED)
Bacteria, UA: NONE SEEN
Bilirubin Urine: NEGATIVE
Glucose, UA: NEGATIVE mg/dL
Hgb urine dipstick: NEGATIVE
Ketones, ur: 15 mg/dL — AB
Leukocytes,Ua: NEGATIVE
Nitrite: NEGATIVE
Specific Gravity, Urine: 1.026 (ref 1.005–1.030)
pH: 5.5 (ref 5.0–8.0)

## 2023-01-29 LAB — LACTIC ACID, PLASMA
Lactic Acid, Venous: 1.9 mmol/L (ref 0.5–1.9)
Lactic Acid, Venous: 1.2 mmol/L (ref 0.5–1.9)

## 2023-01-29 LAB — COMPREHENSIVE METABOLIC PANEL
ALT: 20 U/L (ref 0–44)
AST: 11 U/L — ABNORMAL LOW (ref 15–41)
Albumin: 3.5 g/dL (ref 3.5–5.0)
Alkaline Phosphatase: 57 U/L (ref 38–126)
Anion gap: 9 (ref 5–15)
BUN: 20 mg/dL (ref 6–20)
CO2: 24 mmol/L (ref 22–32)
Calcium: 9 mg/dL (ref 8.9–10.3)
Chloride: 103 mmol/L (ref 98–111)
Creatinine, Ser: 0.78 mg/dL (ref 0.61–1.24)
GFR, Estimated: >60 mL/min (ref >60)
Glucose, Bld: 130 mg/dL — ABNORMAL HIGH (ref 70–99)
Potassium: 3.5 mmol/L (ref 3.5–5.1)
Sodium: 136 mmol/L (ref 135–145)
Total Bilirubin: 0.5 mg/dL (ref 0.3–1.2)
Total Protein: 6 g/dL — ABNORMAL LOW (ref 6.5–8.1)

## 2023-01-29 LAB — CULTURE, BLOOD (ROUTINE X 2)

## 2023-01-29 LAB — C-REACTIVE PROTEIN: CRP: 20.8 mg/dL — ABNORMAL HIGH (ref <1.0)

## 2023-01-29 LAB — SEDIMENTATION RATE: Sed Rate: 28 mm/hr — ABNORMAL HIGH (ref 0–16)

## 2023-01-29 MED ORDER — VANCOMYCIN HCL IN DEXTROSE 1-5 GM/200ML-% IV SOLN
1000.0000 mg | Freq: Once | INTRAVENOUS | Status: AC
  Administered 2023-01-29: 1000 mg via INTRAVENOUS
  Filled 2023-01-29: qty 200

## 2023-01-29 MED ORDER — LACTATED RINGERS IV BOLUS
1000.0000 mL | Freq: Once | INTRAVENOUS | Status: AC
  Administered 2023-01-29: 1000 mL via INTRAVENOUS

## 2023-01-29 MED ORDER — KETOROLAC TROMETHAMINE 30 MG/ML IJ SOLN
30.0000 mg | Freq: Once | INTRAMUSCULAR | Status: AC
  Administered 2023-01-29: 30 mg via INTRAVENOUS
  Filled 2023-01-29: qty 1

## 2023-01-29 MED ORDER — SODIUM CHLORIDE 0.9 % IV SOLN
2.0000 g | Freq: Three times a day (TID) | INTRAVENOUS | Status: DC
  Administered 2023-01-29 – 2023-01-31 (×7): 2 g via INTRAVENOUS
  Filled 2023-01-29 (×7): qty 12.5

## 2023-01-29 MED ORDER — ACETAMINOPHEN 325 MG PO TABS
650.0000 mg | ORAL_TABLET | Freq: Once | ORAL | Status: AC
  Administered 2023-01-29: 650 mg via ORAL
  Filled 2023-01-29: qty 2

## 2023-01-29 MED ORDER — SODIUM CHLORIDE 0.9 % IV SOLN: 2.0000 g | Freq: Once | INTRAVENOUS | Status: DC

## 2023-01-29 MED ORDER — LACTATED RINGERS IV SOLN: INTRAVENOUS | Status: DC

## 2023-01-29 NOTE — ED Triage Notes (Signed)
Pt states he has bilateral arm swelling and redness. Pt states he cannot sleep due to pain and he has had itching as well. Pt has blistering on right arm, as well as new rash to chest. Pt has been taking doxycycline as prescribed- "taken 4 pills so far". No relief with benadryl

## 2023-01-29 NOTE — ED Notes (Signed)
Called [facility] 

## 2023-01-29 NOTE — ED Notes (Addendum)
Contacted [facility]; will be reaching out to [employer], DO

## 2023-01-29 NOTE — ED Notes (Signed)
Blood cultures drawn before start of antibiotics  

## 2023-01-29 NOTE — ED Notes (Signed)
Contacted [facility] for tx; forwarded call to Virgina Norfolk, DO

## 2023-01-29 NOTE — ED Provider Notes (Signed)
[place] EMERGENCY DEPARTMENT AT [facility] Provider Note   CSN: [id number] Arrival date & time: 01/29/23  1022     History  Chief Complaint  Patient presents with   Rash    Joe Ray is a 42 y.o. male.  Patient here with ongoing rash and fever and bodyaches and joint ache and pains.  He was started on doxycycline yesterday for cellulitis.  Has history of neuropathy in his legs and has extensive rheumatology workup in the past has been unremarkable.  But he has still been having refer to rheumatology at [facility] now.  He denies any recent tick exposures or camping or water exposure or new medications otherwise.  He is having now rash over his knees and pain in his knees and shoulder joints.  He denies any headache or neck pain.  He has a rash now over his chest wall and upper back as well.  Denies any nausea vomiting or diarrhea.  No abdominal pain.  Nothing is making it worse or better.  He is fairly itchy as well.  He denies any alcohol or drug use or IV drug use.  He was diagnosed with neuropathy in his legs for years ago but never found the cause.  The history is provided by the patient.       Home Medications Prior to Admission medications   Medication Sig Start Date End Date Taking? Authorizing Provider  Cholecalciferol (VITAMIN D-3) 1000 units CAPS Take by mouth.    [provider]  doxycycline (VIBRAMYCIN) 100 MG capsule Take 1 capsule (100 mg total) by mouth 2 (two) times daily. 01/27/23   Al Decant, PA-C  Methylcobalamin 1000 MCG TBDP Take by mouth.    [provider]  Multiple Vitamin (MULTIVITAMIN) tablet Take 1 tablet by mouth daily.    [provider]  TADALAFIL PO Take by mouth.    [provider]  tamsulosin (FLOMAX) 0.4 MG CAPS capsule Take 1 capsule (0.4 mg total) by mouth daily. 07/24/22   Debby Freiberg, NP      Allergies    Amoxicillin    Review of Systems   Review of Systems  Physical  Exam Updated Vital Signs BP 104/64 (BP Location: Right Arm)   Pulse (!) 114   Temp 99.9 F (37.7 C) (Oral)   Resp 18   Ht 5\' 8"  (1.727 m)   Wt 75.3 kg   SpO2 97%   BMI 25.24 kg/m  Physical Exam Vitals and nursing note reviewed.  Constitutional:      General: He is not in acute distress.    Appearance: He is well-developed. He is not ill-appearing.  HENT:     Head: Normocephalic and atraumatic.  Eyes:     Extraocular Movements: Extraocular movements intact.     Conjunctiva/sclera: Conjunctivae normal.     Pupils: Pupils are equal, round, and reactive to light.  Cardiovascular:     Rate and Rhythm: Normal rate and regular rhythm.     Pulses: Normal pulses.     Heart sounds: Normal heart sounds. No murmur heard. Pulmonary:     Effort: Pulmonary effort is normal. No respiratory distress.     Breath sounds: Normal breath sounds.  Abdominal:     Palpations: Abdomen is soft.     Tenderness: There is no abdominal tenderness.  Musculoskeletal:        General: No swelling.     Cervical back: Normal range of motion and neck supple. No rigidity.  Skin:    General: Skin is warm and dry.     Capillary Refill: Capillary refill takes less than 2 seconds.     Findings: Rash present.     Comments: He is got some patchy redness to his knees, right arm and left arm, over the anterior chest wall in the neck fold and in the upper back, there is no petechia, there is no mucosal involvement, there is no sloughing off his skin  Neurological:     General: No focal deficit present.     Mental Status: He is alert and oriented to person, place, and time.     Cranial Nerves: No cranial nerve deficit.     Sensory: No sensory deficit.     Motor: No weakness.     Coordination: Coordination normal.     Comments: 5+ out of 5 strength throughout, normal sensation, no drift, normal speech  Psychiatric:        Mood and Affect: Mood normal.     ED Results / Procedures / Treatments   Labs (all labs  ordered are listed, but only abnormal results are displayed) Labs Reviewed  CBC WITH DIFFERENTIAL/PLATELET - Abnormal; Notable for the following components:      Result Value   WBC 13.3 (*)    Neutro Abs 11.1 (*)    All other components within normal limits  COMPREHENSIVE METABOLIC PANEL - Abnormal; Notable for the following components:   Glucose, Bld 130 (*)    Total Protein 6.0 (*)    AST 11 (*)    All other components within normal limits  URINALYSIS, W/ REFLEX TO CULTURE (INFECTION SUSPECTED) - Abnormal; Notable for the following components:   Ketones, ur 15 (*)    Protein, ur TRACE (*)    All other components within normal limits  SEDIMENTATION RATE - Abnormal; Notable for the following components:   Sed Rate 28 (*)    All other components within normal limits  CULTURE, BLOOD (ROUTINE X 2)  CULTURE, BLOOD (ROUTINE X 2)  LACTIC ACID, PLASMA  LACTIC ACID, PLASMA  C-REACTIVE PROTEIN  ANTINUCLEAR ANTIBODIES, IFA    EKG None  Radiology DG Chest Portable 1 View  Result Date: 01/29/2023 CLINICAL DATA:  Fever.  Arm swelling. EXAM: PORTABLE CHEST 1 VIEW COMPARISON:  11/14/2019 FINDINGS: The cardiomediastinal contours are normal. The lungs are clear. Pulmonary vasculature is normal. No consolidation, pleural effusion, or pneumothorax. No acute osseous abnormalities are seen. IMPRESSION: No acute findings. Electronically Signed   By: Narda Rutherford M.D.   On: 01/29/2023 16:14    Procedures Procedures    Medications Ordered in ED Medications  ceFEPIme (MAXIPIME) 2 g in sodium chloride 0.9 % 100 mL IVPB (0 g Intravenous Stopped 01/29/23 1711)  lactated ringers infusion ( Intravenous New Bag/Given 01/29/23 1707)  lactated ringers bolus 1,000 mL (0 mLs Intravenous Stopped 01/29/23 1632)  vancomycin (VANCOCIN) IVPB 1000 mg/200 mL premix (0 mg Intravenous Stopped 01/29/23 1632)  acetaminophen (TYLENOL) tablet 650 mg (650 mg Oral Given 01/29/23 1702)  ketorolac (TORADOL) 30 MG/ML injection 30  mg (30 mg Intravenous Given 01/29/23 1903)    ED Course/ Medical Decision Making/ A&P                             Medical Decision Making Amount and/or Complexity of Data Reviewed Labs: ordered. Radiology: ordered.  Risk OTC drugs. Prescription drug management. Decision regarding hospitalization.   Joe Ray is here  with fever, body aches and chills and rash.  He arrives tachycardic, febrile with me in the rheumatology 0.4.  Blood pressure is normal.  He was started on doxycycline for right arm cellulitis but now rash has gone to both knees, left arm, chest wall, upper back.  This is a not petechial rash, there is no mucosal involvement, there is no sloughing off his skin.  Overall nonspecific looking rash that makes me think of more of a rheumatological process.  Sepsis workup was initiated with broad-spectrum IV antibiotics.  Will add ESR, CRP, ANA.  He has a history of neuropathy in his legs that is still not known why.  He had extensive rheumatological workup a few years ago that I can review that was unremarkable.  First time he is ever had a fever and a rash however.  He is not having any neck pain or headache.  Have no concern for meningitis.  Overall will get labs and talk with [facility] about hospitalist admission there so he can have rheumatologic Co. workup, ID workup and Derm workup.  Overall [facility] and [facility] have also been contacted.  They are not willing to accept patient him for wait list.  Right now I have talked with Dr. Julian Reil with hospitalist service.  Will send him to [facility] so he can be managed by the hospitalist team in the ED while he awaits bed at [facility].  Important note, ED provider at [facility] will need to call back to [facility] to get him back on the wait list when he arrives.   This chart was dictated using voice recognition software.  Despite best efforts to proofread,  errors can occur which can change the documentation meaning.         Final  Clinical Impression(s) / ED Diagnoses Final diagnoses:  Fever, unspecified fever cause  Rash    Rx / DC Orders ED Discharge Orders     None         Virgina Norfolk, DO 01/29/23 2011

## 2023-01-29 NOTE — ED Notes (Signed)
[facility] at bedside 

## 2023-01-29 NOTE — ED Notes (Signed)
Contacted [facility], asked Thelma Barge to reach out a second attempt for Joe Norfolk, DO

## 2023-01-29 NOTE — ED Provider Notes (Signed)
10:02 PM Discussed with [facility] line.  Discussed with the transfer center and his location was updated and he remains on the wait list.  I will also consult the hospitalist service to help manage the patient while he is in the emergency department.  10:04 PM [facility] updated that he is indeed on the waitlist. Unfortunately there are 30+ ED patients waiting on beds as well as other potential transfers so it could be some time (likely not before Monday) for transfer.   10:49 PM Discussed with Dr. Loney Loh, who will consult.    Pricilla Loveless, MD 01/29/23 2251

## 2023-01-30 ENCOUNTER — Emergency Department (HOSPITAL_COMMUNITY): Payer: Commercial Managed Care - PPO

## 2023-01-30 ENCOUNTER — Inpatient Hospital Stay (HOSPITAL_COMMUNITY): Payer: Commercial Managed Care - PPO

## 2023-01-30 DIAGNOSIS — Z79899 Other long term (current) drug therapy: Secondary | ICD-10-CM | POA: Diagnosis not present

## 2023-01-30 DIAGNOSIS — M791 Myalgia, unspecified site: Secondary | ICD-10-CM

## 2023-01-30 DIAGNOSIS — I959 Hypotension, unspecified: Secondary | ICD-10-CM

## 2023-01-30 DIAGNOSIS — R509 Fever, unspecified: Secondary | ICD-10-CM | POA: Insufficient documentation

## 2023-01-30 DIAGNOSIS — G629 Polyneuropathy, unspecified: Secondary | ICD-10-CM | POA: Diagnosis present

## 2023-01-30 DIAGNOSIS — Z880 Allergy status to penicillin: Secondary | ICD-10-CM | POA: Diagnosis not present

## 2023-01-30 DIAGNOSIS — Z22322 Carrier or suspected carrier of Methicillin resistant Staphylococcus aureus: Secondary | ICD-10-CM | POA: Diagnosis not present

## 2023-01-30 DIAGNOSIS — Z1152 Encounter for screening for COVID-19: Secondary | ICD-10-CM | POA: Diagnosis not present

## 2023-01-30 DIAGNOSIS — Z881 Allergy status to other antibiotic agents status: Secondary | ICD-10-CM | POA: Diagnosis not present

## 2023-01-30 DIAGNOSIS — R531 Weakness: Secondary | ICD-10-CM | POA: Diagnosis not present

## 2023-01-30 DIAGNOSIS — R7982 Elevated C-reactive protein (CRP): Secondary | ICD-10-CM | POA: Diagnosis present

## 2023-01-30 DIAGNOSIS — M790 Rheumatism, unspecified: Secondary | ICD-10-CM | POA: Diagnosis present

## 2023-01-30 DIAGNOSIS — Z833 Family history of diabetes mellitus: Secondary | ICD-10-CM | POA: Diagnosis not present

## 2023-01-30 DIAGNOSIS — K146 Glossodynia: Secondary | ICD-10-CM | POA: Diagnosis present

## 2023-01-30 DIAGNOSIS — L299 Pruritus, unspecified: Secondary | ICD-10-CM | POA: Diagnosis present

## 2023-01-30 DIAGNOSIS — A419 Sepsis, unspecified organism: Secondary | ICD-10-CM | POA: Diagnosis present

## 2023-01-30 DIAGNOSIS — R238 Other skin changes: Secondary | ICD-10-CM | POA: Diagnosis present

## 2023-01-30 DIAGNOSIS — E876 Hypokalemia: Secondary | ICD-10-CM | POA: Diagnosis present

## 2023-01-30 DIAGNOSIS — R7303 Prediabetes: Secondary | ICD-10-CM | POA: Diagnosis present

## 2023-01-30 DIAGNOSIS — Z8614 Personal history of Methicillin resistant Staphylococcus aureus infection: Secondary | ICD-10-CM

## 2023-01-30 DIAGNOSIS — R3915 Urgency of urination: Secondary | ICD-10-CM | POA: Diagnosis not present

## 2023-01-30 DIAGNOSIS — N529 Male erectile dysfunction, unspecified: Secondary | ICD-10-CM | POA: Diagnosis present

## 2023-01-30 DIAGNOSIS — R6511 Systemic inflammatory response syndrome (SIRS) of non-infectious origin with acute organ dysfunction: Secondary | ICD-10-CM | POA: Diagnosis present

## 2023-01-30 DIAGNOSIS — R21 Rash and other nonspecific skin eruption: Secondary | ICD-10-CM

## 2023-01-30 DIAGNOSIS — J811 Chronic pulmonary edema: Secondary | ICD-10-CM | POA: Diagnosis present

## 2023-01-30 DIAGNOSIS — R000 Tachycardia, unspecified: Secondary | ICD-10-CM

## 2023-01-30 DIAGNOSIS — K120 Recurrent oral aphthae: Secondary | ICD-10-CM | POA: Diagnosis present

## 2023-01-30 LAB — PROCALCITONIN: Procalcitonin: 1.85 ng/mL

## 2023-01-30 LAB — CBC
HCT: 39.3 % (ref 39.0–52.0)
Hemoglobin: 13.1 g/dL (ref 13.0–17.0)
MCH: 28.1 pg (ref 26.0–34.0)
MCHC: 33.3 g/dL (ref 30.0–36.0)
MCV: 84.2 fL (ref 80.0–100.0)
Platelets: 277 10*3/uL (ref 150–400)
RBC: 4.67 MIL/uL (ref 4.22–5.81)
RDW: 12.7 % (ref 11.5–15.5)
WBC: 10.6 10*3/uL — ABNORMAL HIGH (ref 4.0–10.5)
nRBC: 0 % (ref 0.0–0.2)

## 2023-01-30 LAB — COMPREHENSIVE METABOLIC PANEL
ALT: 16 U/L (ref 0–44)
AST: 13 U/L — ABNORMAL LOW (ref 15–41)
Albumin: 2 g/dL — ABNORMAL LOW (ref 3.5–5.0)
Alkaline Phosphatase: 41 U/L (ref 38–126)
Anion gap: 8 (ref 5–15)
BUN: 14 mg/dL (ref 6–20)
CO2: 21 mmol/L — ABNORMAL LOW (ref 22–32)
Calcium: 7.1 mg/dL — ABNORMAL LOW (ref 8.9–10.3)
Chloride: 108 mmol/L (ref 98–111)
Creatinine, Ser: 0.79 mg/dL (ref 0.61–1.24)
GFR, Estimated: >60 mL/min (ref >60)
Glucose, Bld: 100 mg/dL — ABNORMAL HIGH (ref 70–99)
Potassium: 3.2 mmol/L — ABNORMAL LOW (ref 3.5–5.1)
Sodium: 137 mmol/L (ref 135–145)
Total Bilirubin: 0.6 mg/dL (ref 0.3–1.2)
Total Protein: 4.2 g/dL — ABNORMAL LOW (ref 6.5–8.1)

## 2023-01-30 LAB — HEMOGLOBIN A1C
Hgb A1c MFr Bld: 5 % (ref 4.8–5.6)
Mean Plasma Glucose: 96.8 mg/dL

## 2023-01-30 LAB — HIV ANTIBODY (ROUTINE TESTING W REFLEX): HIV Screen 4th Generation wRfx: NONREACTIVE

## 2023-01-30 LAB — CORTISOL: Cortisol, Plasma: 14.1 ug/dL

## 2023-01-30 LAB — CULTURE, BLOOD (ROUTINE X 2): Culture: NO GROWTH

## 2023-01-30 LAB — MAGNESIUM: Magnesium: 1.3 mg/dL — ABNORMAL LOW (ref 1.7–2.4)

## 2023-01-30 LAB — PHOSPHORUS: Phosphorus: 1.6 mg/dL — ABNORMAL LOW (ref 2.5–4.6)

## 2023-01-30 LAB — LACTIC ACID, PLASMA
Lactic Acid, Venous: 1.1 mmol/L (ref 0.5–1.9)
Lactic Acid, Venous: 1.1 mmol/L (ref 0.5–1.9)

## 2023-01-30 MED ORDER — SODIUM CHLORIDE 0.9 % IV BOLUS
2000.0000 mL | Freq: Once | INTRAVENOUS | Status: AC
  Administered 2023-01-30: 2000 mL via INTRAVENOUS

## 2023-01-30 MED ORDER — ONDANSETRON HCL 4 MG/2ML IJ SOLN: 4.0000 mg | Freq: Four times a day (QID) | INTRAMUSCULAR | Status: DC | PRN

## 2023-01-30 MED ORDER — ONDANSETRON HCL 4 MG PO TABS: 4.0000 mg | ORAL_TABLET | Freq: Four times a day (QID) | ORAL | Status: DC | PRN

## 2023-01-30 MED ORDER — SODIUM CHLORIDE 0.9 % IV SOLN
1000.0000 mL | INTRAVENOUS | Status: DC
  Administered 2023-01-30: 1000 mL via INTRAVENOUS

## 2023-01-30 MED ORDER — SODIUM CHLORIDE 0.9 % IV BOLUS
1000.0000 mL | Freq: Once | INTRAVENOUS | Status: AC
  Administered 2023-01-30: 1000 mL via INTRAVENOUS

## 2023-01-30 MED ORDER — SODIUM CHLORIDE 0.9 % IV BOLUS (SEPSIS)
1000.0000 mL | Freq: Once | INTRAVENOUS | Status: AC
  Administered 2023-01-30: 1000 mL via INTRAVENOUS

## 2023-01-30 MED ORDER — ACETAMINOPHEN 325 MG PO TABS
650.0000 mg | ORAL_TABLET | Freq: Four times a day (QID) | ORAL | Status: DC | PRN
  Administered 2023-01-30 – 2023-02-03 (×13): 650 mg via ORAL
  Filled 2023-01-30 (×13): qty 2

## 2023-01-30 MED ORDER — VANCOMYCIN HCL 1250 MG/250ML IV SOLN
1250.0000 mg | Freq: Two times a day (BID) | INTRAVENOUS | Status: DC
  Administered 2023-01-30 – 2023-01-31 (×3): 1250 mg via INTRAVENOUS
  Filled 2023-01-30 (×5): qty 250

## 2023-01-30 MED ORDER — MORPHINE SULFATE (PF) 4 MG/ML IV SOLN: 4.0000 mg | INTRAVENOUS | Status: DC | PRN

## 2023-01-30 MED ORDER — FUROSEMIDE 10 MG/ML IJ SOLN
40.0000 mg | Freq: Once | INTRAMUSCULAR | Status: AC
  Administered 2023-01-30: 40 mg via INTRAVENOUS
  Filled 2023-01-30: qty 4

## 2023-01-30 MED ORDER — IOHEXOL 350 MG/ML SOLN
75.0000 mL | Freq: Once | INTRAVENOUS | Status: AC | PRN
  Administered 2023-01-30: 75 mL via INTRAVENOUS

## 2023-01-30 MED ORDER — MAGNESIUM OXIDE -MG SUPPLEMENT 400 (240 MG) MG PO TABS
400.0000 mg | ORAL_TABLET | Freq: Two times a day (BID) | ORAL | Status: DC
  Administered 2023-01-30: 400 mg via ORAL
  Filled 2023-01-30: qty 1

## 2023-01-30 MED ORDER — MAGNESIUM SULFATE 2 GM/50ML IV SOLN
2.0000 g | Freq: Once | INTRAVENOUS | Status: AC
  Administered 2023-01-30: 2 g via INTRAVENOUS
  Filled 2023-01-30: qty 50

## 2023-01-30 MED ORDER — DIPHENHYDRAMINE HCL 25 MG PO CAPS
50.0000 mg | ORAL_CAPSULE | Freq: Four times a day (QID) | ORAL | Status: DC | PRN
  Administered 2023-01-30 – 2023-01-31 (×5): 50 mg via ORAL
  Filled 2023-01-30 (×5): qty 2

## 2023-01-30 MED ORDER — POTASSIUM CHLORIDE CRYS ER 20 MEQ PO TBCR
20.0000 meq | EXTENDED_RELEASE_TABLET | Freq: Two times a day (BID) | ORAL | Status: DC
  Administered 2023-01-30 – 2023-01-31 (×2): 20 meq via ORAL
  Filled 2023-01-30 (×2): qty 1

## 2023-01-30 MED ORDER — POTASSIUM PHOSPHATES 15 MMOLE/5ML IV SOLN
30.0000 mmol | Freq: Once | INTRAVENOUS | Status: AC
  Administered 2023-01-30: 30 mmol via INTRAVENOUS
  Filled 2023-01-30: qty 10

## 2023-01-30 MED ORDER — ENOXAPARIN SODIUM 40 MG/0.4ML IJ SOSY
40.0000 mg | PREFILLED_SYRINGE | INTRAMUSCULAR | Status: DC
  Administered 2023-01-30 – 2023-02-03 (×5): 40 mg via SUBCUTANEOUS
  Filled 2023-01-30 (×5): qty 0.4

## 2023-01-30 NOTE — ED Notes (Signed)
Pt alert, NAD, calm, interactive, resps e/u, speaking clearly, skin W&D. BP low, sBP 88, IVF bolus continues, "feel a little better after repositioning self to supine, HOB degrees", states, "pain decreased, hands less stiff, posterior neck blisters still present".

## 2023-01-30 NOTE — H&P (Addendum)
Patient seen and examined.  Multiple discussions with Dr. Lynelle Doctor from emergency room, CMO Dr. Lindie Spruce.  Patient remains in the emergency room with no potential to transfer out today.  Given patient's acute clinical situation, he will be best served as inpatient with multidisciplinary services and nursing care.  Will admit patient to the progressive care unit.  HPI: See details in consult note done by Dr. Loney Loh.  Patient tells me that all day Wednesday he was in his garage cleaning out a lot of dust and moving of things and woke up Thursday morning with body and erythema.  Patient himself suspect some contact irritation or dermatitis and reaction.  Rash illness/severe sepsis with hypotension: Patient with temperature 101, tachycardia, blood pressures less than 80 responding to IV fluids. Patient is currently on third liter of isotonic fluid since last 24 hours He has adequate perfusion. Lactic acid was normal.  WC count is normalized.   Plan: For patient's safety and clinical care will admit to progressive care unit.  If blood pressure does not respond or becomes hypotensive, with need vasopressor support and will talk to ICU. Appreciate infectious disease involvement. CRP is elevated, ESR is mildly elevated.  Multiple rheumatological tests are pending. Will check procalcitonin and lactic acid as well as repeat CMP and electrolytes. Continue supportive care with antibiotics, IV fluids until patient can be transferred to [facility] as previously planned. His rashes are actually fading away, he does have vesicular eruption on the back of his neck.  If new rashes arise, may benefit a skin biopsy with surgery. If patient has good clinical recovery may discharge him with follow-up with his rheumatologist Dr . Drenda Freeze home patient had seen last year and underwent extensive dermatological testing for his paresthesia and neuropathy.   Detailed discussion with patient, updates given and questions were  answered.      Additional time spent : 50 minutes   Addendum, 3/40 p.m. Patient reexamined.  His blood pressures are adequately improved.  He is now complaining of feeling short of breath.  Patient is on room air.  He has developed puffiness around the eyes and also some edema on the arm.  Since his blood pressures are adequate, will give him 1 dose of IV Lasix.  Will encourage oral intake.  Discontinue further IV fluids. CT scan chest abdomen pelvis reviewed, had developed some pulmonary edema however there is no any other abnormalities on his chest abdomen and pelvis imaging studies. Aggressive replacement of potassium, magnesium and phosphorus.

## 2023-01-30 NOTE — ED Notes (Signed)
Message to pharmacy sent regarding whereabouts of vancomycin

## 2023-01-30 NOTE — Consult Note (Signed)
Medical Consultation   Joe Ray  ZOX:[id number]  DOB: February 15, 1981  DOA: 01/29/2023  PCP: Pearline Cables, MD  Requesting physician:  Dr. Sherlie Ban  Reason for consultation: Rash   History of Present Illness: Joe Ray is an 42 y.o. male prediabetes, iron deficiency, peripheral neuropathy seen at [facility] 2 days ago for right hand/forearm swelling and erythema.  Ultrasound was negative for DVT.  He was diagnosed with cellulitis and discharged on doxycycline.  Patient returned to [facility] ED today with a new rash over his bilateral upper extremities, knees, chest wall, and upper back.  He complained of pain in his knees and shoulder joints.  Tachycardic with heart rate in the 120s and blood pressure soft with systolic in the 90s.  Afebrile.  Labs showing WBC 13.3, lactic acid normal x 2, blood cultures collected, ESR 28, CRP pending, ANA pending.  Chest x-ray showing no acute findings.  Patient received Tylenol, Toradol, vancomycin, cefepime, and 1 L LR bolus in the ED.  Patient needs transfer to a tertiary care center for evaluation by rheumatology and dermatology.  ED physician contacted [facility] and [facility], not accepting transfers.  Also contacted [facility], no beds available and patient remains on the waiting list (bed likely not available before Monday).  He was transferred to [facility] ED from [facility].  TR[facility] consulted to help with medical management in the ED until patient has a bed available at [facility].   Patient states he was exercising earlier this week on Wednesday (7/3) and was sweating a lot.  Then on Thursday he woke up with right arm pain, swelling, and a rash on his arm.  He was evaluated in the emergency room and was told he did not have a blood clot in his arm and was prescribed doxycycline which he started taking the same day.  Then on Friday he started having pain and swelling of his left arm and noticed a rash on this arm as well.  Also  started having pain in both of his wrist joints, shoulders, and knees.  He is concerned that the rash has now spread to his neck and back and lower extremities as well.  He was taking Benadryl for itching and was applying hydrocortisone cream.  Denies history of tick bite.  Patient states he has had pain in both of his feet for several years and was seen by rheumatology a year ago and had workup done and reportedly it was normal.  He was subsequently referred to neurology and was diagnosed with small fiber neuropathy.  Review of Systems:  Review of Systems  All other systems reviewed and are negative.  Past Medical History: Past Medical History:  Diagnosis Date   Allergy     Past Surgical History: History reviewed. No pertinent surgical history.   Allergies:   Allergies  Allergen Reactions   Amoxicillin Diarrhea and Rash     Social History:  reports that he has never smoked. He has never used smokeless tobacco. He reports that he does not drink alcohol and does not use drugs.   Family History: Family History  Problem Relation Age of Onset   Diabetes Father     Physical Exam: Vitals:   01/29/23 2230 01/29/23 2245 01/29/23 2300 01/30/23 0200  BP:  103/72 112/81 114/80  Pulse: 84 86 90 (!) 109  Resp: 20 14 20  (!) 21  Temp:  98.2 F (36.8 C)  TempSrc:      SpO2: 100% 100% 100% 100%  Weight:      Height:        Physical Exam Vitals reviewed.  Constitutional:      General: He is not in acute distress. HENT:     Head: Normocephalic and atraumatic.  Eyes:     Extraocular Movements: Extraocular movements intact.  Cardiovascular:     Rate and Rhythm: Regular rhythm. Tachycardia present.     Pulses: Normal pulses.  Pulmonary:     Effort: Pulmonary effort is normal. No respiratory distress.     Breath sounds: Normal breath sounds. No wheezing or rales.  Abdominal:     General: Bowel sounds are normal. There is no distension.     Palpations: Abdomen is soft.      Tenderness: There is no abdominal tenderness.  Musculoskeletal:     Cervical back: Normal range of motion. No rigidity.     Comments: Bilateral upper extremities swollen  Skin:    General: Skin is warm and dry.     Comments: Rash noted on upper chest/neck, back, bilateral upper and lower extremities.  No bullae, skin sloughing, or necrosis.  No mucosal involvement.   Neurological:     General: No focal deficit present.     Mental Status: He is alert and oriented to person, place, and time.                  Data reviewed:  I have personally reviewed following labs and imaging studies Labs:  CBC: Recent Labs  Lab 01/27/23 1531 01/29/23 1457  WBC 12.1* 13.3*  NEUTROABS 10.9* 11.1*  HGB 17.2* 14.4  HCT 50.5 41.0  MCV 84.4 83.7  PLT 283 291    Basic Metabolic Panel: Recent Labs  Lab 01/27/23 1531 01/29/23 1457  NA 137 136  K 4.1 3.5  CL 104 103  CO2 25 24  GLUCOSE 114* 130*  BUN 22* 20  CREATININE 0.81 0.78  CALCIUM 9.1 9.0   GFR Estimated Creatinine Clearance: 117.6 mL/min (by C-G formula based on SCr of 0.78 mg/dL). Liver Function Tests: Recent Labs  Lab 01/29/23 1457  AST 11*  ALT 20  ALKPHOS 57  BILITOT 0.5  PROT 6.0*  ALBUMIN 3.5   No results for input(s): "LIPASE", "AMYLASE" in the last 168 hours. No results for input(s): "AMMONIA" in the last 168 hours. Coagulation profile No results for input(s): "INR", "PROTIME" in the last 168 hours.  Cardiac Enzymes: No results for input(s): "CKTOTAL", "CKMB", "CKMBINDEX", "TROPONINI" in the last 168 hours. BNP: Invalid input(s): "POCBNP" CBG: No results for input(s): "GLUCAP" in the last 168 hours. D-Dimer No results for input(s): "DDIMER" in the last 72 hours. Hgb A1c No results for input(s): "HGBA1C" in the last 72 hours. Lipid Profile No results for input(s): "CHOL", "HDL", "LDLCALC", "TRIG", "CHOLHDL", "LDLDIRECT" in the last 72 hours. Thyroid function studies No results for input(s):  "TSH", "T4TOTAL", "T3FREE", "THYROIDAB" in the last 72 hours.  Invalid input(s): "FREET3" Anemia work up No results for input(s): "VITAMINB12", "FOLATE", "FERRITIN", "TIBC", "IRON", "RETICCTPCT" in the last 72 hours. Urinalysis    Component Value Date/Time   COLORURINE YELLOW 01/29/2023 1457   APPEARANCEUR CLEAR 01/29/2023 1457   LABSPEC 1.026 01/29/2023 1457   PHURINE 5.5 01/29/2023 1457   GLUCOSEU NEGATIVE 01/29/2023 1457   HGBUR NEGATIVE 01/29/2023 1457   BILIRUBINUR NEGATIVE 01/29/2023 1457   KETONESUR 15 (A) 01/29/2023 1457   PROTEINUR TRACE (A)  01/29/2023 1457   UROBILINOGEN 0.2 07/24/2022 1552   NITRITE NEGATIVE 01/29/2023 1457   LEUKOCYTESUR NEGATIVE 01/29/2023 1457     Microbiology Recent Results (from the past 240 hour(s))  SARS Coronavirus 2 by RT PCR (hospital order, performed in [facility] hospital lab) *cepheid single result test* Anterior Nasal Swab     Status: None   Collection Time: 01/27/23  4:42 PM   Specimen: Anterior Nasal Swab  Result Value Ref Range Status   SARS Coronavirus 2 by RT PCR NEGATIVE NEGATIVE Final    Comment: (NOTE) SARS-CoV-2 target nucleic acids are NOT DETECTED.  The SARS-CoV-2 RNA is generally detectable in upper and lower respiratory specimens during the acute phase of infection. The lowest concentration of SARS-CoV-2 viral copies this assay can detect is 250 copies / mL. A negative result does not preclude SARS-CoV-2 infection and should not be used as the sole basis for treatment or other patient management decisions.  A negative result may occur with improper specimen collection / handling, submission of specimen other than nasopharyngeal swab, presence of viral mutation(s) within the areas targeted by this assay, and inadequate number of viral copies (<250 copies / mL). A negative result must be combined with clinical observations, patient history, and epidemiological information.  Fact Sheet for Patients:    RoadLapTop.co.za  Fact Sheet for Healthcare Providers: http://kim-miller.com/  This test is not yet approved or  cleared by the [place] FDA and has been authorized for detection and/or diagnosis of SARS-CoV-2 by FDA under an Emergency Use Authorization (EUA).  This EUA will remain in effect (meaning this test can be used) for the duration of the COVID-19 declaration under Section 564(b)(1) of the Act, 21 U.S.C. section 360bbb-3(b)(1), unless the authorization is terminated or revoked sooner.  Performed at [facility], [address], [place], [place] [address]   Blood culture (routine x 2)     Status: None (Preliminary result)   Collection Time: 01/29/23  2:57 PM   Specimen: BLOOD RIGHT ARM  Result Value Ref Range Status   Specimen Description   Final    BLOOD RIGHT ARM Performed at [facility], [address] [place], [place] [address]    Special Requests   Final    BOTTLES DRAWN AEROBIC AND ANAEROBIC Blood Culture results may not be optimal due to an excessive volume of blood received in culture bottles Performed at [facility], [address] [place], [place] [address]    Culture PENDING  Incomplete   Report Status PENDING  Incomplete  Blood culture (routine x 2)     Status: None (Preliminary result)   Collection Time: 01/29/23  2:57 PM   Specimen: BLOOD LEFT ARM  Result Value Ref Range Status   Specimen Description   Final    BLOOD LEFT ARM Performed at [facility], [address] [place], [place] [address]    Special Requests   Final    BOTTLES DRAWN AEROBIC AND ANAEROBIC Blood Culture results may not be optimal due to an excessive volume of blood received in culture bottles Performed at [facility], [address], [place], [place] [address]    Culture PENDING  Incomplete   Report Status PENDING  Incomplete       Inpatient  Medications:   Scheduled Meds: Continuous Infusions:  ceFEPime (MAXIPIME) IV Stopped (01/29/23 2357)   lactated ringers 125 mL/hr at 01/29/23 2325     Radiological Exams on Admission: DG Chest Portable 1  View  Result Date: 01/29/2023 CLINICAL DATA:  Fever.  Arm swelling. EXAM: PORTABLE CHEST 1 VIEW COMPARISON:  11/14/2019 FINDINGS: The cardiomediastinal contours are normal. The lungs are clear. Pulmonary vasculature is normal. No consolidation, pleural effusion, or pneumothorax. No acute osseous abnormalities are seen. IMPRESSION: No acute findings. Electronically Signed   By: Narda Rutherford M.D.   On: 01/29/2023 16:14    Impression/Recommendations Active Problems:   * No active hospital problems. *  Rash Patient seen in the ED on 7/4 for right upper extremity swelling and erythema.  Ultrasound was negative for DVT and he was diagnosed with cellulitis and discharged on doxycycline.  He returns to the ED with rash spreading to his left upper extremity, back, upper chest/neck, and bilateral lower extremities.  Endorsing pain in both of his shoulders and both of his upper extremities are swollen.  Also endorsing bilateral wrist and knee joint pain. ?SJS/TEN given no bullae, sloughing of skin, skin necrosis, or mucosal involvement.  ?DRESS but no eosinophilia on labs.  No tick bite reported.  No fever, headache, or neck pain/stiffness to suggest meningitis.  Rash possibly related to underlying rheumatologic process given joint pain/swelling.  ESR and CRP elevated, ANA pending.  Patient presenting with tachycardia but no fever.  Mild leukocytosis on labs but lactate normal x 2.  Blood pressure initially soft and improved with IV fluids.  He was given vancomycin and cefepime in the ED.  Blood cultures pending.  Monitor WBC count.  Recommend transfer to a tertiary care center for evaluation by rheumatology and dermatology.  Will need skin biopsy.  Told by ED physician that [facility] and [facility] are not accepting  transfers and he is on waiting list at [facility].  Recommend consulting ID while the patient is still here at [facility] ED.  Prediabetes A1c 5.8 in February 2021, will repeat.  Painful small fiber neuropathy Followed by neurology at [facility].  Thank you for this consultation.  Our [facility] hospitalist team will follow the patient with you.  Time Spent: 90 minutes  Joe Ray M.D. [facility] 01/30/2023, 2:45 AM

## 2023-01-30 NOTE — ED Provider Notes (Addendum)
Patient was seen yesterday in the emergency room for evaluation of fever joint aches body aches.  Patient was initially seen at [facility].  He was transferred to [facility] for continued evaluation.  Case was discussed with the hospitalist service who recommended transfer to another medical facility that had rheumatology and dermatology.  [facility] and [facility] were not accepting.  [facility] has the patient on their wait list however they do not have any beds available and it is unclear when there will be any bed availability.  Patient noted to have fever this morning up to 101.  Blood pressure soft 91/50.  Reepat now 107/54 Pt still having pain.  Gen temp 101 Car tahcycardic Lungs CTA Abd soft  IV fluids have been ordered.  Patient is continued on broad-spectrum antibiotics.  I discussed the case with Dr. Jerral Ralph to see if the patient should be admitted to the hospital while he is awaiting potential transfer.  I will also consult infectious disease. Morphine ordered for pain.   Linwood Dibbles, MD 01/30/23 [phone]  Case discussed with Dr. Algis Liming infectious disease.  He will consult on patient.  Recommends admission to the hospital.  Suggest CT scan chest abdomen pelvis for further evaluation of his fever of unknown cause.  Could consider skin biopsy while inpatient here.    Linwood Dibbles, MD 01/30/23 [phone]

## 2023-01-30 NOTE — Consult Note (Signed)
Date of Admission:  01/29/2023          Reason for Consult: FUO, rash, myalgias, hypotension    Referring Provider: Linwood Dibbles, MD   Assessment:  FUO Diffuse rash with blanching erythema and some bullous components as well Myalgias and now prominently involving his upper extremities particularly when he grips objects, prior shoulder pain and knee pain Hypotension Small small fiber neuropathy Prior pruritus on doxycycline Bactrim Pior facial abscess with MRSA  Plan:  Agree with vancomycin and cefepime for now Would get CT chest abdomen pelvis I have sent FUO labs off as well as a serum cortisol I would strongly consider addition of of corticosteroids in case there is a component of adrenal insufficiency or allergic reaction driving his pathology Follow-up blood cultures  Dr. Luciana Axe will be here tomorrow.  Principal Problem:   Rash Active Problems:   Severe sepsis (HCC)   Scheduled Meds:  enoxaparin (LOVENOX) injection  40 mg Subcutaneous Q24H   Continuous Infusions:  sodium chloride 1,000 mL (01/30/23 0858)   ceFEPime (MAXIPIME) IV Stopped (01/30/23 0721)   lactated ringers 125 mL/hr at 01/30/23 0703   magnesium sulfate bolus IVPB     potassium PHOSPHATE IVPB (in mmol) 30 mmol (01/30/23 1205)   vancomycin 1,250 mg (01/30/23 1108)   PRN Meds:.acetaminophen, diphenhydrAMINE, morphine injection, ondansetron **OR** ondansetron (ZOFRAN) IV  HPI: Joe Ray is a 42 y.o. male with past medical history significant for iron deficiency and 5-year history of initially bilateral distal lower extremity burning and tingling who now has episodic symptoms in his hands and scalp as well as erectile dysfunction.   He has been worked up by Hematology/Oncology , Neurology, [facility] rheumatology  later at [facility] Rheumatology and Neurology.  I note that in the past he did have an area in his face in 2016 was cystic and treated as an abscess with an I&D with a  culture positive for MRSA.  Thereafter he developed generalized itching and rash. At that time he had been on doxycycline and bactrim it appears.  His labs were fairly unremarkable.  He also underwent nerve conduction studies that were normal.  He is thought to possibly have small fiber neuropathy.  He had felt completely normal other than his chronic symptoms on Wednesday.  However on July 4 he developed blotchy erythema on his right forearm as well as pain in his forearms and shoulders particularly if he grips objects.  He was seen at an urgent care where he was found to be tachycardic (he states that normally his pulse is in the 70s ) and then ultimately was [facility]  DVT was excluded. COVID 19 negative.  White blood cell count was elevated at 12.1 thousand with 90% neutrophils.  Clinician there thought that he had cellulitis and prescribed him doxycycline which she took roughly 4 doses of but which failed to improve any symptoms though his shoulder pain did improve.  Then developed a diffuse rash on his upper extremities chest and back with some bullous components particularly on posteriorly behind the neck and on his wrist.  He wonders if this has something to do with when he was working in his garage and was sweating profusely in his upper extremities and trunk.  He notes that the rash does not involve his legs at all.  He did have some knee pain that is mentioned in the hospitalist consult note but which she  did not tell me about when I saw him.  He is also developed some tingling numbness in his tongue and mouth though on exam there is no obvious ulcerations or exudates.  He has had headaches but they have improved after he received his fluid boluses  He came to the emergency room at [facility] found to be febrile to 1.4 degrees.  Blood cultures were obtained and he has been given vancomycin and cefepime.  His leukocytosis has worsened in the  interim.  He is negative sed rate and CRP were slightly elevated and ANA has been sent along with HIV antibody  Labs done at [facility] included normal B6 B12 negative heavy metal profile negative angiotensin-converting enzyme, negative serum protein electrophoresis.  Kappa and lambda chains were sent but not back yet.  Attempts were made to transfer him to a tertiary care center that had rheumatology and dermatology expertise but there is no beds available.  I was called by ER physician this morning Dr. Lynelle Doctor regarding potential admission to [facility] which I agree is appropriate.  He has apparently developed hypotension in the ER with blood pressures that dipped into the 70s systolic but which have responded to several IV fluid boluses.  I had recommended a CT test abdomen pelvis to look for occult infection that might be driving his pathology but his blood pressures were not stable enough for him to go for scans  He has no history of intravenous drug use or any drug use whatsoever.  He has not been sexually active for several years due to his erectile dysfunction in the context of his small likely small fiber nerve disease.  Previously was sexually active with women.  No history of STIs.  Certainly a puzzling picture and if he had not developed hypotension I would think that his constellation of symptoms would be likely due to autoimmune pathology which is certainly not excluded.  Given his hypotension and fever and fevers I think it is certainly appropriate to continue with broad-spectrum antibiotics for now and I think imaging of the chest abdomen pelvis with CT scan is imperative as well  I will check some FUO labs including a hepatitis panel, EBV CMV serologies syphilis test, rheumatoid factor SSA SSB.  I will check a serum cortisol level.  In addition antibiotics I would strongly consider giving him corticosteroids in case there is an allergic or autoimmune component to  his presentation.  I did note that there is a history of a facial abscess that he had treated in 2016 with Bactrim and doxycycline after which she developed pruritus in which time these antibiotics were stopped and he was given hydroxyzine.  I doubt that his current presentation is due to an allergy to doxycycline since his symptoms predated the prescription of doxycycline but I have tentatively added Bactrim and doxycycline to his allergy list giving the fact that he had itching in 2016.  This is certainly not consistent with [facility] spotted fever or Ehrlichia and he should have responded to doxycycline if he had a tickborne infection.  I have personally spent 86 minutes involved in face-to-face and non-face-to-face activities for this patient on the day of the visit. Professional time spent includes the following activities: Preparing to see the patient (review of tests), Obtaining and/or reviewing separately obtained history (admission/discharge record), Performing a medically appropriate examination and/or evaluation , Ordering medications/tests/procedures, referring and communicating with other health care professionals, Documenting clinical information in the EMR, Independently interpreting  results (not separately reported), Communicating results to the patient/family/caregiver, Counseling and educating the patient/family/caregiver and Care coordination (not separately reported).          Review of Systems  Constitutional:  Positive for diaphoresis, fever and malaise/fatigue. Negative for chills and weight loss.  HENT:  Negative for congestion and sore throat.   Eyes:  Negative for blurred vision and photophobia.  Respiratory:  Negative for cough, shortness of breath and wheezing.   Cardiovascular:  Positive for palpitations. Negative for chest pain and leg swelling.  Gastrointestinal:  Negative for abdominal pain, blood in stool, constipation, diarrhea, heartburn, melena, nausea and  vomiting.  Genitourinary:  Negative for dysuria, flank pain and hematuria.  Musculoskeletal:  Positive for joint pain and myalgias. Negative for back pain and falls.  Skin:  Positive for rash. Negative for itching.  Neurological:  Positive for weakness and headaches. Negative for dizziness, focal weakness and loss of consciousness.  Endo/Heme/Allergies:  Does not bruise/bleed easily.  Psychiatric/Behavioral:  Negative for depression and suicidal ideas. The patient does not have insomnia.     Past Medical History:  Diagnosis Date   Allergy     Social History   Tobacco Use   Smoking status: Never   Smokeless tobacco: Never  Vaping Use   Vaping Use: Never used  Substance Use Topics   Alcohol use: Never   Drug use: Never    Family History  Problem Relation Age of Onset   Diabetes Father    Allergies  Allergen Reactions   Amoxicillin Diarrhea and Rash    OBJECTIVE: Blood pressure (!) 81/50, pulse (!) 106, temperature 98.1 F (36.7 C), temperature source Oral, resp. rate (!) 23, height 5\' 8"  (1.727 m), weight 75.3 kg, SpO2 100 %.  Physical Exam Constitutional:      Appearance: He is well-developed.  HENT:     Head: Normocephalic and atraumatic.  Eyes:     Conjunctiva/sclera: Conjunctivae normal.  Cardiovascular:     Rate and Rhythm: Regular rhythm. Tachycardia present.     Heart sounds: No murmur heard.    No friction rub. No gallop.  Pulmonary:     Effort: Pulmonary effort is normal. No respiratory distress.     Breath sounds: No stridor. No wheezing or rhonchi.  Abdominal:     General: Bowel sounds are normal. There is no distension.     Palpations: Abdomen is soft. There is no mass.     Tenderness: There is no abdominal tenderness.     Hernia: No hernia is present.  Musculoskeletal:     Right shoulder: Normal. No swelling, deformity or effusion.     Left shoulder: Normal.     Right upper arm: Edema present. No tenderness.     Left upper arm: Edema present.  No tenderness.     Cervical back: Normal range of motion and neck supple.  Skin:    General: Skin is warm and dry.     Coloration: Skin is not pale.     Findings: No erythema or rash.  Neurological:     General: No focal deficit present.     Mental Status: He is alert and oriented to person, place, and time.  Psychiatric:        Mood and Affect: Mood normal.        Behavior: Behavior normal.        Thought Content: Thought content normal.        Judgment: Judgment normal.    Rash  Lab Results Lab Results  Component Value Date   WBC 10.6 (H) 01/30/2023   HGB 13.1 01/30/2023   HCT 39.3 01/30/2023   MCV 84.2 01/30/2023   PLT 277 01/30/2023    Lab Results  Component Value Date   CREATININE 0.79 01/30/2023   BUN 14 01/30/2023   NA 137 01/30/2023   K 3.2 (L) 01/30/2023   CL 108 01/30/2023   CO2 21 (L) 01/30/2023    Lab Results  Component Value Date   ALT 16 01/30/2023   AST 13 (L) 01/30/2023   ALKPHOS 41 01/30/2023   BILITOT 0.6 01/30/2023     Microbiology: Recent Results (from the past 240 hour(s))  SARS Coronavirus 2 by RT PCR (hospital order, performed in [facility] hospital lab) *cepheid single result test* Anterior Nasal Swab     Status: None   Collection Time: 01/27/23  4:42 PM   Specimen: Anterior Nasal Swab  Result Value Ref Range Status   SARS Coronavirus 2 by RT PCR NEGATIVE NEGATIVE Final    Comment: (NOTE) SARS-CoV-2 target nucleic acids are NOT DETECTED.  The SARS-CoV-2 RNA is generally detectable in upper and lower respiratory specimens during the acute phase of infection. The lowest concentration of SARS-CoV-2 viral copies this assay can detect is 250 copies / mL. A negative result does not preclude SARS-CoV-2 infection and should not be used as the sole basis for treatment or other patient management decisions.  A negative result may occur with improper specimen collection / handling, submission  of specimen other than nasopharyngeal swab, presence of viral mutation(s) within the areas targeted by this assay, and inadequate number of viral copies (<250 copies / mL). A negative result must be combined with clinical observations, patient history, and epidemiological information.  Fact Sheet for Patients:   RoadLapTop.co.za  Fact Sheet for Healthcare Providers: http://kim-miller.com/  This test is not yet approved or  cleared by the [place] FDA and has been authorized for detection and/or diagnosis of SARS-CoV-2 by FDA under an Emergency Use Authorization (EUA).  This EUA will remain in effect (meaning this test can be used) for the duration of the COVID-19 declaration under Section 564(b)(1) of the Act, 21 U.S.C. section 360bbb-3(b)(1), unless the authorization is terminated or revoked sooner.  Performed at [facility], [address] [place], [place] [address]   Blood culture (routine x 2)     Status: None (Preliminary result)   Collection Time: 01/29/23  2:57 PM   Specimen: BLOOD RIGHT ARM  Result Value Ref Range Status   Specimen Description   Final    BLOOD RIGHT ARM Performed at [facility], [address] [place], [place] [address]    Special Requests   Final    BOTTLES DRAWN AEROBIC AND ANAEROBIC Blood Culture results may not be optimal due to an excessive volume of blood received in culture bottles Performed at [facility], [address] [place], [place] [address]    Culture   Final    NO GROWTH < 24 HOURS Performed at [facility], [address], [place], [place] [address]    Report Status PENDING  Incomplete  Blood culture (routine x 2)     Status: None (Preliminary result)   Collection Time: 01/29/23  2:57 PM   Specimen: BLOOD LEFT ARM  Result Value Ref Range Status   Specimen Description   Final    BLOOD LEFT ARM Performed at [facility], [address], [place], [place] [address]    Special Requests   Final    BOTTLES DRAWN AEROBIC AND ANAEROBIC Blood Culture results may not be optimal due to an excessive volume of blood received in culture bottles Performed at [facility], [address], [place], [place] [address]    Culture   Final    NO GROWTH < 24 HOURS Performed at [facility], [address], [place], [place] [address]    Report Status PENDING  Incomplete    Acey Lav, MD [facility] [phone] pager  01/30/2023, 12:58 PM

## 2023-01-30 NOTE — Progress Notes (Signed)
Pharmacy Antibiotic Note  Joe Ray is a 42 y.o. male admitted on 01/29/2023 with cellulitis.  Pharmacy has been consulted for vancomycin dosing. SCr 0.78 on presentation. Patient already started on cefepime and received 1x dose of vancomycin 1g IV x 1.  Plan: Continue vancomycin 1250mg  IV q12h. Goal AUC 400-550. Expected AUC: 452 SCr used: 0.8 Monitor clinical progress, c/s, renal function F/u de-escalation plan/LOT, vancomycin levels as indicated   Height: 5\' 8"  (172.7 cm) Weight: 75.3 kg (166 lb) IBW/kg (Calculated) : 68.4  Temp (24hrs), Avg:99.3 F (37.4 C), Min:98.1 F (36.7 C), Max:101.4 F (38.6 C)  Recent Labs  Lab 01/27/23 1531 01/29/23 1457 01/29/23 1713 01/30/23 0413  WBC 12.1* 13.3*  --  10.6*  CREATININE 0.81 0.78  --   --   LATICACIDVEN  --  1.9 1.2  --     Estimated Creatinine Clearance: 117.6 mL/min (by C-G formula based on SCr of 0.78 mg/dL).    Allergies  Allergen Reactions   Amoxicillin Diarrhea and Rash    Leia Alf, PharmD, BCPS Please check AMION for all [facility] contact numbers Clinical Pharmacist 01/30/2023 7:48 AM

## 2023-01-30 NOTE — ED Notes (Signed)
ED TO INPATIENT HANDOFF REPORT  ED Nurse Name and Phone #: [phone]  S Name/Age/Gender Joe Ray 42 y.o. male Room/Bed: 006C/006C  Code Status   Code Status: Full Code  Home/SNF/Other Home Patient oriented to: self, place, time, and situation Is this baseline? Yes   Triage Complete: Triage complete  Chief Complaint Severe sepsis (HCC) [A41.9, R65.20]  Triage Note Pt states he has bilateral arm swelling and redness. Pt states he cannot sleep due to pain and he has had itching as well. Pt has blistering on right arm, as well as new rash to chest. Pt has been taking doxycycline as prescribed- "taken 4 pills so far". No relief with benadryl   Allergies Allergies  Allergen Reactions   Bactrim [Sulfamethoxazole-Trimethoprim] Other (See Comments)    Itching see doxy   Doxycycline Other (See Comments)    Itching in 2016 while on doxycycline and bactrim for facial abscess   Amoxicillin Diarrhea and Rash    Level of Care/Admitting Diagnosis ED Disposition     ED Disposition  Admit   Condition  --   Comment  Hospital Area: [facility] [[id number]]  Level of Care: Progressive [102]  Admit to Progressive based on following criteria: CARDIOVASCULAR & THORACIC of moderate stability with acute coronary syndrome symptoms/low risk myocardial infarction/hypertensive urgency/arrhythmias/heart failure potentially compromising stability and stable post cardiovascular intervention patients.  May admit patient to [facility] or [facility] if equivalent level of care is available:: No  Covid Evaluation: Confirmed COVID Negative  Diagnosis: Severe sepsis [facility]) [[id number]]  Admitting Physician: Dorcas Carrow [[id number]]  Attending Physician: Dorcas Carrow [[id number]]  Certification:: I certify this patient will need inpatient services for at least 2 midnights  Estimated Length of Stay: 2          B Medical/Surgery History Past Medical History:  Diagnosis Date   Allergy     History reviewed. No pertinent surgical history.   A IV Location/Drains/Wounds Patient Lines/Drains/Airways Status     Active Line/Drains/Airways     Name Placement date Placement time Site Days   Peripheral IV 01/29/23 20 G Right Antecubital 01/29/23  1503  Antecubital  1   Peripheral IV 01/29/23 Anterior;Distal;Left;Upper Arm 01/29/23  1504  Arm  1            Intake/Output Last 24 hours  Intake/Output Summary (Last 24 hours) at 01/30/2023 1326 Last data filed at 01/30/2023 1112 Gross per 24 hour  Intake 2000 ml  Output 300 ml  Net 1700 ml    Labs/Imaging Results for orders placed or performed during the hospital encounter of 01/29/23 (from the past 48 hour(s))  Blood culture (routine x 2)     Status: None (Preliminary result)   Collection Time: 01/29/23  2:57 PM   Specimen: BLOOD RIGHT ARM  Result Value Ref Range   Specimen Description      BLOOD RIGHT ARM Performed at [facility], [address], [place], [place] [address]    Special Requests      BOTTLES DRAWN AEROBIC AND ANAEROBIC Blood Culture results may not be optimal due to an excessive volume of blood received in culture bottles Performed at [facility], [address] [place], [place] [address]    Culture      NO GROWTH < 24 HOURS Performed at [facility], [address] [place], [place] [address]    Report Status PENDING   Blood culture (routine x 2)     Status:  None (Preliminary result)   Collection Time: 01/29/23  2:57 PM   Specimen: BLOOD LEFT ARM  Result Value Ref Range   Specimen Description      BLOOD LEFT ARM Performed at [facility], [address], [place], [place] [address]    Special Requests      BOTTLES DRAWN AEROBIC AND ANAEROBIC Blood Culture results may not be optimal due to an excessive volume of blood received in culture bottles Performed at [facility], [address], [place], [place] [address]    Culture       NO GROWTH < 24 HOURS Performed at [facility], [address] [place], [place] [address]    Report Status PENDING   CBC with Differential     Status: Abnormal   Collection Time: 01/29/23  2:57 PM  Result Value Ref Range   WBC 13.3 (H) 4.0 - 10.5 K/uL   RBC 4.90 4.22 - 5.81 MIL/uL   Hemoglobin 14.4 13.0 - 17.0 g/dL   HCT 29.5 28.4 - 13.2 %   MCV 83.7 80.0 - 100.0 fL   MCH 29.4 26.0 - 34.0 pg   MCHC 35.1 30.0 - 36.0 g/dL   RDW 44.0 10.2 - 72.5 %   Platelets 291 150 - 400 K/uL   nRBC 0.0 0.0 - 0.2 %   Neutrophils Relative % 84 %   Neutro Abs 11.1 (H) 1.7 - 7.7 K/uL   Lymphocytes Relative 13 %   Lymphs Abs 1.8 0.7 - 4.0 K/uL   Monocytes Relative 3 %   Monocytes Absolute 0.4 0.1 - 1.0 K/uL   Eosinophils Relative 0 %   Eosinophils Absolute 0.0 0.0 - 0.5 K/uL   Basophils Relative 0 %   Basophils Absolute 0.0 0.0 - 0.1 K/uL   Immature Granulocytes 0 %   Abs Immature Granulocytes 0.06 0.00 - 0.07 K/uL    Comment: Performed at [facility], [address] [place], [place] [address]  Comprehensive metabolic panel     Status: Abnormal   Collection Time: 01/29/23  2:57 PM  Result Value Ref Range   Sodium 136 135 - 145 mmol/L   Potassium 3.5 3.5 - 5.1 mmol/L   Chloride 103 98 - 111 mmol/L   CO2 24 22 - 32 mmol/L   Glucose, Bld 130 (H) 70 - 99 mg/dL    Comment: Glucose reference range applies only to samples taken after fasting for at least 8 hours.   BUN 20 6 - 20 mg/dL   Creatinine, Ser 0.34 0.61 - 1.24 mg/dL   Calcium 9.0 8.9 - 74.2 mg/dL   Total Protein 6.0 (L) 6.5 - 8.1 g/dL   Albumin 3.5 3.5 - 5.0 g/dL   AST 11 (L) 15 - 41 U/L   ALT 20 0 - 44 U/L   Alkaline Phosphatase 57 38 - 126 U/L   Total Bilirubin 0.5 0.3 - 1.2 mg/dL   GFR, Estimated >59 >56 mL/min    Comment: (NOTE) Calculated using the CKD-EPI Creatinine Equation (2021)    Anion gap 9 5 - 15    Comment: Performed at [facility], [address], [place], [place]  [address]  Lactic acid, plasma     Status: None   Collection Time: 01/29/23  2:57 PM  Result Value Ref Range   Lactic Acid, Venous 1.9 0.5 - 1.9 mmol/L    Comment: Performed at [facility], [address] [place], [place] [address]  Urinalysis, w/ Reflex  to Culture (Infection Suspected) -Urine, Clean Catch     Status: Abnormal   Collection Time: 01/29/23  2:57 PM  Result Value Ref Range   Specimen Source URINE, CLEAN CATCH    Color, Urine YELLOW YELLOW   APPearance CLEAR CLEAR   Specific Gravity, Urine 1.026 1.005 - 1.030   pH 5.5 5.0 - 8.0   Glucose, UA NEGATIVE NEGATIVE mg/dL   Hgb urine dipstick NEGATIVE NEGATIVE   Bilirubin Urine NEGATIVE NEGATIVE   Ketones, ur 15 (A) NEGATIVE mg/dL   Protein, ur TRACE (A) NEGATIVE mg/dL   Nitrite NEGATIVE NEGATIVE   Leukocytes,Ua NEGATIVE NEGATIVE   RBC / HPF 0-5 0 - 5 RBC/hpf   WBC, UA 0-5 0 - 5 WBC/hpf    Comment:        Reflex urine culture not performed if WBC <=10, OR if Squamous epithelial cells >5. If Squamous epithelial cells >5 suggest recollection.    Bacteria, UA NONE SEEN NONE SEEN   Squamous Epithelial / HPF 0-5 0 - 5 /HPF   Mucus PRESENT     Comment: Performed at [facility], [address] [place], [place] [address]  Sedimentation rate     Status: Abnormal   Collection Time: 01/29/23  3:44 PM  Result Value Ref Range   Sed Rate 28 (H) 0 - 16 mm/hr    Comment: Performed at [facility], [address] [place], [place] [address]  C-reactive protein     Status: Abnormal   Collection Time: 01/29/23  3:44 PM  Result Value Ref Range   CRP 20.8 (H) <1.0 mg/dL    Comment: Performed at [facility], [address] [place], [place] [address]  Lactic acid, plasma     Status: None   Collection Time: 01/29/23  5:13 PM  Result Value Ref Range   Lactic Acid, Venous 1.2 0.5 - 1.9 mmol/L    Comment: Performed at [facility], [address],  [place], [place] [address]  Hemoglobin A1c     Status: None   Collection Time: 01/30/23  4:13 AM  Result Value Ref Range   Hgb A1c MFr Bld 5.0 4.8 - 5.6 %    Comment: (NOTE) Pre diabetes:          5.7%-6.4%  Diabetes:              >6.4%  Glycemic control for   <7.0% adults with diabetes    Mean Plasma Glucose 96.8 mg/dL    Comment: Performed at [facility], [address], [place], [place] [address]  CBC     Status: Abnormal   Collection Time: 01/30/23  4:13 AM  Result Value Ref Range   WBC 10.6 (H) 4.0 - 10.5 K/uL   RBC 4.67 4.22 - 5.81 MIL/uL   Hemoglobin 13.1 13.0 - 17.0 g/dL   HCT 57.8 46.9 - 62.9 %   MCV 84.2 80.0 - 100.0 fL   MCH 28.1 26.0 - 34.0 pg   MCHC 33.3 30.0 - 36.0 g/dL   RDW 52.8 41.3 - 24.4 %   Platelets 277 150 - 400 K/uL   nRBC 0.0 0.0 - 0.2 %    Comment: Performed at [facility], [address] [place], [place] [address]  Comprehensive metabolic panel     Status: Abnormal   Collection Time: 01/30/23 10:17 AM  Result Value Ref Range   Sodium 137 135 - 145 mmol/L   Potassium 3.2 (L) 3.5 -  5.1 mmol/L   Chloride 108 98 - 111 mmol/L   CO2 21 (L) 22 - 32 mmol/L   Glucose, Bld 100 (H) 70 - 99 mg/dL    Comment: Glucose reference range applies only to samples taken after fasting for at least 8 hours.   BUN 14 6 - 20 mg/dL   Creatinine, Ser 1.61 0.61 - 1.24 mg/dL   Calcium 7.1 (L) 8.9 - 10.3 mg/dL   Total Protein 4.2 (L) 6.5 - 8.1 g/dL   Albumin 2.0 (L) 3.5 - 5.0 g/dL   AST 13 (L) 15 - 41 U/L   ALT 16 0 - 44 U/L   Alkaline Phosphatase 41 38 - 126 U/L   Total Bilirubin 0.6 0.3 - 1.2 mg/dL   GFR, Estimated >09 >60 mL/min    Comment: (NOTE) Calculated using the CKD-EPI Creatinine Equation (2021)    Anion gap 8 5 - 15    Comment: Performed at [facility], [address] [place], [place] [address]  Magnesium     Status: Abnormal   Collection Time: 01/30/23 10:17 AM  Result Value Ref Range   Magnesium 1.3 (L) 1.7 - 2.4 mg/dL    Comment:  Performed at [facility], [address], [place], [place] [address]  Phosphorus     Status: Abnormal   Collection Time: 01/30/23 10:17 AM  Result Value Ref Range   Phosphorus 1.6 (L) 2.5 - 4.6 mg/dL    Comment: Performed at [facility], [address], [place], [place] [address]  Lactic acid, plasma     Status: None   Collection Time: 01/30/23 10:17 AM  Result Value Ref Range   Lactic Acid, Venous 1.1 0.5 - 1.9 mmol/L    Comment: Performed at [facility], [address], [place], [place] [address]   DG Chest Portable 1 View  Result Date: 01/29/2023 CLINICAL DATA:  Fever.  Arm swelling. EXAM: PORTABLE CHEST 1 VIEW COMPARISON:  11/14/2019 FINDINGS: The cardiomediastinal contours are normal. The lungs are clear. Pulmonary vasculature is normal. No consolidation, pleural effusion, or pneumothorax. No acute osseous abnormalities are seen. IMPRESSION: No acute findings. Electronically Signed   By: Narda Rutherford M.D.   On: 01/29/2023 16:14    Pending Labs Unresulted Labs (From admission, onward)     Start     Ordered   01/31/23 0500  Hepatitis A antibody, total  Tomorrow morning,   R        01/30/23 1256   01/31/23 0500  HCV Ab w Reflex to Quant PCR  Tomorrow morning,   R        01/30/23 1256   01/31/23 0500  Hepatitis B surface antibody,quantitative  Tomorrow morning,   R        01/30/23 1256   01/31/23 0500  Hepatitis B surface antigen  Tomorrow morning,   R        01/30/23 1256   01/30/23 1300  RPR  Once,   R        01/30/23 1259   01/30/23 1257  CMV IgM  (CMV Antibodies by EIA (PNL))  Once,   R        01/30/23 1256   01/30/23 1257  Cmv antibody, IgG (EIA)  (CMV Antibodies by EIA (PNL))  Once,   R        01/30/23 1256   01/30/23 1257  Sjogrens syndrome-A extractable nuclear antibody  (Sjogren's Syndrome Antibods (SSA + SSB) (PNL))  Once,  R        01/30/23 1256   01/30/23 1257  Sjogrens syndrome-B extractable nuclear antibody  (Sjogren's Syndrome Antibods (SSA  + SSB) (PNL))  Once,   R        01/30/23 1256   01/30/23 1257  EPSTEIN-BARR VIRUS (EBV) Antibody Profile  Once,   R        01/30/23 1256   01/30/23 1257  Rheumatoid factor  Once,   R        01/30/23 1256   01/30/23 1257  ANA w/Reflex if Positive  Once,   R        01/30/23 1256   01/30/23 1256  Cortisol  Once,   R        01/30/23 1256   01/30/23 1008  Procalcitonin  Once,   R       References:    Procalcitonin Lower Respiratory Tract Infection AND Sepsis Procalcitonin Algorithm   01/30/23 1007   01/30/23 1008  Lactic acid, plasma  STAT Now then every 3 hours,   R (with STAT occurrences)      01/30/23 1007   01/30/23 1006  HIV Antibody (routine testing w rflx)  (HIV Antibody (Routine testing w reflex) panel)  Once,   R        01/30/23 1007   01/29/23 1534  Antinuclear Antibodies, IFA  Once,   URGENT        01/29/23 1533            Vitals/Pain Today's Vitals   01/30/23 1230 01/30/23 1245 01/30/23 1300 01/30/23 1315  BP: (!) 89/55 93/63 100/66 (!) 88/57  Pulse: 96 98 98 98  Resp: (!) 26 (!) 34 (!) 38 (!) 21  Temp:      TempSrc:      SpO2: 100% 100% 100% 100%  Weight:      Height:      PainSc:        Isolation Precautions No active isolations  Medications Medications  ceFEPIme (MAXIPIME) 2 g in sodium chloride 0.9 % 100 mL IVPB (0 g Intravenous Stopped 01/30/23 0721)  lactated ringers infusion ( Intravenous New Bag/Given 01/30/23 0703)  acetaminophen (TYLENOL) tablet 650 mg (650 mg Oral Given 01/30/23 0734)  diphenhydrAMINE (BENADRYL) capsule 50 mg (50 mg Oral Given 01/30/23 0734)  sodium chloride 0.9 % bolus 1,000 mL (0 mLs Intravenous Stopped 01/30/23 0859)    Followed by  0.9 %  sodium chloride infusion (1,000 mLs Intravenous New Bag/Given 01/30/23 0858)  vancomycin (VANCOREADY) IVPB 1250 mg/250 mL (1,250 mg Intravenous New Bag/Given 01/30/23 1108)  morphine (PF) 4 MG/ML injection 4 mg (has no administration in time range)  enoxaparin (LOVENOX) injection 40 mg (has no  administration in time range)  ondansetron (ZOFRAN) tablet 4 mg (has no administration in time range)    Or  ondansetron (ZOFRAN) injection 4 mg (has no administration in time range)  magnesium sulfate IVPB 2 g 50 mL (has no administration in time range)  potassium PHOSPHATE 30 mmol in dextrose 5 % 500 mL infusion (30 mmol Intravenous New Bag/Given 01/30/23 1205)  lactated ringers bolus 1,000 mL (0 mLs Intravenous Stopped 01/29/23 1632)  vancomycin (VANCOCIN) IVPB 1000 mg/200 mL premix (0 mg Intravenous Stopped 01/29/23 1632)  acetaminophen (TYLENOL) tablet 650 mg (650 mg Oral Given 01/29/23 1702)  ketorolac (TORADOL) 30 MG/ML injection 30 mg (30 mg Intravenous Given 01/29/23 1903)  sodium chloride 0.9 % bolus 1,000 mL (0 mLs Intravenous Stopped 01/30/23 1019)  sodium chloride  0.9 % bolus 2,000 mL (2,000 mLs Intravenous New Bag/Given 01/30/23 1019)    Mobility walks     Focused Assessments SKIN   R Recommendations: See Admitting Provider Note  Report given to:   Additional Notes:  pt AO X 4.  Only able to take bp when pt laying on side (pt has difficulty laying on back d/t pain), thus bp falsely low

## 2023-01-30 NOTE — ED Notes (Signed)
Pt states feeling sob.  RR increased to upper 20's, though sats remain in upper 90's.  Airway patent.  MD notified.

## 2023-01-31 DIAGNOSIS — R21 Rash and other nonspecific skin eruption: Secondary | ICD-10-CM | POA: Diagnosis not present

## 2023-01-31 DIAGNOSIS — R509 Fever, unspecified: Secondary | ICD-10-CM | POA: Diagnosis not present

## 2023-01-31 LAB — RESPIRATORY PANEL BY PCR

## 2023-01-31 LAB — CBC WITH DIFFERENTIAL/PLATELET
Abs Immature Granulocytes: 0.06 10*3/uL (ref 0.00–0.07)
Basophils Absolute: 0 10*3/uL (ref 0.0–0.1)
Basophils Relative: 0 %
Eosinophils Absolute: 0.1 10*3/uL (ref 0.0–0.5)
Eosinophils Relative: 2 %
HCT: 31.4 % — ABNORMAL LOW (ref 39.0–52.0)
Hemoglobin: 10.5 g/dL — ABNORMAL LOW (ref 13.0–17.0)
Immature Granulocytes: 1 %
Lymphocytes Relative: 16 %
Lymphs Abs: 1.2 10*3/uL (ref 0.7–4.0)
MCH: 28.2 pg (ref 26.0–34.0)
MCHC: 33.4 g/dL (ref 30.0–36.0)
MCV: 84.2 fL (ref 80.0–100.0)
Monocytes Absolute: 0.5 10*3/uL (ref 0.1–1.0)
Monocytes Relative: 7 %
Neutro Abs: 5.7 10*3/uL (ref 1.7–7.7)
Neutrophils Relative %: 74 %
Platelets: 256 10*3/uL (ref 150–400)
RBC: 3.73 MIL/uL — ABNORMAL LOW (ref 4.22–5.81)
RDW: 12.9 % (ref 11.5–15.5)
WBC: 7.6 10*3/uL (ref 4.0–10.5)
nRBC: 0 % (ref 0.0–0.2)

## 2023-01-31 LAB — COMPREHENSIVE METABOLIC PANEL
ALT: 19 U/L (ref 0–44)
AST: 18 U/L (ref 15–41)
Albumin: 2 g/dL — ABNORMAL LOW (ref 3.5–5.0)
Alkaline Phosphatase: 47 U/L (ref 38–126)
Anion gap: 7 (ref 5–15)
BUN: 12 mg/dL (ref 6–20)
CO2: 21 mmol/L — ABNORMAL LOW (ref 22–32)
Calcium: 7.2 mg/dL — ABNORMAL LOW (ref 8.9–10.3)
Chloride: 109 mmol/L (ref 98–111)
Creatinine, Ser: 0.76 mg/dL (ref 0.61–1.24)
GFR, Estimated: >60 mL/min (ref >60)
Glucose, Bld: 87 mg/dL (ref 70–99)
Potassium: 3.1 mmol/L — ABNORMAL LOW (ref 3.5–5.1)
Sodium: 137 mmol/L (ref 135–145)
Total Bilirubin: 0.5 mg/dL (ref 0.3–1.2)
Total Protein: 4.6 g/dL — ABNORMAL LOW (ref 6.5–8.1)

## 2023-01-31 LAB — RPR: RPR Ser Ql: NONREACTIVE

## 2023-01-31 LAB — SJOGRENS SYNDROME-B EXTRACTABLE NUCLEAR ANTIBODY: SSB (La) (ENA) Antibody, IgG: <0.2 AI (ref 0.0–0.9)

## 2023-01-31 LAB — CMV IGM: CMV IgM: <30 AU/mL (ref 0.0–29.9)

## 2023-01-31 LAB — SJOGRENS SYNDROME-A EXTRACTABLE NUCLEAR ANTIBODY: SSA (Ro) (ENA) Antibody, IgG: <0.2 AI (ref 0.0–0.9)

## 2023-01-31 LAB — GLUCOSE, CAPILLARY: Glucose-Capillary: 82 mg/dL (ref 70–99)

## 2023-01-31 LAB — RHEUMATOID FACTOR: Rheumatoid fact SerPl-aCnc: 15 IU/mL — ABNORMAL HIGH (ref <14.0)

## 2023-01-31 LAB — EPSTEIN-BARR VIRUS (EBV) ANTIBODY PROFILE
EBV NA IgG: 79.9 U/mL — ABNORMAL HIGH (ref 0.0–17.9)
EBV VCA IgG: 24.9 U/mL — ABNORMAL HIGH (ref 0.0–17.9)
EBV VCA IgM: <36 U/mL (ref 0.0–35.9)

## 2023-01-31 LAB — PHOSPHORUS: Phosphorus: 2.2 mg/dL — ABNORMAL LOW (ref 2.5–4.6)

## 2023-01-31 LAB — CMV ANTIBODY, IGG (EIA): CMV Ab - IgG: 1 U/mL — ABNORMAL HIGH (ref 0.00–0.59)

## 2023-01-31 LAB — HEPATITIS B SURFACE ANTIGEN: Hepatitis B Surface Ag: NONREACTIVE

## 2023-01-31 LAB — HEPATITIS A ANTIBODY, TOTAL: hep A Total Ab: REACTIVE — AB

## 2023-01-31 LAB — TSH: TSH: 4.817 u[IU]/mL — ABNORMAL HIGH (ref 0.350–4.500)

## 2023-01-31 LAB — CK: Total CK: 59 U/L (ref 49–397)

## 2023-01-31 LAB — MAGNESIUM: Magnesium: 2 mg/dL (ref 1.7–2.4)

## 2023-01-31 MED ORDER — METOPROLOL TARTRATE 5 MG/5ML IV SOLN: 5.0000 mg | INTRAVENOUS | Status: DC | PRN

## 2023-01-31 MED ORDER — POTASSIUM CHLORIDE CRYS ER 20 MEQ PO TBCR
40.0000 meq | EXTENDED_RELEASE_TABLET | Freq: Once | ORAL | Status: AC
  Administered 2023-01-31: 40 meq via ORAL
  Filled 2023-01-31: qty 2

## 2023-01-31 MED ORDER — TRAZODONE HCL 50 MG PO TABS: 50.0000 mg | ORAL_TABLET | Freq: Every evening | ORAL | Status: DC | PRN

## 2023-01-31 MED ORDER — SODIUM CHLORIDE 0.9 % IV BOLUS
1000.0000 mL | Freq: Once | INTRAVENOUS | Status: AC
  Administered 2023-01-31: 1000 mL via INTRAVENOUS

## 2023-01-31 MED ORDER — FAMOTIDINE 20 MG PO TABS
20.0000 mg | ORAL_TABLET | Freq: Every day | ORAL | Status: DC | PRN
  Administered 2023-01-31 (×2): 20 mg via ORAL
  Filled 2023-01-31 (×3): qty 1

## 2023-01-31 MED ORDER — IPRATROPIUM-ALBUTEROL 0.5-2.5 (3) MG/3ML IN SOLN: 3.0000 mL | RESPIRATORY_TRACT | Status: DC | PRN

## 2023-01-31 MED ORDER — FAMOTIDINE 20 MG PO TABS
20.0000 mg | ORAL_TABLET | Freq: Two times a day (BID) | ORAL | Status: DC | PRN
  Administered 2023-01-31 – 2023-02-02 (×5): 20 mg via ORAL
  Filled 2023-01-31 (×5): qty 1

## 2023-01-31 MED ORDER — MAGNESIUM SULFATE 2 GM/50ML IV SOLN
2.0000 g | Freq: Once | INTRAVENOUS | Status: AC
  Administered 2023-01-31: 2 g via INTRAVENOUS
  Filled 2023-01-31: qty 50

## 2023-01-31 MED ORDER — OXYCODONE HCL 5 MG PO TABS: 5.0000 mg | ORAL_TABLET | Freq: Four times a day (QID) | ORAL | Status: DC | PRN

## 2023-01-31 MED ORDER — GUAIFENESIN 100 MG/5ML PO LIQD: 5.0000 mL | ORAL | Status: DC | PRN

## 2023-01-31 MED ORDER — CEFADROXIL 500 MG PO CAPS
500.0000 mg | ORAL_CAPSULE | Freq: Two times a day (BID) | ORAL | Status: DC
  Administered 2023-01-31 – 2023-02-03 (×7): 500 mg via ORAL
  Filled 2023-01-31 (×8): qty 1

## 2023-01-31 MED ORDER — SENNOSIDES-DOCUSATE SODIUM 8.6-50 MG PO TABS: 1.0000 | ORAL_TABLET | Freq: Every evening | ORAL | Status: DC | PRN

## 2023-01-31 MED ORDER — HYDRALAZINE HCL 20 MG/ML IJ SOLN: 10.0000 mg | INTRAMUSCULAR | Status: DC | PRN

## 2023-01-31 MED ORDER — POTASSIUM PHOSPHATES 15 MMOLE/5ML IV SOLN
30.0000 mmol | Freq: Once | INTRAVENOUS | Status: AC
  Administered 2023-01-31: 30 mmol via INTRAVENOUS
  Filled 2023-01-31: qty 10

## 2023-01-31 NOTE — Progress Notes (Signed)
PROGRESS NOTE    Joe Ray  OZD:[id number] DOB: 06-23-1981 DOA: 01/29/2023 PCP: Pearline Cables, MD   Brief Narrative:  42 year old with history of prediabetes, iron deficiency, peripheral neuropathy comes to the hospital with complaints of swelling and erythema initially was started on his right hand and progressed to versus bilateral upper extremity, chest wall, knees, upper back.  Initially seen at [facility] 2 days prior to admission where ultrasound was performed and was negative.  Was diagnosed with cellulitis and discharged home.  Upon returning to ED he was noted to have SIRS with evidence of tachycardia, leukocytosis, hypotension and elevated inflammatory markers.  Due to concerns of ongoing rheumatologic/dermatology process patient was attempted to transfer at [facility] and [facility] but was unsuccessful due to no bed availability.  Patient was admitted to the hospital with infectious disease consult and started on broad-spectrum antibiotics.  Multiple autoimmune workup has been sent as well.   Assessment & Plan:  Principal Problem:   Rash Active Problems:   Severe sepsis (HCC)   FUO (fever of unknown origin)   Myalgia   Hypotension   MRSA colonization   Tachycardia   Small fiber neuropathy     Generalized rash, fever of unknown origin; improving SIRS -At this time differential is pretty broad concerning for infectious versus inflammatory/rheumatologic process going on.  No obvious mucosal involvement or airway compromise at this time. - Currently on vancomycin and cefepime.  ID is following - Blood cultures cultures-NGTD - Procalcitonin 1.85, CRP 20, sed rate 28. CK - normal - UA, HIV, cortisol-negative - Upper extremity ultrasound are negative - CT chest abdomen pelvis-mild pleural effusion - Hold off on steroids.  -Respiratory viral panel- neg.   Pending blood work-RPR, ANA, rheumatoid factor, EBV, CMV, Sjogren A and B, Aldolase Patient would benefit from  tertiary care center where rheumatology and dermatology is available.  [facility] not accepting patients.  On transfer list for [facility]  Hypokalemia/hypophosphatemia/hypomagnesemia - As needed repletion   Prediabetes A1c 5.0   Painful small fiber neuropathy Followed by neurology at [facility].    DVT prophylaxis: lovenox Code Status: Full code Family Communication:   Status is: Inpatient Currently awaiting transfer to [facility]       Diet Orders (From admission, onward)     Start     Ordered   01/30/23 1018  Diet vegetarian Room service appropriate? Yes; Fluid consistency: Thin  Diet effective now       Question Answer Comment  Room service appropriate? Yes   Fluid consistency: Thin      01/30/23 1017            Subjective: Seen at bedside.  Tells me his bilateral upper and lower extremity pain is improving.  Still having trouble bearing weight on his legs due to pain.  Denies any weakness. Requesting to try and see if he can avoid transferring him and managing him here.  I explained to him that skin lesion biopsy will be important to hopefully try and get his diagnosis.  Examination:  General exam: Appears calm and comfortable  Respiratory system: Clear to auscultation. Respiratory effort normal. Cardiovascular system: S1 & S2 heard, RRR. No JVD, murmurs, rubs, gallops or clicks. No pedal edema. Gastrointestinal system: Abdomen is nondistended, soft and nontender. No organomegaly or masses felt. Normal bowel sounds heard. Central nervous system: Alert and oriented. No focal neurological deficits. Extremities: Symmetric 5 x 5 power. Skin: Erythema and blisters noted in his neck area.  Picture as below from today    Psychiatry: Judgement and insight appear normal. Mood & affect appropriate.    Objective: Vitals:   01/31/23 0000 01/31/23 0025 01/31/23 0346 01/31/23 0535  BP: (!) 100/51 111/61 (!) 98/48 95/63  Pulse: 96 96 88 79  Resp: (!) 23 (!) 24 19 20    Temp: 99.4 F (37.4 C) 99.2 F (37.3 C) 97.7 F (36.5 C)   TempSrc: Axillary Oral Oral   SpO2: 92% 95% 94% 96%  Weight:   79.1 kg   Height:        Intake/Output Summary (Last 24 hours) at 01/31/2023 0723 Last data filed at 01/31/2023 0403 Gross per 24 hour  Intake 3733.54 ml  Output 3400 ml  Net 333.54 ml   Filed Weights   01/29/23 1046 01/31/23 0346  Weight: 75.3 kg 79.1 kg    Scheduled Meds:  enoxaparin (LOVENOX) injection  40 mg Subcutaneous Q24H   magnesium oxide  400 mg Oral BID   potassium chloride  20 mEq Oral BID   Continuous Infusions:  ceFEPime (MAXIPIME) IV 2 g (01/31/23 0536)   vancomycin 1,250 mg (01/30/23 2112)    Nutritional status     Body mass index is 26.51 kg/m.  Data Reviewed:   CBC: Recent Labs  Lab 01/27/23 1531 01/29/23 1457 01/30/23 0413  WBC 12.1* 13.3* 10.6*  NEUTROABS 10.9* 11.1*  --   HGB 17.2* 14.4 13.1  HCT 50.5 41.0 39.3  MCV 84.4 83.7 84.2  PLT 283 291 277   Basic Metabolic Panel: Recent Labs  Lab 01/27/23 1531 01/29/23 1457 01/30/23 1017  NA 137 136 137  K 4.1 3.5 3.2*  CL 104 103 108  CO2 25 24 21*  GLUCOSE 114* 130* 100*  BUN 22* 20 14  CREATININE 0.81 0.78 0.79  CALCIUM 9.1 9.0 7.1*  MG  --   --  1.3*  PHOS  --   --  1.6*   GFR: Estimated Creatinine Clearance: 117.6 mL/min (by C-G formula based on SCr of 0.79 mg/dL). Liver Function Tests: Recent Labs  Lab 01/29/23 1457 01/30/23 1017  AST 11* 13*  ALT 20 16  ALKPHOS 57 41  BILITOT 0.5 0.6  PROT 6.0* 4.2*  ALBUMIN 3.5 2.0*   No results for input(s): "LIPASE", "AMYLASE" in the last 168 hours. No results for input(s): "AMMONIA" in the last 168 hours. Coagulation Profile: No results for input(s): "INR", "PROTIME" in the last 168 hours. Cardiac Enzymes: No results for input(s): "CKTOTAL", "CKMB", "CKMBINDEX", "TROPONINI" in the last 168 hours. BNP (last 3 results) No results for input(s): "PROBNP" in the last 8760 hours. HbA1C: Recent Labs     01/30/23 0413  HGBA1C 5.0   CBG: No results for input(s): "GLUCAP" in the last 168 hours. Lipid Profile: No results for input(s): "CHOL", "HDL", "LDLCALC", "TRIG", "CHOLHDL", "LDLDIRECT" in the last 72 hours. Thyroid Function Tests: No results for input(s): "TSH", "T4TOTAL", "FREET4", "T3FREE", "THYROIDAB" in the last 72 hours. Anemia Panel: No results for input(s): "VITAMINB12", "FOLATE", "FERRITIN", "TIBC", "IRON", "RETICCTPCT" in the last 72 hours. Sepsis Labs: Recent Labs  Lab 01/29/23 1457 01/29/23 1713 01/30/23 1017 01/30/23 1659  PROCALCITON  --   --  1.85  --   LATICACIDVEN 1.9 1.2 1.1 1.1    Recent Results (from the past 240 hour(s))  SARS Coronavirus 2 by RT PCR (hospital order, performed in [facility] hospital lab) *cepheid single result test* Anterior Nasal Swab     Status: None   Collection Time: 01/27/23  4:42 PM   Specimen: Anterior Nasal Swab  Result Value Ref Range Status   SARS Coronavirus 2 by RT PCR NEGATIVE NEGATIVE Final    Comment: (NOTE) SARS-CoV-2 target nucleic acids are NOT DETECTED.  The SARS-CoV-2 RNA is generally detectable in upper and lower respiratory specimens during the acute phase of infection. The lowest concentration of SARS-CoV-2 viral copies this assay can detect is 250 copies / mL. A negative result does not preclude SARS-CoV-2 infection and should not be used as the sole basis for treatment or other patient management decisions.  A negative result may occur with improper specimen collection / handling, submission of specimen other than nasopharyngeal swab, presence of viral mutation(s) within the areas targeted by this assay, and inadequate number of viral copies (<250 copies / mL). A negative result must be combined with clinical observations, patient history, and epidemiological information.  Fact Sheet for Patients:   RoadLapTop.co.za  Fact Sheet for Healthcare  Providers: http://kim-miller.com/  This test is not yet approved or  cleared by the [place] FDA and has been authorized for detection and/or diagnosis of SARS-CoV-2 by FDA under an Emergency Use Authorization (EUA).  This EUA will remain in effect (meaning this test can be used) for the duration of the COVID-19 declaration under Section 564(b)(1) of the Act, 21 U.S.C. section 360bbb-3(b)(1), unless the authorization is terminated or revoked sooner.  Performed at [facility], [address], [place], [place] [address]   Blood culture (routine x 2)     Status: None (Preliminary result)   Collection Time: 01/29/23  2:57 PM   Specimen: BLOOD RIGHT ARM  Result Value Ref Range Status   Specimen Description   Final    BLOOD RIGHT ARM Performed at [facility], [address], [place], [place] [address]    Special Requests   Final    BOTTLES DRAWN AEROBIC AND ANAEROBIC Blood Culture results may not be optimal due to an excessive volume of blood received in culture bottles Performed at [facility], [address] [place], [place] [address]    Culture   Final    NO GROWTH 2 DAYS Performed at [facility], [address] [place], [place] [address]    Report Status PENDING  Incomplete  Blood culture (routine x 2)     Status: None (Preliminary result)   Collection Time: 01/29/23  2:57 PM   Specimen: BLOOD LEFT ARM  Result Value Ref Range Status   Specimen Description   Final    BLOOD LEFT ARM Performed at [facility], [address], [place], [place] [address]    Special Requests   Final    BOTTLES DRAWN AEROBIC AND ANAEROBIC Blood Culture results may not be optimal due to an excessive volume of blood received in culture bottles Performed at [facility], [address] [place], [place] [address]    Culture   Final    NO GROWTH 2 DAYS Performed at [facility], [address], [place], [place] [address]    Report Status PENDING  Incomplete         Radiology Studies: CT CHEST ABDOMEN PELVIS W CONTRAST  Result Date: 01/30/2023 CLINICAL DATA:  Fever of unknown etiology. EXAM: CT CHEST, ABDOMEN, AND PELVIS WITH CONTRAST TECHNIQUE: Multidetector CT imaging of the chest, abdomen and pelvis was performed following the standard protocol during bolus administration of intravenous contrast. RADIATION DOSE REDUCTION: This exam was performed according to  the departmental dose-optimization program which includes automated exposure control, adjustment of the mA and/or kV according to patient size and/or use of iterative reconstruction technique. CONTRAST:  75mL OMNIPAQUE IOHEXOL 350 MG/ML SOLN COMPARISON:  Chest radiograph, 01/29/2023. FINDINGS: CT CHEST FINDINGS Cardiovascular: Heart is normal in size and configuration. No pericardial effusion. Normal great vessels. Mediastinum/Nodes: No enlarged mediastinal, hilar, or axillary lymph nodes. Thyroid gland, trachea, and esophagus demonstrate no significant findings. Lungs/Pleura: Trace pleural effusions. Mild bilateral interstitial thickening. Focal opacity along the superior aspect of the right oblique fissure consistent with fissural fluid. Minor dependent subsegmental atelectasis in the lower lobes. No lung consolidation to suggest pneumonia. No pneumothorax. Musculoskeletal: No chest wall mass or suspicious bone lesions identified. CT ABDOMEN PELVIS FINDINGS Hepatobiliary: No focal liver abnormality is seen. No gallstones, gallbladder wall thickening, or biliary dilatation. Pancreas: Unremarkable. No pancreatic ductal dilatation or surrounding inflammatory changes. Spleen: Normal in size without focal abnormality. Adrenals/Urinary Tract: Normal adrenal glands. Kidneys normal in size, orientation and position with symmetric enhancement and excretion. 11 mm cyst, lower pole of the left kidney and 1 cm cyst, midpole of the right  kidney. No follow-up indicated. No other renal masses, no stones and no hydronephrosis. Normal ureters. Normal bladder. Stomach/Bowel: Stomach is within normal limits. Appendix appears normal. No evidence of bowel wall thickening, distention, or inflammatory changes. Vascular/Lymphatic: No significant vascular findings are present. No enlarged abdominal or pelvic lymph nodes. Reproductive: Unremarkable. Other: Small fat containing umbilical hernia.  No ascites. Musculoskeletal: Normal. IMPRESSION: 1. Lungs demonstrate interstitial thickening and small bilateral effusions. Consider mild interstitial pulmonary edema in the proper clinical setting. No evidence of pneumonia. 2. No other evidence of an acute abnormality within the chest, abdomen or pelvis. Electronically Signed   By: Amie Portland M.D.   On: 01/30/2023 15:35   DG Chest Portable 1 View  Result Date: 01/29/2023 CLINICAL DATA:  Fever.  Arm swelling. EXAM: PORTABLE CHEST 1 VIEW COMPARISON:  11/14/2019 FINDINGS: The cardiomediastinal contours are normal. The lungs are clear. Pulmonary vasculature is normal. No consolidation, pleural effusion, or pneumothorax. No acute osseous abnormalities are seen. IMPRESSION: No acute findings. Electronically Signed   By: Narda Rutherford M.D.   On: 01/29/2023 16:14           LOS: 1 day   Time spent= 35 mins    Kire Ferg Joline Maxcy, MD [facility]  If 7PM-7AM, please contact night-coverage  01/31/2023, 7:23 AM

## 2023-01-31 NOTE — Progress Notes (Signed)
Pt complains of increasing itchiness over her right shoulder and neck. Also, vesicular rash over his nape became more enlarged. Will give his benadryl PRN and advised not to frequently touch or scratch. Placed cold compress to alleviate itchiness. Will continue to monitor the patient.

## 2023-01-31 NOTE — Progress Notes (Signed)
[facility]  Patient Active Problem List   Diagnosis Date Noted   Rash 01/30/2023   Severe sepsis (HCC) 01/30/2023   FUO (fever of unknown origin) 01/30/2023   Myalgia 01/30/2023   Hypotension 01/30/2023   MRSA colonization 01/30/2023   Tachycardia 01/30/2023   Small fiber neuropathy 01/30/2023   Iron deficiency 08/04/2020   Prediabetes 11/13/2019   Peripheral neuropathy 11/13/2019   G E R D 06/30/2010   COUGH 06/30/2010     Subjective:    Patient ID: Joe Ray, male    DOB: 1980/10/28, 42 y.o.   MRN: [id number]  Chief Complaint  Patient presents with   Rash    HPI: Joe Ray is a 42 y.o. male with pertinent PMH of iron deficiency, peripheral neuropathy, prediabetes who presented to [facility] ED with swelling and erythema of bilateral UE, chest wall, knees, and upper back admitted with fever and sepsis with unknown origin,  concerning for acute rheumatologic/dermatologic process.  Seen today and appears comfortable with no acute complaints.   Allergies: Allergies  Allergen Reactions   Bactrim [Sulfamethoxazole-Trimethoprim] Other (See Comments)    Itching see doxy   Doxycycline Other (See Comments)    Itching in 2016 while on doxycycline and bactrim for facial abscess   Amoxicillin Diarrhea and Rash   Social History: Social History   Socioeconomic History   Marital status: Single    Spouse name: Not on file   Number of children: Not on file   Years of education: Not on file   Highest education level: Not on file  Occupational History   Not on file  Tobacco Use   Smoking status: Never   Smokeless tobacco: Never  Vaping Use   Vaping Use: Never used  Substance and Sexual Activity   Alcohol use: Never   Drug use: Never   Sexual activity: Not on file  Other Topics Concern   Not on file  Social History Narrative   Lives at home with family.  Works at [employer].  No children.   !/2 cup tea daily.    Social Determinants of  Health   Financial Resource Strain: Not on file  Food Insecurity: Not on file  Transportation Needs: Not on file  Physical Activity: Not on file  Stress: Not on file  Social Connections: Not on file  Intimate Partner Violence: Not on file    Objective:    BP 95/63   Pulse 79   Temp 98 F (36.7 C) (Oral)   Resp 20   Ht 5\' 8"  (1.727 m)   Wt 79.1 kg   SpO2 98%   BMI 26.51 kg/m  Nursing note and vital signs reviewed.  Physical Exam: Constitutional:Comfortably resting in bed. In no acute distress. HENT:Tip of tongue with area of more pink/irritated surface but no sign of aphthous ulcers. Lips are not peeling. Cardio:Regular rate and rhythm. No murmurs, rubs, or gallops. Pulm:Clear to auscultation bilaterally. Normal work of breathing on room air. NWG:NFAOZHYQ for extremity edema. Skin:Warm and dry. Vesicular lesions on a erythematous base present over midline upper back. Bilateral feet with slight erythema compared to remainder of the extremities but without increased warmth or edema. No changes to skin of the R thigh. Neuro:Alert and oriented x3. No focal deficit noted. Psych:Pleasant mood and affect.  Labs: CBC: WBC 7.6, HgB 10.5, HCT 31.4, PLT 256 CMP: K 3.1, CO2 21, Ca 7.2, Tpro 4.6, albumin 2.0 Phosphorous 2.2 Hepatitis panel: in process TSH:  4.817  CK: 69  Micro: Blood culture: NGTD  Imaging: CT chest/abdomen/pelvis 01/30/2023: 1. Lungs demonstrate interstitial thickening and small bilateral effusions. Consider mild interstitial pulmonary edema in the proper clinical setting. No evidence of pneumonia. 2. No other evidence of an acute abnormality within the chest, abdomen or pelvis.  Assessment & Plan:   Problem List Items Addressed This Visit       Musculoskeletal and Integument   * (Principal) Rash   Other Visit Diagnoses     Fever, unspecified fever cause    -  Primary       Meds ordered this encounter  Medications   lactated ringers bolus 1,000  mL   vancomycin (VANCOCIN) IVPB 1000 mg/200 mL premix    Order Specific Question:   Antibiotic Indication:    Answer:   Cellulitis   DISCONTD: ceFEPIme (MAXIPIME) 2 g in sodium chloride 0.9 % 100 mL IVPB    Order Specific Question:   Antibiotic Indication:    Answer:   Cellulitis   ceFEPIme (MAXIPIME) 2 g in sodium chloride 0.9 % 100 mL IVPB    Order Specific Question:   Antibiotic Indication:    Answer:   Other Indication (list below)    Order Specific Question:   Other Indication:    Answer:   cellulitis   acetaminophen (TYLENOL) tablet 650 mg   DISCONTD: lactated ringers infusion   ketorolac (TORADOL) 30 MG/ML injection 30 mg   acetaminophen (TYLENOL) tablet 650 mg   diphenhydrAMINE (BENADRYL) capsule 50 mg   sodium chloride 0.9 % bolus 1,000 mL   DISCONTD: 0.9 %  sodium chloride infusion   vancomycin (VANCOREADY) IVPB 1250 mg/250 mL    Order Specific Question:   Antibiotic Indication:    Answer:   Cellulitis   morphine (PF) 4 MG/ML injection 4 mg   sodium chloride 0.9 % bolus 1,000 mL   sodium chloride 0.9 % bolus 2,000 mL   enoxaparin (LOVENOX) injection 40 mg   OR Linked Order Group    ondansetron (ZOFRAN) tablet 4 mg    ondansetron (ZOFRAN) injection 4 mg   magnesium sulfate IVPB 2 g 50 mL   potassium PHOSPHATE 30 mmol in dextrose 5 % 500 mL infusion   furosemide (LASIX) injection 40 mg   iohexol (OMNIPAQUE) 350 MG/ML injection 75 mL   DISCONTD: magnesium oxide (MAG-OX) tablet 400 mg   potassium chloride SA (KLOR-CON M) CR tablet 20 mEq   ipratropium-albuterol (DUONEB) 0.5-2.5 (3) MG/3ML nebulizer solution 3 mL   hydrALAZINE (APRESOLINE) injection 10 mg   metoprolol tartrate (LOPRESSOR) injection 5 mg   traZODone (DESYREL) tablet 50 mg   guaiFENesin (ROBITUSSIN) 100 MG/5ML liquid 5 mL   senna-docusate (Senokot-S) tablet 1 tablet   magnesium sulfate IVPB 2 g 50 mL   famotidine (PEPCID) tablet 20 mg   Assessment and Plan: Fever of unknown origin: Has been afebrile  over previous 24h. Currently on cefepime, vancomycin. CT chest/abdomen/pelvis showed interstitial thickening and small bilateral pleural effusions but no other abnormalities. Serum cortisol WNL, 14.1. Blood cultures with NGTD. He denies recent sick exposure, no sore throat; he does say that he has had headache, fever, and that the rash feels itchy and irritated with some burning. He also has tongue pain and burning at the tip with peeling lips throughout the day but I do not appreciate mucosal involvement of the rash. Overall unclear picture to explain fever but he has had improvement with antibiotic therapy. Plan: -Narrow to cefadroxil for 1 week;  can d/c from ID standpoint -F/u various pending labs: CMV IgM, IgG; EBV, RPR outpatient 2. Bullous rash with itch and blanching erythema: Sjogrens labs WNL; ANA and RF pending. Would not think shingles given bilateral/central distribution. Has had myalgias, decreased grip strength, joint pain. History of allergy to doxycycline and bactrim consisting of itching in the past, and he was treated with doxycycline when evaluated at [facility] last week. Lower suspicion for drug reaction but would keep in mind timing of treatment with this medication and onset of bullae. No mucosal involvement but does report burning sensation on the tip of his tongue with sensation of tongue swelling and recurrent aphthous ulcers. More concerned at this point for an autoimmune component driving his symptoms, possibly complicated by concurrent infection.   Plan:  -Not emergent this hospitalization, but would recommend biopsy of the skin rash particularly over the bullous areas and would consider a short course of steroids for anti-inflammatory benefits after biopsy is obtained  -Patient advised that he needs to follow up with his rheumatologist on discharge   Champ Mungo, DO Internal Medicine PGY-3 01/31/2023, 9:58 AM

## 2023-01-31 NOTE — TOC Initial Note (Signed)
Transition of Care [facility]) - Initial/Assessment Note    Patient Details  Name: Joe Ray MRN: [id number] Date of Birth: 20-May-1981  Transition of Care [facility]) CM/SW Contact:    Gordy Clement, RN Phone Number: 01/31/2023, 3:14 PM  Clinical Narrative:                  Patient is from Home- His aging parents have moved in with Patient.  Patient states he is completely independent at baseline.  Does not own or need ant DME. He does have a PCP established : Abbe Amsterdam, MD and he is insured through [facility] PPO.DO not anticipate any TOC needs but will continue to follow   Expected Discharge Plan: Home/Self Care Barriers to Discharge: Continued Medical Work up   Patient Goals and CMS Choice   CMS Medicare.gov Compare Post Acute Care list provided to:: Other (Comment Required) (N/A) Choice offered to / list presented to : NA      Expected Discharge Plan and Services In-house Referral: NA Discharge Planning Services: NA Post Acute Care Choice: NA Living arrangements for the past 2 months: Single Family Home                 DME Arranged: N/A DME Agency: NA       HH Arranged: NA HH Agency: NA        Prior Living Arrangements/Services Living arrangements for the past 2 months: Single Family Home Lives with:: Parents (Aging parents have moved in with Patient) Patient language and need for interpreter reviewed:: Yes Do you feel safe going back to the place where you live?: Yes      Need for Family Participation in Patient Care: No (Comment) Care giver support system in place?: No (comment) (None Needed) Current home services: Other (comment) (NONE) Criminal Activity/Legal Involvement Pertinent to Current Situation/Hospitalization: No - Comment as needed  Activities of Daily Living      Permission Sought/Granted Permission sought to share information with :  (N/A)                Emotional Assessment Appearance:: Appears stated age Attitude/Demeanor/Rapport: Engaged,  Gracious Affect (typically observed): Pleasant, Quiet Orientation: : Oriented to Self, Oriented to Place, Oriented to  Time, Oriented to Situation Alcohol / Substance Use: Not Applicable Psych Involvement: No (comment)  Admission diagnosis:  Rash [R21] Severe sepsis (HCC) [A41.9, R65.20] Fever, unspecified fever cause [R50.9] 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   Tachycardia 01/30/2023   Small fiber neuropathy 01/30/2023   Iron deficiency 08/04/2020   Prediabetes 11/13/2019   Peripheral neuropathy 11/13/2019   G E R D 06/30/2010   COUGH 06/30/2010   PCP:  Pearline Cables, MD Pharmacy:   [facility] [address] [place] - [address] [place] [address] Phone: [phone] Fax: [phone]     Social Determinants of Health (SDOH) Social History: SDOH Screenings   Depression (PHQ2-9): Low Risk  (05/07/2022)  Tobacco Use: Low Risk  (01/29/2023)   SDOH Interventions:     Readmission Risk Interventions     No data to display

## 2023-01-31 NOTE — Progress Notes (Signed)
MEWS Progress Note  Patient Details Name: GRISELDA NOVA MRN: [id number] DOB: [birth year] Today's Date: 01/31/2023   MEWS Flowsheet Documentation:  Assess: MEWS Score Temp: 98.3 F (36.8 C) BP: (!) 89/62 MAP (mmHg): 71 Pulse Rate: 90 ECG Heart Rate: 91 Resp: (!) 22 Level of Consciousness: Alert SpO2: 98 % O2 Device: Room Air Assess: MEWS Score MEWS Temp: 0 MEWS Systolic: 1 MEWS Pulse: 0 MEWS RR: 1 MEWS LOC: 0 MEWS Score: 2 MEWS Score Color: Yellow Assess: SIRS CRITERIA SIRS Temperature : 0 SIRS Respirations : 1 SIRS Pulse: 1 SIRS WBC: 0 SIRS Score Sum : 2 SIRS Temperature : 0 SIRS Pulse: 1 SIRS Respirations : 1 SIRS WBC: 0 SIRS Score Sum : 2 Assess: if the MEWS score is Yellow or Red Were vital signs taken at a resting state?: No Focused Assessment: No change from prior assessment Does the patient meet 2 or more of the SIRS criteria?: No MEWS guidelines implemented : Yes, yellow Treat MEWS Interventions: Considered administering scheduled or prn medications/treatments as ordered Take Vital Signs Increase Vital Sign Frequency : Yellow: Q2hr x1, continue Q4hrs until patient remains green for 12hrs Escalate MEWS: Escalate: Yellow: Discuss with charge nurse and consider notifying provider and/or RRT Notify: Charge Nurse/RN Name of Charge Nurse/RN Notified: DesiRay      Elvera Maria 01/31/2023, 1:30 PM

## 2023-02-01 DIAGNOSIS — R531 Weakness: Secondary | ICD-10-CM

## 2023-02-01 DIAGNOSIS — R21 Rash and other nonspecific skin eruption: Secondary | ICD-10-CM | POA: Diagnosis not present

## 2023-02-01 LAB — BASIC METABOLIC PANEL
Anion gap: 7 (ref 5–15)
BUN: 11 mg/dL (ref 6–20)
CO2: 22 mmol/L (ref 22–32)
Calcium: 7.7 mg/dL — ABNORMAL LOW (ref 8.9–10.3)
Chloride: 108 mmol/L (ref 98–111)
Creatinine, Ser: 0.71 mg/dL (ref 0.61–1.24)
GFR, Estimated: >60 mL/min (ref >60)
Glucose, Bld: 92 mg/dL (ref 70–99)
Potassium: 4 mmol/L (ref 3.5–5.1)
Sodium: 137 mmol/L (ref 135–145)

## 2023-02-01 LAB — ANTINUCLEAR ANTIBODIES, IFA: ANA Ab, IFA: NEGATIVE

## 2023-02-01 LAB — CBC
HCT: 33.8 % — ABNORMAL LOW (ref 39.0–52.0)
Hemoglobin: 11.4 g/dL — ABNORMAL LOW (ref 13.0–17.0)
MCH: 29.2 pg (ref 26.0–34.0)
MCHC: 33.7 g/dL (ref 30.0–36.0)
MCV: 86.7 fL (ref 80.0–100.0)
Platelets: 262 10*3/uL (ref 150–400)
RBC: 3.9 MIL/uL — ABNORMAL LOW (ref 4.22–5.81)
RDW: 13.1 % (ref 11.5–15.5)
WBC: 5.4 10*3/uL (ref 4.0–10.5)
nRBC: 0 % (ref 0.0–0.2)

## 2023-02-01 LAB — HEPATITIS B SURFACE ANTIBODY, QUANTITATIVE: Hep B S AB Quant (Post): <3.5 m[IU]/mL — ABNORMAL LOW

## 2023-02-01 LAB — HCV AB W REFLEX TO QUANT PCR: HCV Ab: NONREACTIVE

## 2023-02-01 LAB — GLUCOSE, CAPILLARY: Glucose-Capillary: 90 mg/dL (ref 70–99)

## 2023-02-01 LAB — PHOSPHORUS: Phosphorus: 2.5 mg/dL (ref 2.5–4.6)

## 2023-02-01 LAB — MAGNESIUM: Magnesium: 2.2 mg/dL (ref 1.7–2.4)

## 2023-02-01 LAB — HCV INTERPRETATION

## 2023-02-01 LAB — CULTURE, BLOOD (ROUTINE X 2)

## 2023-02-01 MED ORDER — PANTOPRAZOLE SODIUM 40 MG PO TBEC
40.0000 mg | DELAYED_RELEASE_TABLET | Freq: Two times a day (BID) | ORAL | Status: DC
  Administered 2023-02-01 – 2023-02-03 (×5): 40 mg via ORAL
  Filled 2023-02-01 (×5): qty 1

## 2023-02-01 MED ORDER — DIPHENHYDRAMINE HCL 25 MG PO CAPS
25.0000 mg | ORAL_CAPSULE | Freq: Four times a day (QID) | ORAL | Status: DC | PRN
  Administered 2023-02-01 – 2023-02-03 (×5): 25 mg via ORAL
  Filled 2023-02-01 (×5): qty 1

## 2023-02-01 MED ORDER — HYDROCORTISONE 1 % EX CREA
TOPICAL_CREAM | Freq: Three times a day (TID) | CUTANEOUS | Status: DC
  Filled 2023-02-01: qty 28

## 2023-02-01 NOTE — Consult Note (Addendum)
Neurology Consultation    Reason for Consult: Weakness  CC: Weakness, fever  HISTORY OF PRESENT ILLNESS   HPI   Joe Ray is a 42 y.o. male with a past medical history of prediabetes, iron deficiency, peripheral neuropathy comes to the hospital with complaints of swelling and erythema. Symptoms initially started in his right hand on 4th of July in the morning. and progressed to bilateral upper extremity, chest wall, knees, upper back and feet. He woke up in pain and went to [facility] and [facility] ED. They diagnosed him with cellulitis and started ABX and fluids. States he started feeling better and then went home. That night his symptoms got worse. He noted blisters on arms and back as well as pain and itching in his upper extremities. Pain is worse with movement.   He follows with Dr. Drenda Freeze through [facility] and has gone through extensive workup but has not been able to diagnose him yet. She recommends he transfers to [facility] for management. Additionally he sees [facility]rology who started him on pamelor 25mg  nightly, ALA 600mg  daily, and recommend a punch biopsy for small fiber neuropathy.  EMGs were negative.  He states that he has noticed muscle spasms on the left side of his face from his neck to the top of his head. This has improved since he started nortriptyline 3 weeks ago. States he is feeling better today than he has all week but he is having a lot of itching in his feet and believes that his feet are starting to have some swelling. He states that his arms are almost back to baseline, but they are weaker than normal. Swelling in upper extremities has improved.   Awaiting transfer to [facility] for Rheumatology consult.   ROS: Pertinent items noted in HPI and remainder of comprehensive ROS otherwise negative.   PAST MEDICAL HISTORY    Past Medical History:  Diagnosis Date   Allergy     Family History  Problem Relation Age of Onset    Diabetes Father     Allergies:  Allergies  Allergen Reactions   Bactrim [Sulfamethoxazole-Trimethoprim] Other (See Comments)    Itching see doxy   Doxycycline Other (See Comments)    Itching in 2016 while on doxycycline and bactrim for facial abscess   Amoxicillin Diarrhea and Rash    Social History:   reports that he has never smoked. He has never used smokeless tobacco. He reports that he does not drink alcohol and does not use drugs.    Medications Medications Prior to Admission  Medication Sig Dispense Refill   Cholecalciferol (VITAMIN D-3) 1000 units CAPS Take 1,000 Units by mouth daily.     hyoscyamine (LEVSIN SL) 0.125 MG SL tablet Take 0.125 mg by mouth 4 (four) times daily as needed for cramping.     Multiple Vitamin (MULTIVITAMIN) tablet Take 1 tablet by mouth daily.     nortriptyline (PAMELOR) 25 MG capsule Take 25 mg by mouth at bedtime.     tadalafil (CIALIS) 5 MG tablet Take 5 mg by mouth daily.      EXAMINATION    Current vital signs:    02/01/2023   12:09 PM 02/01/2023   10:12 AM 02/01/2023    9:45 AM  Vitals with BMI  Weight  179 lbs   BMI  27.23   Systolic 115  116  Diastolic 78  82  Pulse 90  80    Examination:  GENERAL: Awake, alert in NAD HEENT: -  Normocephalic and atraumatic, dry mm, no lymphadenopathy, no Thyromegally LUNGS - Regular, unlabored respirations CV - S1S2 RRR, equal pulses bilaterally. ABDOMEN - Soft, nontender, nondistended with normoactive BS Ext: warm, well perfused, intact peripheral pulses, 1+ edema in right lower extremity  Integumentary:  Upper back has 3 open areas, that appear to be popped blisters, healing, CDI  Neuro: Mental Status: AAox4. Speech is clear. Patient is able to give a clear and coherent history. No signs of aphasia or neglect Cranial Nerves: II: Visual Fields are full. PERRL.   III,IV, VI: EOMI without ptosis or diplopia.  V: Facial sensation is symmetric to temperature VII: Facial movement is symmetric  resting and smiling VIII: Hearing is intact to voice X: Palate elevates symmetrically XI: Shoulder shrug is symmetric. XII: Tongue protrudes midline without atrophy or fasciculations.  Motor: Tone is normal. Bulk is normal.      Right  Left Shoulder Abduction  4/5  4-/5 Elbow Flexion   3+/5  3+/5 Elbow Extension  4/5  4/5 Hand grasp    4/5  3+/5 Hip Flexion   4+/5  4+/5 Ankle Dorsiflexion  5-/5  5-/5 Ankle Plantarflexion  5-/5  5-/5 Sensory: Sensation is slightly diminished on the left (baseline neuropathy) DTRs: 2+ and symmetric in the biceps and patellae.  Plantars: Toes are downgoing bilaterally.  Cerebellar: HKS intact bilaterally, rapid alternating movements normal.  FNF with dysmetria bilaterally Gait: Deferred for safety   LABS   I have reviewed labs in epic and the results pertinent to this consultation are:  Lab Results  Component Value Date   LDLCALC 114 (H) 06/24/2021   Lab Results  Component Value Date   ALT 19 01/31/2023   AST 18 01/31/2023   ALKPHOS 47 01/31/2023   BILITOT 0.5 01/31/2023   Lab Results  Component Value Date   HGBA1C 5.0 01/30/2023   Lab Results  Component Value Date   WBC 5.4 02/01/2023   HGB 11.4 (L) 02/01/2023   HCT 33.8 (L) 02/01/2023   MCV 86.7 02/01/2023   PLT 262 02/01/2023   Lab Results  Component Value Date   VITAMINB12 534 02/01/2022   Lab Results  Component Value Date   FOLATE >24.0 04/28/2020   Lab Results  Component Value Date   NA 137 02/01/2023   K 4.0 02/01/2023   CL 108 02/01/2023   CO2 22 02/01/2023   Pertinent Labs: CRP - 20.8 TSH - 4.817 Rheumatoid factor 15.0   Labs drawn at [facility] 3 years ago: Myeloperoxidase Ab 0.0 - 9.0 U/mL <9.0  ANCA Proteinase 3 0.0 - 3.5 U/mL <3.5  C-ANCA Neg:<1:20 titer <1:20  P-ANCA Neg:<1:20 titer <1:20  Comment: The presence of positive fluorescence exhibiting P-ANCA or C-ANCA patterns alone is not specific for the diagnosis of Wegener's Granulomatosis (WG)  or microscopic polyangiitis. Decisions about treatment should not be based solely on ANCA IFA results.  The International ANCA Group Consensus recommends follow up testing of positive sera with both PR-3 and MPO-ANCA enzyme immunoassays. As many as 5% serum samples are positive only by EIA. Ref. AM J Clin Pathol 1999;111:507-513.  Atypical pANCA Neg:<1:20 titer <1:20  B12: Normal    gG (Immunoglobin G), Serum 603 - 1,613 mg/dL 147     IgA/Immunoglobulin A, Serum 90 - 386 mg/dL 829     IgM (Immunoglobulin M), Srm 20 - 172 mg/dL 59     Total Protein 6.0 - 8.5 g/dL 6.4     Albumin SerPl Elph-Mcnc 2.9 - 4.4 g/dL 3.7  Alpha 1 0.0 - 0.4 g/dL 0.2     Alpha2 Glob SerPl Elph-Mcnc 0.4 - 1.0 g/dL 0.8     B-Globulin SerPl Elph-Mcnc 0.7 - 1.3 g/dL 1.1     Gamma Glob SerPl Elph-Mcnc 0.4 - 1.8 g/dL 0.6     M Protein SerPl Elph-Mcnc Not Observed g/dL Not Observed     Globulin, Total 2.2 - 3.9 g/dL 2.7     Albumin/Glob SerPl 0.7 - 1.7 1.4     IFE 1      Vitamin B6: Normal Vitamin E levels: Normal Thiamine: Normal  DIAGNOSTIC IMAGING/PROCEDURES   I have reviewed the images obtained:, as below    MRI brain 06/2018 - normal MRI brain    ASSESSMENT/PLAN    Assessment: 43 y.o. male with a past medical history of prediabetes, iron deficiency, peripheral neuropathy comes to the hospital with complaints of swelling and erythema initially was started on his right hand and progressed to versus bilateral upper extremity, chest wall, knees, upper back Started 4th of July in the morning. He woke up on pain and went to [facility] and [facility] ED. They diagnosed him with cellulitis and did abx and fluids. States he started feeling better and went home. That night it got worse. He noted blisters on arms and back, pain and itching in upper extremities. He follows with Dr. Drenda Freeze through [facility] who states that he has gone through extensive workup and has not been able to  diagnose him yet. [facility] started him on pamelor 25mg  nightly, ALA 600mg  daily, and recommend a punch biopsy for small fiber neuropathy.  EMGs were negative.She recommends he transfers to [facility] for management. He states that he has noticed muscle spasms on the left side of his face from his neck to the top of his head. This has improved since he started nortriptyline 3 weeks ago. States he is feeling better today that he has all weak but he is having a lot of itching in his feet.  - Neurological exam with diffuse weakness. - Current and prior labs: As documented above.   - Awaiting transfer to [facility] for Rheumatology     Recommendations: - Outpatient neurology recommends punch biopsy for small fiber neuropathy - Rheumatology management upon transfer to [facility] - Follow up with his established outpatient neurology and rheumatology through [facility]   --   Patient seen and examined by NP/APP with MD.   Elmer Picker, DNP, FNP-BC [facility] Pager: [phone]  I have seen and examined the patient. I have formulated the assessment and recommendations. 42 year old male followed by Rheumatology at [facility] for multiple complaints. Has seen Neurology outpatient at [facility] as well and has had negative EMG in the past. Presents with multiple somatic complaints. Exam with diffuse weakness. No findings consistent with a focal CNS lesion. May have a polyneuropathy. Will need transfer to [facility] for further management. Outpatient neurology recommends punch biopsy for small fiber neuropathy Electronically signed: Dr. Caryl Pina

## 2023-02-01 NOTE — Progress Notes (Signed)
PROGRESS NOTE    Joe Ray  ZOX:[id number] DOB: August 03, 1980 DOA: 01/29/2023 PCP: Pearline Cables, MD   Brief Narrative:  42 year old with history of prediabetes, iron deficiency, peripheral neuropathy comes to the hospital with complaints of swelling and erythema initially was started on his right hand and progressed to versus bilateral upper extremity, chest wall, knees, upper back.  Initially seen at [facility] 2 days prior to admission where ultrasound was performed and was negative.  Was diagnosed with cellulitis and discharged home.  Upon returning to ED he was noted to have SIRS with evidence of tachycardia, leukocytosis, hypotension and elevated inflammatory markers.  Due to concerns of ongoing rheumatologic/dermatology process patient was attempted to transfer at [facility] and [facility] but was unsuccessful due to no bed availability.  Patient was admitted to the hospital with infectious disease consult and started on broad-spectrum antibiotics.  Multiple autoimmune workup has been sent as well.   Assessment & Plan:  Principal Problem:   Rash Active Problems:   Severe sepsis (HCC)   Fever   Myalgia   Hypotension   MRSA colonization   Tachycardia   Small fiber neuropathy     Generalized rash, fever of unknown origin; improving SIRS -At this time differential is pretty broad concerning for infectious versus inflammatory/rheumatologic process going on.  No obvious mucosal involvement or airway compromise at this time. - Currently on vancomycin and cefepime.  ID is following - Blood cultures cultures-NGTD - Procalcitonin 1.85, CRP 20, sed rate 28. CK - normal - UA, HIV, cortisol-negative - Upper extremity ultrasound are negative - CT chest abdomen pelvis-mild pleural effusion - Hold off on steroids.  -Respiratory viral panel- neg.   HBA, EBV, CMV-reactive.  Discussed with ID.  No further workup Rheumatoid factor = 15 recommend second opinion from [facility]  A/P is negative  I spoke with Dr. Drenda Freeze [facility] Rheum, his primary Rheum-who mentioned the patient has gone through extensive workup with her and she has not been able to annual diagnosis.  Recommend to see if he can transfer him to [facility] for further management and workup.)  Will consult Neuro for their input here as well.   Hypokalemia/hypophosphatemia/hypomagnesemia - As needed repletion   Prediabetes A1c 5.0   Painful small fiber neuropathy Followed by neurology at [facility].    DVT prophylaxis: lovenox Code Status: Full code Family Communication:   Status is: Inpatient Currently awaiting transfer to [facility]       Diet Orders (From admission, onward)     Start     Ordered   01/30/23 1018  Diet vegetarian Room service appropriate? Yes; Fluid consistency: Thin  Diet effective now       Question Answer Comment  Room service appropriate? Yes   Fluid consistency: Thin      01/30/23 1017            Subjective: Still having pain and weakness in bilateral upper and lower extremity.  Examination:  General exam: Appears calm and comfortable  Respiratory system: Clear to auscultation. Respiratory effort normal. Cardiovascular system: S1 & S2 heard, RRR. No JVD, murmurs, rubs, gallops or clicks. No pedal edema. Gastrointestinal system: Abdomen is nondistended, soft and nontender. No organomegaly or masses felt. Normal bowel sounds heard. Central nervous system: Alert and oriented. No focal neurological deficits. Extremities: Symmetric 5 x 5 power. Skin: Erythema and blisters noted in his neck area = improving.     Psychiatry: Judgement and insight appear normal. Mood &  affect appropriate.    Objective: Vitals:   02/01/23 0405 02/01/23 0745 02/01/23 0945 02/01/23 1012  BP: (!) 103/59 109/71 116/82   Pulse: 92 94 80   Resp: 20 (!) 26 20   Temp: 98.2 F (36.8 C) 98.2 F (36.8 C) 98.4 F (36.9 C)   TempSrc: Oral Oral Oral   SpO2:  97% 98%    Weight:    81.2 kg  Height:        Intake/Output Summary (Last 24 hours) at 02/01/2023 1032 Last data filed at 02/01/2023 1012 Gross per 24 hour  Intake 480 ml  Output 1600 ml  Net -1120 ml   Filed Weights   01/29/23 1046 01/31/23 0346 02/01/23 1012  Weight: 75.3 kg 79.1 kg 81.2 kg    Scheduled Meds:  cefadroxil  500 mg Oral BID   enoxaparin (LOVENOX) injection  40 mg Subcutaneous Q24H   Continuous Infusions:    Nutritional status     Body mass index is 27.22 kg/m.  Data Reviewed:   CBC: Recent Labs  Lab 01/27/23 1531 01/29/23 1457 01/30/23 0413 01/31/23 0746 02/01/23 0252  WBC 12.1* 13.3* 10.6* 7.6 5.4  NEUTROABS 10.9* 11.1*  --  5.7  --   HGB 17.2* 14.4 13.1 10.5* 11.4*  HCT 50.5 41.0 39.3 31.4* 33.8*  MCV 84.4 83.7 84.2 84.2 86.7  PLT 283 291 277 256 262   Basic Metabolic Panel: Recent Labs  Lab 01/27/23 1531 01/29/23 1457 01/30/23 1017 01/31/23 0746 02/01/23 0252  NA 137 136 137 137 137  K 4.1 3.5 3.2* 3.1* 4.0  CL 104 103 108 109 108  CO2 25 24 21* 21* 22  GLUCOSE 114* 130* 100* 87 92  BUN 22* 20 14 12 11   CREATININE 0.81 0.78 0.79 0.76 0.71  CALCIUM 9.1 9.0 7.1* 7.2* 7.7*  MG  --   --  1.3* 2.0 2.2  PHOS  --   --  1.6* 2.2* 2.5   GFR: Estimated Creatinine Clearance: 117.6 mL/min (by C-G formula based on SCr of 0.71 mg/dL). Liver Function Tests: Recent Labs  Lab 01/29/23 1457 01/30/23 1017 01/31/23 0746  AST 11* 13* 18  ALT 20 16 19   ALKPHOS 57 41 47  BILITOT 0.5 0.6 0.5  PROT 6.0* 4.2* 4.6*  ALBUMIN 3.5 2.0* 2.0*   No results for input(s): "LIPASE", "AMYLASE" in the last 168 hours. No results for input(s): "AMMONIA" in the last 168 hours. Coagulation Profile: No results for input(s): "INR", "PROTIME" in the last 168 hours. Cardiac Enzymes: Recent Labs  Lab 01/31/23 1017  CKTOTAL 59   BNP (last 3 results) No results for input(s): "PROBNP" in the last 8760 hours. HbA1C: Recent Labs    01/30/23 0413  HGBA1C 5.0    CBG: Recent Labs  Lab 01/31/23 0958 02/01/23 0743  GLUCAP 82 90   Lipid Profile: No results for input(s): "CHOL", "HDL", "LDLCALC", "TRIG", "CHOLHDL", "LDLDIRECT" in the last 72 hours. Thyroid Function Tests: Recent Labs    01/31/23 0753  TSH 4.817*   Anemia Panel: No results for input(s): "VITAMINB12", "FOLATE", "FERRITIN", "TIBC", "IRON", "RETICCTPCT" in the last 72 hours. Sepsis Labs: Recent Labs  Lab 01/29/23 1457 01/29/23 1713 01/30/23 1017 01/30/23 1659  PROCALCITON  --   --  1.85  --   LATICACIDVEN 1.9 1.2 1.1 1.1    Recent Results (from the past 240 hour(s))  SARS Coronavirus 2 by RT PCR (hospital order, performed in [facility] hospital lab) *cepheid single result test* Anterior Nasal Swab  Status: None   Collection Time: 01/27/23  4:42 PM   Specimen: Anterior Nasal Swab  Result Value Ref Range Status   SARS Coronavirus 2 by RT PCR NEGATIVE NEGATIVE Final    Comment: (NOTE) SARS-CoV-2 target nucleic acids are NOT DETECTED.  The SARS-CoV-2 RNA is generally detectable in upper and lower respiratory specimens during the acute phase of infection. The lowest concentration of SARS-CoV-2 viral copies this assay can detect is 250 copies / mL. A negative result does not preclude SARS-CoV-2 infection and should not be used as the sole basis for treatment or other patient management decisions.  A negative result may occur with improper specimen collection / handling, submission of specimen other than nasopharyngeal swab, presence of viral mutation(s) within the areas targeted by this assay, and inadequate number of viral copies (<250 copies / mL). A negative result must be combined with clinical observations, patient history, and epidemiological information.  Fact Sheet for Patients:   RoadLapTop.co.za  Fact Sheet for Healthcare Providers: http://kim-miller.com/  This test is not yet approved or  cleared by the  [place] FDA and has been authorized for detection and/or diagnosis of SARS-CoV-2 by FDA under an Emergency Use Authorization (EUA).  This EUA will remain in effect (meaning this test can be used) for the duration of the COVID-19 declaration under Section 564(b)(1) of the Act, 21 U.S.C. section 360bbb-3(b)(1), unless the authorization is terminated or revoked sooner.  Performed at [facility], [address] [place], [place] [address]   Blood culture (routine x 2)     Status: None (Preliminary result)   Collection Time: 01/29/23  2:57 PM   Specimen: BLOOD RIGHT ARM  Result Value Ref Range Status   Specimen Description   Final    BLOOD RIGHT ARM Performed at [facility], [address] [place], [place] [address]    Special Requests   Final    BOTTLES DRAWN AEROBIC AND ANAEROBIC Blood Culture results may not be optimal due to an excessive volume of blood received in culture bottles Performed at [facility], [address] [place], [place] [address]    Culture   Final    NO GROWTH 3 DAYS Performed at [facility], [address], [place], [place] [address]    Report Status PENDING  Incomplete  Blood culture (routine x 2)     Status: None (Preliminary result)   Collection Time: 01/29/23  2:57 PM   Specimen: BLOOD LEFT ARM  Result Value Ref Range Status   Specimen Description   Final    BLOOD LEFT ARM Performed at [facility], [address], [place], [place] [address]    Special Requests   Final    BOTTLES DRAWN AEROBIC AND ANAEROBIC Blood Culture results may not be optimal due to an excessive volume of blood received in culture bottles Performed at [facility], [address], [place], [place] [address]    Culture   Final    NO GROWTH 3 DAYS Performed at [facility], [address] [place], [place] [address]    Report Status PENDING  Incomplete  Respiratory (~20 pathogens)  panel by PCR     Status: None   Collection Time: 01/31/23  7:35 AM   Specimen: Nasopharyngeal Swab; Respiratory  Result Value Ref Range Status   Adenovirus NOT DETECTED NOT DETECTED Final   Coronavirus 229E NOT DETECTED NOT DETECTED Final    Comment: (NOTE) The Coronavirus on  the Respiratory Panel, DOES NOT test for the novel  Coronavirus (2019 nCoV)    Coronavirus HKU1 NOT DETECTED NOT DETECTED Final   Coronavirus NL63 NOT DETECTED NOT DETECTED Final   Coronavirus OC43 NOT DETECTED NOT DETECTED Final   Metapneumovirus NOT DETECTED NOT DETECTED Final   Rhinovirus / Enterovirus NOT DETECTED NOT DETECTED Final   Influenza A NOT DETECTED NOT DETECTED Final   Influenza B NOT DETECTED NOT DETECTED Final   Parainfluenza Virus 1 NOT DETECTED NOT DETECTED Final   Parainfluenza Virus 2 NOT DETECTED NOT DETECTED Final   Parainfluenza Virus 3 NOT DETECTED NOT DETECTED Final   Parainfluenza Virus 4 NOT DETECTED NOT DETECTED Final   Respiratory Syncytial Virus NOT DETECTED NOT DETECTED Final   Bordetella pertussis NOT DETECTED NOT DETECTED Final   Bordetella Parapertussis NOT DETECTED NOT DETECTED Final   Chlamydophila pneumoniae NOT DETECTED NOT DETECTED Final   Mycoplasma pneumoniae NOT DETECTED NOT DETECTED Final    Comment: Performed at [facility], [address] [place], [place] [address]         Radiology Studies: CT CHEST ABDOMEN PELVIS W CONTRAST  Result Date: 01/30/2023 CLINICAL DATA:  Fever of unknown etiology. EXAM: CT CHEST, ABDOMEN, AND PELVIS WITH CONTRAST TECHNIQUE: Multidetector CT imaging of the chest, abdomen and pelvis was performed following the standard protocol during bolus administration of intravenous contrast. RADIATION DOSE REDUCTION: This exam was performed according to the departmental dose-optimization program which includes automated exposure control, adjustment of the mA and/or kV according to patient size and/or use of iterative reconstruction technique.  CONTRAST:  75mL OMNIPAQUE IOHEXOL 350 MG/ML SOLN COMPARISON:  Chest radiograph, 01/29/2023. FINDINGS: CT CHEST FINDINGS Cardiovascular: Heart is normal in size and configuration. No pericardial effusion. Normal great vessels. Mediastinum/Nodes: No enlarged mediastinal, hilar, or axillary lymph nodes. Thyroid gland, trachea, and esophagus demonstrate no significant findings. Lungs/Pleura: Trace pleural effusions. Mild bilateral interstitial thickening. Focal opacity along the superior aspect of the right oblique fissure consistent with fissural fluid. Minor dependent subsegmental atelectasis in the lower lobes. No lung consolidation to suggest pneumonia. No pneumothorax. Musculoskeletal: No chest wall mass or suspicious bone lesions identified. CT ABDOMEN PELVIS FINDINGS Hepatobiliary: No focal liver abnormality is seen. No gallstones, gallbladder wall thickening, or biliary dilatation. Pancreas: Unremarkable. No pancreatic ductal dilatation or surrounding inflammatory changes. Spleen: Normal in size without focal abnormality. Adrenals/Urinary Tract: Normal adrenal glands. Kidneys normal in size, orientation and position with symmetric enhancement and excretion. 11 mm cyst, lower pole of the left kidney and 1 cm cyst, midpole of the right kidney. No follow-up indicated. No other renal masses, no stones and no hydronephrosis. Normal ureters. Normal bladder. Stomach/Bowel: Stomach is within normal limits. Appendix appears normal. No evidence of bowel wall thickening, distention, or inflammatory changes. Vascular/Lymphatic: No significant vascular findings are present. No enlarged abdominal or pelvic lymph nodes. Reproductive: Unremarkable. Other: Small fat containing umbilical hernia.  No ascites. Musculoskeletal: Normal. IMPRESSION: 1. Lungs demonstrate interstitial thickening and small bilateral effusions. Consider mild interstitial pulmonary edema in the proper clinical setting. No evidence of pneumonia. 2. No other  evidence of an acute abnormality within the chest, abdomen or pelvis. Electronically Signed   By: Amie Portland M.D.   On: 01/30/2023 15:35           LOS: 2 days   Time spent= 35 mins    Katelee Schupp Joline Maxcy, MD [facility]  If 7PM-7AM, please contact night-coverage  02/01/2023, 10:32 AM

## 2023-02-02 DIAGNOSIS — R21 Rash and other nonspecific skin eruption: Secondary | ICD-10-CM | POA: Diagnosis not present

## 2023-02-02 LAB — URINALYSIS, ROUTINE W REFLEX MICROSCOPIC
Bilirubin Urine: NEGATIVE
Glucose, UA: NEGATIVE mg/dL
Hgb urine dipstick: NEGATIVE
Ketones, ur: NEGATIVE mg/dL
Leukocytes,Ua: NEGATIVE
Nitrite: NEGATIVE
Protein, ur: NEGATIVE mg/dL
Specific Gravity, Urine: 1.008 (ref 1.005–1.030)
pH: 7 (ref 5.0–8.0)

## 2023-02-02 LAB — BASIC METABOLIC PANEL
Anion gap: 7 (ref 5–15)
BUN: 8 mg/dL (ref 6–20)
CO2: 23 mmol/L (ref 22–32)
Calcium: 8.3 mg/dL — ABNORMAL LOW (ref 8.9–10.3)
Chloride: 107 mmol/L (ref 98–111)
Creatinine, Ser: 0.74 mg/dL (ref 0.61–1.24)
GFR, Estimated: >60 mL/min (ref >60)
Glucose, Bld: 96 mg/dL (ref 70–99)
Potassium: 4 mmol/L (ref 3.5–5.1)
Sodium: 137 mmol/L (ref 135–145)

## 2023-02-02 LAB — CBC
HCT: 32.3 % — ABNORMAL LOW (ref 39.0–52.0)
Hemoglobin: 11.1 g/dL — ABNORMAL LOW (ref 13.0–17.0)
MCH: 29.3 pg (ref 26.0–34.0)
MCHC: 34.4 g/dL (ref 30.0–36.0)
MCV: 85.2 fL (ref 80.0–100.0)
Platelets: 296 10*3/uL (ref 150–400)
RBC: 3.79 MIL/uL — ABNORMAL LOW (ref 4.22–5.81)
RDW: 12.6 % (ref 11.5–15.5)
WBC: 5.4 10*3/uL (ref 4.0–10.5)
nRBC: 0 % (ref 0.0–0.2)

## 2023-02-02 LAB — MAGNESIUM: Magnesium: 2 mg/dL (ref 1.7–2.4)

## 2023-02-02 LAB — ALDOLASE: Aldolase: 3.8 U/L (ref 3.3–10.3)

## 2023-02-02 LAB — CULTURE, BLOOD (ROUTINE X 2)

## 2023-02-02 LAB — GLUCOSE, CAPILLARY: Glucose-Capillary: 84 mg/dL (ref 70–99)

## 2023-02-02 NOTE — Progress Notes (Signed)
PROGRESS NOTE    Joe Ray  ZOX:[id number] DOB: Jul 14, 1981 DOA: 01/29/2023 PCP: Pearline Cables, MD   Brief Narrative:  42 year old with history of prediabetes, iron deficiency, peripheral neuropathy comes to the hospital with complaints of swelling and erythema initially was started on his right hand and progressed to versus bilateral upper extremity, chest wall, knees, upper back.  [facility]ially seen [facility] 2 days prior to admission where ultrasound was performed and was negative.  Was diagnosed with cellulitis and discharged home.  Upon returning to ED he was noted to have SIRS with evidence of tachycardia, leukocytosis, hypotension and elevated inflammatory markers.  Due to concerns of ongoing rheumatologic/dermatology process patient was attempted to transfer at [facility] but was unsuccessful due to no bed availability.  Patient was admitted to the hospital with infectious disease consult and started on broad-spectrum antibiotics.  Multiple autoimmune workup has been sent as well.  Seen by neurology as well, recommending outpatient punch biopsy   Assessment & Plan:  Principal Problem:   Rash Active Problems:   Severe sepsis (HCC)   Fever   Myalgia   Hypotension   MRSA colonization   Tachycardia   Small fiber neuropathy     Generalized rash, fever of unknown origin; improving SIRS -At this time differential is pretty broad concerning for infectious versus inflammatory/rheumatologic process going on.  No obvious mucosal involvement or airway compromise at this time. - Per ID recommendation antibiotic switched to Duricef - Blood cultures cultures-NGTD - Procalcitonin 1.85, CRP 20, sed rate 28. CK - normal - UA, HIV, cortisol-negative - Upper extremity ultrasound are negative - CT chest abdomen pelvis-mild pleural effusion - Hold off on steroids.  -Respiratory viral panel- neg.  -Seen by neurology, recommending outpatient punch biopsy and transfer to [facility] for rheumatology eval  HBA, EBV, CMV-reactive.  Discussed with ID.  No further workup Rheumatoid factor = 15 recommend second opinion from [facility] S[facility]ogren A/P is negative  I spoke with Dr. Drenda Freeze [facility] Rheum, his primary Rheum-who mentioned the patient has gone through extensive workup with her and she has not been able to annual diagnosis.  Recommend to see if he can transfer him to [facility] for further management and workup.)  Urinary urgency - UA negative.  Advised nursing staff to do bladder scan   Hypokalemia/hypophosphatemia/hypomagnesemia - As needed repletion   Prediabetes A1c 5.0   Painful small fiber neuropathy Followed by neurology at [facility].    DVT prophylaxis: lovenox Code Status: Full code Family Communication:   Status is: Inpatient Awaiting transfer to [facility]     Diet Orders (From admission, onward)     Start     Ordered   01/30/23 1018  Diet vegetarian Room service appropriate? Yes; Fluid consistency: Thin  Diet effective now       Question Answer Comment  Room service appropriate? Yes   Fluid consistency: Thin      01/30/23 1017            Subjective: Seen and examined at bedside, tells me he feels slight improvement compared to yesterday was still having some pain and discomfort in his extremities. Overnight had some urinary urgency, UA was negative. Examination: Constitutional: Not in acute distress Respiratory: Clear to auscultation bilaterally Cardiovascular: Normal sinus rhythm, no rubs Abdomen: Nontender nondistended good bowel sounds Musculoskeletal: No edema noted Neurologic: CN 2-12 grossly intact.  And nonfocal Psychiatric: Normal judgment and insight. Alert and oriented x 3.  Normal mood.  Skin: Erythema and blisters noted in his neck area = improving.     Objective: Vitals:   02/01/23 1943 02/01/23 2316 02/02/23 0403 02/02/23 0538  BP: 109/78 105/77 (!) 102/55   Pulse: 83 78 69   Resp:       Temp: 97.9 F (36.6 C) (!) 97.5 F (36.4 C) 98.7 F (37.1 C)   TempSrc: Oral Oral Oral   SpO2: 97% 97% 92%   Weight:    77.5 kg  Height:        Intake/Output Summary (Last 24 hours) at 02/02/2023 0716 Last data filed at 02/02/2023 0436 Gross per 24 hour  Intake 480 ml  Output 3200 ml  Net -2720 ml   Filed Weights   01/31/23 0346 02/01/23 1012 02/02/23 0538  Weight: 79.1 kg 81.2 kg 77.5 kg    Scheduled Meds:  cefadroxil  500 mg Oral BID   enoxaparin (LOVENOX) injection  40 mg Subcutaneous Q24H   hydrocortisone cream   Topical TID   pantoprazole  40 mg Oral BID AC   Continuous Infusions:    Nutritional status     Body mass index is 25.98 kg/m.  Data Reviewed:   CBC: Recent Labs  Lab 01/27/23 1531 01/29/23 1457 01/30/23 0413 01/31/23 0746 02/01/23 0252 02/02/23 0203  WBC 12.1* 13.3* 10.6* 7.6 5.4 5.4  NEUTROABS 10.9* 11.1*  --  5.7  --   --   HGB 17.2* 14.4 13.1 10.5* 11.4* 11.1*  HCT 50.5 41.0 39.3 31.4* 33.8* 32.3*  MCV 84.4 83.7 84.2 84.2 86.7 85.2  PLT 283 291 277 256 262 296   Basic Metabolic Panel: Recent Labs  Lab 01/29/23 1457 01/30/23 1017 01/31/23 0746 02/01/23 0252 02/02/23 0203  NA 136 137 137 137 137  K 3.5 3.2* 3.1* 4.0 4.0  CL 103 108 109 108 107  CO2 24 21* 21* 22 23  GLUCOSE 130* 100* 87 92 96  BUN 20 14 12 11 8   CREATININE 0.78 0.79 0.76 0.71 0.74  CALCIUM 9.0 7.1* 7.2* 7.7* 8.3*  MG  --  1.3* 2.0 2.2 2.0  PHOS  --  1.6* 2.2* 2.5  --    GFR: Estimated Creatinine Clearance: 117.6 mL/min (by C-G formula based on SCr of 0.74 mg/dL). Liver Function Tests: Recent Labs  Lab 01/29/23 1457 01/30/23 1017 01/31/23 0746  AST 11* 13* 18  ALT 20 16 19   ALKPHOS 57 41 47  BILITOT 0.5 0.6 0.5  PROT 6.0* 4.2* 4.6*  ALBUMIN 3.5 2.0* 2.0*   No results for input(s): "LIPASE", "AMYLASE" in the last 168 hours. No results for input(s): "AMMONIA" in the last 168 hours. Coagulation Profile: No results for input(s): "INR", "PROTIME"  in the last 168 hours. Cardiac Enzymes: Recent Labs  Lab 01/31/23 1017  CKTOTAL 59   BNP (last 3 results) No results for input(s): "PROBNP" in the last 8760 hours. HbA1C: No results for input(s): "HGBA1C" in the last 72 hours.  CBG: Recent Labs  Lab 01/31/23 0958 02/01/23 0743  GLUCAP 82 90   Lipid Profile: No results for input(s): "CHOL", "HDL", "LDLCALC", "TRIG", "CHOLHDL", "LDLDIRECT" in the last 72 hours. Thyroid Function Tests: Recent Labs    01/31/23 0753  TSH 4.817*   Anemia Panel: No results for input(s): "VITAMINB12", "FOLATE", "FERRITIN", "TIBC", "IRON", "RETICCTPCT" in the last 72 hours. Sepsis Labs: Recent Labs  Lab 01/29/23 1457 01/29/23 1713 01/30/23 1017 01/30/23 1659  PROCALCITON  --   --  1.85  --  LATICACIDVEN 1.9 1.2 1.1 1.1    Recent Results (from the past 240 hour(s))  SARS Coronavirus 2 by RT PCR (hospital order, performed in [facility] hospital lab) *cepheid single result test* Anterior Nasal Swab     Status: None   Collection Time: 01/27/23  4:42 PM   Specimen: Anterior Nasal Swab  Result Value Ref Range Status   SARS Coronavirus 2 by RT PCR NEGATIVE NEGATIVE Final    Comment: (NOTE) SARS-CoV-2 target nucleic acids are NOT DETECTED.  The SARS-CoV-2 RNA is generally detectable in upper and lower respiratory specimens during the acute phase of infection. The lowest concentration of SARS-CoV-2 viral copies this assay can detect is 250 copies / mL. A negative result does not preclude SARS-CoV-2 infection and should not be used as the sole basis for treatment or other patient management decisions.  A negative result may occur with improper specimen collection / handling, submission of specimen other than nasopharyngeal swab, presence of viral mutation(s) within the areas targeted by this assay, and inadequate number of viral copies (<250 copies / mL). A negative result must be combined with clinical observations, patient history, and  epidemiological information.  Fact Sheet for Patients:   RoadLapTop.co.za  Fact Sheet for Healthcare Providers: http://kim-miller.com/  This test is not yet approved or  cleared by the [place] FDA and has been authorized for detection and/or diagnosis of SARS-CoV-2 by FDA under an Emergency Use Authorization (EUA).  This EUA will remain in effect (meaning this test can be used) for the duration of the COVID-19 declaration under Section 564(b)(1) of the Act, 21 U.S.C. section 360bbb-3(b)(1), unless the authorization is terminated or revoked sooner.  Performed at [facility], [address], [place], [place] [address]   Blood culture (routine x 2)     Status: None (Preliminary result)   Collection Time: 01/29/23  2:57 PM   Specimen: BLOOD RIGHT ARM  Result Value Ref Range Status   Specimen Description   Final    BLOOD RIGHT ARM Performed at [facility], [address], [place], [place] [address]    Special Requests   Final    BOTTLES DRAWN AEROBIC AND ANAEROBIC Blood Culture results may not be optimal due to an excessive volume of blood received in culture bottles Performed at [facility], [address] [place], [place] [address]    Culture   Final    NO GROWTH 4 DAYS Performed at [facility], [address], [place], [place] [address]    Report Status PENDING  Incomplete  Blood culture (routine x 2)     Status: None (Preliminary result)   Collection Time: 01/29/23  2:57 PM   Specimen: BLOOD LEFT ARM  Result Value Ref Range Status   Specimen Description   Final    BLOOD LEFT ARM Performed at [facility], [address], [place], [place] [address]    Special Requests   Final    BOTTLES DRAWN AEROBIC AND ANAEROBIC Blood Culture results may not be optimal due to an excessive volume of blood received in culture bottles Performed at [facility],  [address] [place], [place] [address]    Culture   Final    NO GROWTH 4 DAYS Performed at [facility], [address] [place], [place] [address]    Report Status PENDING  Incomplete  Respiratory (~20 pathogens) panel by PCR     Status: None   Collection Time:  01/31/23  7:35 AM   Specimen: Nasopharyngeal Swab; Respiratory  Result Value Ref Range Status   Adenovirus NOT DETECTED NOT DETECTED Final   Coronavirus 229E NOT DETECTED NOT DETECTED Final    Comment: (NOTE) The Coronavirus on the Respiratory Panel, DOES NOT test for the novel  Coronavirus (2019 nCoV)    Coronavirus HKU1 NOT DETECTED NOT DETECTED Final   Coronavirus NL63 NOT DETECTED NOT DETECTED Final   Coronavirus OC43 NOT DETECTED NOT DETECTED Final   Metapneumovirus NOT DETECTED NOT DETECTED Final   Rhinovirus / Enterovirus NOT DETECTED NOT DETECTED Final   Influenza A NOT DETECTED NOT DETECTED Final   Influenza B NOT DETECTED NOT DETECTED Final   Parainfluenza Virus 1 NOT DETECTED NOT DETECTED Final   Parainfluenza Virus 2 NOT DETECTED NOT DETECTED Final   Parainfluenza Virus 3 NOT DETECTED NOT DETECTED Final   Parainfluenza Virus 4 NOT DETECTED NOT DETECTED Final   Respiratory Syncytial Virus NOT DETECTED NOT DETECTED Final   Bordetella pertussis NOT DETECTED NOT DETECTED Final   Bordetella Parapertussis NOT DETECTED NOT DETECTED Final   Chlamydophila pneumoniae NOT DETECTED NOT DETECTED Final   Mycoplasma pneumoniae NOT DETECTED NOT DETECTED Final    Comment: Performed at [facility], [address] [place], [place] [address]         Radiology Studies: No results found.         LOS: 3 days   Time spent= 35 mins    Maryfrances Portugal Joline Maxcy, MD [facility]  If 7PM-7AM, please contact night-coverage  02/02/2023, 7:16 AM

## 2023-02-02 NOTE — Progress Notes (Signed)
Initial Nutrition Assessment  DOCUMENTATION CODES:   Not applicable  INTERVENTION:  Referral to outpatient nutrition education center Diet education with handouts Encourage good PO intake   NUTRITION DIAGNOSIS:   Inadequate oral intake related to decreased appetite as evidenced by per patient/family report.  GOAL:   Patient will meet greater than or equal to 90% of their needs  MONITOR:   PO intake, Labs, I & O's  REASON FOR ASSESSMENT:   Consult Assessment of nutrition requirement/status, Diet education  ASSESSMENT:   42 y.o. male presented to the ED with rash on BLU extremities. PMH includes GERD, iron-deficiency, and neuropathy. Pt admitted with rash.   Pt reports that he is a vegetarian, does not eat eggs or fish, but does consume milk. Pt reports that his appetite varies at home, some days he will be hungry and other days he will not be and can go hours without eating. Reports that his appetite has not been great here, but eating about 50% of the meals here. Is able to find plenty of options on the menu as well. He has tried a plant-based protein shake in the past, but is not something he does consistently.  Reports a normal intake of; Breakfast: toast w/ butter or oil or 1/2 bagel with cream cheese and tea Snack: yogurt or fruit Lunch: small salad with wrap or pasta  Dinner: traditional [place] meal   Pt expressed concern with his overall protein intake and the lack of iron, vitamin B12, and omega-3's. RD provided ways for pt to incorporate more protein into diet based on current PO intake. Handouts added to discharge instructions and discussed with pt. Pt agreeable to referral for outpatient education with dietitian.   Medications reviewed and include: Cefadroxil, Protonix Labs reviewed.   NUTRITION - FOCUSED PHYSICAL EXAM:  Deferred to follow-up.   Diet Order:   Diet Order             Diet vegetarian Room service appropriate? Yes; Fluid consistency: Thin   Diet effective now                  EDUCATION NEEDS:  Education needs have been addressed  Skin:  Skin Assessment: Reviewed RN Assessment  Last BM:  7/8  Height:  Ht Readings from Last 1 Encounters:  01/29/23 5\' 8"  (1.727 m)   Weight:  Wt Readings from Last 1 Encounters:  02/02/23 77.5 kg   Ideal Body Weight:  70 kg  BMI:  Body mass index is 25.98 kg/m.  Estimated Nutritional Needs:   Kcal:  1900-2100  Protein:  95-115 grams  Fluid:  >/= 1.9 L   Kirby Crigler RD, LDN Clinical Dietitian See [facility] for contact information.

## 2023-02-02 NOTE — Discharge Instructions (Addendum)
General, Healthful Vegetarian Nutrition Therapy (2022)  Many people choose to follow a vegetarian diet for a variety of reasons. If you are interested in switching to a vegetarian diet or need advice on how to follow such a meal plan, a registered dietitian nutritionist (RDN) can help you figure out which foods will best meet your personal nutrition needs and preferences. There are several health benefits to following a vegetarian diet: Depending on your food choices, it could mean less calories, less salt, less added sugars, and less saturated fat and trans fat than many other diets. When you focus on eating more fruits, vegetables, whole grains, legumes, nuts, and seeds, you may improve how much fiber, vitamins, and minerals you eat.  Types of Vegetarians You have some choices for a vegetarian diet that best fits your needs and preferences. These different types of vegetarian diets have many similarities and notable differences.   Type of Vegetarian Diet Plan What to Eat What Not to Eat  Lacto-ovo vegetarian Fruits Vegetables Milk Dairy foods (such as cheese, yogurt, and ice cream) Eggs Grains Legumes Nuts and seeds Meat Poultry Seafood Products made from meat, poultry, or seafood (such as gelatin, broths, gravy, and lard)  Lacto-vegetarian Fruits Vegetables Milk Dairy foods (such as cheese, yogurt, and ice cream) Grains Legumes Nuts and seeds Meat Poultry Seafood Products made from meat, poultry, or seafood (such as gelatin, broths, gravy, and lard Eggs  Vegan Fruits Vegetables Grains Legumes Nuts and seeds Nonmeat proteins (soy, tempeh) Meat Poultry Seafood Products made from meat, poultry, or seafood (such as gelatin, broths, gravy, and lard Eggs Milk Dairy foods (such as cheese, yogurt, and ice cream) Ingredients made from milk (including whey and casein) Honey   Tips All Vegetarian Diet Types: Eat mostly whole, minimally processed fruits, vegetables, and whole  grains Choose a variety of different colored fruits and vegetables every day, especially dark-green vegetables, red and orange vegetables, and beans and peas. Choose whole grains for at least half of each day's grain servings. Choices include whole wheat, brown rice, oats, barley, bulgur, and cornmeal. Use fortified nondairy "milks," like almond milk or soy milk. For protein, choose beans and peas, soy foods, seeds, nuts, and seed/nut butters. Choose heart-healthy fats, such as avocados, nuts, seeds, olives, olive oil and canola oil. Limit foods with added sugar, sodium, saturated fats, and trans fats.  Foods to Choose Include a variety of the following minimally processed whole foods, choosing a healthful balance of each at your meals within your energy needs to promote a healthy weight. Food Group Foods to Choose  Grains Whole grains, including whole wheat, barley, rye, buckwheat, corn, teff, quinoa, millet, amaranth, brown and wild rice, sorghum, and oats; focus on intact cooked whole grains Grain products, such as bread, rolls, prepared breakfast cereals, crackers, and pasta  Protein Foods Eggs* Beans, lentils, or peas Nuts and seeds such as peanuts, almonds, pistachios, and sunflower seeds Nut and seed butters such as peanut butter, almond butter, and sunflower seed butter Soy foods, such as tofu, tempeh, or soynuts Plant protein-based meat alternatives, such as veggie burgers and sausages  Dairy Milk*, yogurt*, cottage cheese*, and cheeses* Fortified soymilk, plant-based cheeses, plant-based yogurts  Vegetables A variety of fresh, frozen, and canned vegetables Avocados Vegetable juices  Fruits A variety of fresh, frozen, canned and dried fruits 100% fruit juice ( cup, limited to one serving per day)  Oils and Fats Vegetable oils, including olive, peanut, and canola oils Vegetable oil-based margarines and spreads Use in  moderation, up to five servings per day (dairy and egg-free  margarine, salad dressing, and mayonnaise may be chosen for various vegetarian diet patterns)  *Note: A vegan diet does not include asterisked foods. A lacto-vegetarian diet includes dairy foods but not eggs, and a lacto-ovo vegetarian diet includes eggs and dairy foods.  Ovovegetarian Sample 1-Day Menu View Nutrient Info Breakfast 2 scrambled eggs 1 cup oatmeal 1 banana 1 slice whole-grain toast 1 cup soymilk fortified with calcium, vitamin B12, and vitamin D  Lunch 1 cup vegetarian chili 1 ounce cornbread 1 teaspoon margarine 4 celery sticks 1 apple 1 cup soymilk fortified with calcium, vitamin B12, and vitamin D  Afternoon Snack 1 cup soy yogurt 1 ounce mixed nuts  Evening Meal 1  cups vegetables, stir-fried  cup tofu, stir-fried 1 cup cooked brown rice 1 cup cantaloupe 1 cup carrot juice    Iron Deficiency Anemia Nutrition Therapy (2022)  Iron helps carry oxygen throughout your body. If you are not eating enough iron-rich foods in your diet, you may feel tired and run-down. How Much Iron Do You Need? The amount of iron you need each day is measured in milligrams (mg). The general recommendations for healthy people are: Women (ages 5-50 years): 18 mg iron per day Women (ages 35-50 years): 27 mg if pregnant; 9 mg if breastfeeding Men (ages 31 years and older): 8 mg iron per day Older women (ages 34 years and older): 8 mg iron per day  Tips Tips for Adding Iron to Your Eating Plan Iron from meat, fish, and poultry is better absorbed than iron from plants. Include foods high in vitamin C such as citrus juice and fruits, melons, dark green leafy vegetables, and potatoes with your meals. This may help your body absorb more iron. Eat enriched or fortified grain products. Limit coffee and tea at meal times so as not to decrease iron absorption. Some cereals contain 18 mg iron per serving (such iron-fortified bran or whole grain).   Foods High in Iron (more than 2  milligrams) Food Group Food Serving  [employer], biscuit, English muffin, or bran muffin 4 inches   Cereals: 100% iron-fortified bran or whole grain  cup   Cereal, other dry  cup   Hot cereals:  instant grits, instant oatmeal, cream of rice, or cream of wheat  cup   Pasta made from garbanzo beans or lentils 2 ounces   Pretzels 2 ounces  Protein Foods Beans:  baked beans with pork, cannellini, great northern, kidney, lima, or white  cup   Lentils  cup   Liver, beef or chicken 3 ounces   Meats: beef, lamb, or pork 3 ounces   Pumpkin seeds  cup   Seafood: clams, oysters, shrimp, or sardines 3 ounces   Soybeans  cup   Tahini 2 tablespoons   Tempeh 1 cup   Veggie burger 1  Vegetables Potato with skin 1 medium   Spinach  cup cooked or 1 cup raw  Fruit Prunes 4  Foods Moderate in Iron (1-2 milligrams) Food Group Food Serving  Grains Bread, whole wheat 1 slice   Oatmeal, regular  cup   Pasta, egg enriched  cup   Pasta, whole grain 2 ounces   Tortilla, flour 1 each  Protein Foods Beans: vegetarian baked, black, garbanzo, or pinto  cup   Egg or egg substitute 1 large or  cup liquid   Fish: canned tuna or fresh or canned mackerel 3 ounces   Nuts: almonds, cashews,  mixed, pistachios, or walnuts  cup   Peas, dried and cooked  cup   Poultry: chicken or [place] 3 ounces   Seeds: sunflower, or sesame  cup   Tofu  cup  Dairy and Dairy Alternatives Soymilk 1 cup  Vegetables Asparagus 6 spears   Beets, brussels sprouts, mushrooms, or green peas  cup   Greens: collards, beet, kale, turnip, or Swiss chard 1 cup cooked   Pumpkin, canned  cup   Sauerkraut, canned  cup   Tomato sauce  cup  Fruit Apricots, dried 7 halves   Prune juice  cup   Raisins 5 tablespoons   Wheat germ 2 tablespoons   Iron Deficiency Anemia Sample 1-Day Menu View Nutrient Info Breakfast 1 serving citrus fruit 3/4 cup raisin bran cereal 1 egg 1 slice whole-wheat toast 1 tsp margarine  Lunch 3  oz tuna fish 1/2 cup carrots 1 medium apple 1 tsp mayonnaise 1/2 cup low-fat or nonfat milk  Afternoon Snack 1 cup nonfat yogurt 1/2 cup grapes 1 pear  Evening Meal 3 oz chicken 1 medium baked potato, with skin 1 tsp olive oil and vinegar dressing 1/2 cup low-fat or nonfat milk   Omega-3 Fatty Acids Foods List (2018)  Your body needs fat to function, but it can't create all needed fats on its own. One example is omega-3 fatty acid, an essential fat your body can get from food. Eating foods with omega-3 fatty acids keeps your brain, eyes, and heart healthy and helps protect your body against germs and bacteria.  The three types of omega-3 fatty acids are alpha-linolenic acid (ALA), eicosapentaenoic acid (EPA), and docosahexaenoic acid (DHA). ALA comes from plant oils, nuts, and seeds like flaxseed oil, walnuts, and chia seeds. EPA and DHA come from fish and seafood. Some foods are fortified with omega-3 fatty acids (like some eggs, milk, or soy beverages). Food manufacturers sometimes add omega-3 fatty acids or other nutrients to the product. Read the product label to find out if a food is fortified with omega-3 fatty acids.  Tips It's important to eat a variety of omega-3 fatty acid foods.  Your body gets more omega-3 fatty acids from EPA and DHA than ALA. Your registered dietitian nutritionist (RDN) will let you know how much ALA omega-3 fatty acid you need. Eating fish twice weekly will give you a healthy amount of EPA and DHA omega-3 fatty acids. Ask your primary care provider or RDN for the amount of DHA and EPA you need for your desired health benefit. If you do not eat the recommended amount of omega-3s in food, you can take omega-3 dietary supplements, such as fish oil, krill oil, or algal oil (for vegetarians/vegans). Your primary care provider or RDN can give you more information about these supplements. Omega-3 dietary supplements might interact with medications. Ask your  primary care provider or RDN if it is safe to take omega-3 supplements with your medication. Foods High in ALA Foods containing ALA are vegetarian and vegan friendly.   Food Serving Size Grams (g) of Omega-3 Fatty Acids  Flaxseed oil 1 tablespoon 7.3  Chia seeds 1 tablespoon 7.0  Flaxseed (ground) 1 tablespoon 1.6  Black walnuts  cup 1.7  Mixed nuts  cup 1.3  Canola oil 1 tablespoon 1.3  Soybean oil 1 tablespoon 0.9  Hemp seeds 1 tablespoon 0.9  Navy beans  cup 0.6  Edamame  cup 0.3  Kidney beans  cup 0.1    Foods High in [employer] Food Serving  Size Grams (g) of Omega-3 Fatty Acids  Herring fish 1 fillet (5 ounces) 3.1  Fish oil 1 tablespoon 2.9  Mackerel 1 fillet (4 ounces) 2.6  Salmon 3 ounces 1.5  Rainbow trout 1 fillet (5 ounces) 1.4  Halibut 3 ounces 1  Fish oil, cod liver 1 teaspoon 0.8  Tuna (canned) 3 ounces 0.7  Rockfish 1 fillet (5 ounces) 0.6  Shrimp  cup 0.4  Catfish 1 fillet (5 ounces) 0.3  Note that a 3-ounce piece of fish is about the size of a deck of cards

## 2023-02-02 NOTE — Progress Notes (Signed)
Received call from [facility] transfer center. Provided them with pertinent patient information. Notified that there is no available beds at this time.

## 2023-02-03 DIAGNOSIS — R21 Rash and other nonspecific skin eruption: Secondary | ICD-10-CM | POA: Diagnosis not present

## 2023-02-03 LAB — BASIC METABOLIC PANEL
Anion gap: 9 (ref 5–15)
BUN: 11 mg/dL (ref 6–20)
CO2: 24 mmol/L (ref 22–32)
Calcium: 8.6 mg/dL — ABNORMAL LOW (ref 8.9–10.3)
Chloride: 104 mmol/L (ref 98–111)
Creatinine, Ser: 0.74 mg/dL (ref 0.61–1.24)
GFR, Estimated: >60 mL/min (ref >60)
Glucose, Bld: 89 mg/dL (ref 70–99)
Potassium: 3.8 mmol/L (ref 3.5–5.1)
Sodium: 137 mmol/L (ref 135–145)

## 2023-02-03 LAB — CBC
HCT: 36.6 % — ABNORMAL LOW (ref 39.0–52.0)
Hemoglobin: 12.5 g/dL — ABNORMAL LOW (ref 13.0–17.0)
MCH: 28.2 pg (ref 26.0–34.0)
MCHC: 34.2 g/dL (ref 30.0–36.0)
MCV: 82.4 fL (ref 80.0–100.0)
Platelets: 354 10*3/uL (ref 150–400)
RBC: 4.44 MIL/uL (ref 4.22–5.81)
RDW: 12.4 % (ref 11.5–15.5)
WBC: 5.8 10*3/uL (ref 4.0–10.5)
nRBC: 0 % (ref 0.0–0.2)

## 2023-02-03 LAB — GLUCOSE, CAPILLARY: Glucose-Capillary: 95 mg/dL (ref 70–99)

## 2023-02-03 LAB — MAGNESIUM: Magnesium: 2.1 mg/dL (ref 1.7–2.4)

## 2023-02-03 MED ORDER — CEFADROXIL 500 MG PO CAPS: 500.0000 mg | ORAL_CAPSULE | Freq: Two times a day (BID) | ORAL | 0 refills | Status: AC

## 2023-02-03 NOTE — Progress Notes (Signed)
Report given to [facility] at [facility]. PIV to be left in place.

## 2023-02-03 NOTE — Progress Notes (Signed)
[facility] called this nurse to notify of transfer acceptance, [facility] notified. [facility] to set up transportation.

## 2023-02-03 NOTE — Progress Notes (Signed)
[facility] called for an update if there's a change on the plan of transferring and said that up to this time there's no bed available. Replied that plan is still to pursue transfer and needs to be still on the waitlist.

## 2023-02-03 NOTE — Progress Notes (Signed)
PROGRESS NOTE    Joe Ray  ZOX:[id number] DOB: 28-Aug-1980 DOA: 01/29/2023 PCP: Pearline Cables, MD   Brief Narrative:  42 year old with history of prediabetes, iron deficiency, peripheral neuropathy comes to the hospital with complaints of swelling and erythema initially was started on his right hand and progressed to versus bilateral upper extremity, chest wall, knees, upper back.  [facility]ially [facility] 2 days prior to admission where ultrasound was performed and was negative.  Was diagnosed with cellulitis and discharged home.  Upon returning to ED he was noted to have SIRS with evidence of tachycardia, leukocytosis, hypotension and elevated inflammatory markers.  Due to concerns of ongoing rheumatologic/dermatology process patient was attempted to transfer at [facility] but was unsuccessful due to no bed availability.  Patient was admitted to the hospital with infectious disease consult and started on broad-spectrum antibiotics.  Multiple autoimmune workup has been sent as well.  Seen by neurology as well, recommending outpatient punch biopsy.  Currently patient is awaiting transfer to [facility]/week.   Assessment & Plan:  Principal Problem:   Rash Active Problems:   Severe sepsis (HCC)   Fever   Myalgia   Hypotension   MRSA colonization   Tachycardia   Small fiber neuropathy     Generalized rash, fever of unknown origin; improving SIRS -At this time differential is pretty broad concerning for infectious versus inflammatory/rheumatologic process going on.  No obvious mucosal involvement or airway compromise at this time. - Per ID recommendation antibiotic switched to Duricef - Blood cultures cultures-NGTD - Procalcitonin 1.85, CRP 20, sed rate 28. CK - normal - UA, HIV, cortisol-negative - Upper extremity ultrasound are negative - CT chest abdomen pelvis-mild pleural effusion - Hold off on steroids.  -Respiratory viral panel- neg.  -Seen by neurology,  recommending outpatient punch biopsy and transfer to [facility] for rheumatology eval.  Awaiting transfer.  HBA, EBV, CMV-reactive.  Discussed with ID.  No further workup Rheumatoid factor = 15 recommend second opinion from [facility] A/[facility] is negative  I spoke with Dr. Drenda Freeze [facility] Rheum, his primary Rheum-who mentioned the patient has gone through extensive workup with her and she has not been able to annual diagnosis.  Recommend to see if he can transfer him to [facility] for further management and workup.)  Urinary urgency - UA negative.  Advised nursing staff to do bladder scan   Hypokalemia/hypophosphatemia/hypomagnesemia - As needed repletion   Prediabetes A1c 5.0   Painful small fiber neuropathy Followed by neurology at [facility].    DVT prophylaxis: lovenox Code Status: Full code Family Communication:   Status is: Inpatient Awaiting transfer to [facility]     Diet Orders (From admission, onward)     Start     Ordered   01/30/23 1018  Diet vegetarian Room service appropriate? Yes; Fluid consistency: Thin  Diet effective now       Question Answer Comment  Room service appropriate? Yes   Fluid consistency: Thin      01/30/23 1017            Subjective: Off-and-on having various symptoms such as stinging pain in his legs, itching in his hands, urinary urgency, bilateral upper extremity pain in his palm.  Examination: Constitutional: Not in acute distress Respiratory: Clear to auscultation bilaterally Cardiovascular: Normal sinus rhythm, no rubs Abdomen: Nontender nondistended good bowel sounds Musculoskeletal: No edema noted Skin: Rash on his neck is significantly improving.  Picture below from 7/10 Neurologic:  CN 2-12 grossly intact.  And nonfocal Psychiatric: Normal judgment and insight. Alert and oriented x 3. Normal mood.  Skin: Erythema and blisters noted in his neck area = improving.     Objective: Vitals:   02/02/23  1915 02/02/23 2326 02/03/23 0258 02/03/23 0805  BP: 116/85 121/77 96/69 120/84  Pulse:    70  Resp:    17  Temp: 98 F (36.7 C) (!) 97.3 F (36.3 C) 98.3 F (36.8 C) 98.5 F (36.9 C)  TempSrc: Oral Oral Oral Oral  SpO2: 97%     Weight:      Height:        Intake/Output Summary (Last 24 hours) at 02/03/2023 1018 Last data filed at 02/03/2023 0750 Gross per 24 hour  Intake 240 ml  Output 2350 ml  Net -2110 ml   Filed Weights   01/31/23 0346 02/01/23 1012 02/02/23 0538  Weight: 79.1 kg 81.2 kg 77.5 kg    Scheduled Meds:  cefadroxil  500 mg Oral BID   enoxaparin (LOVENOX) injection  40 mg Subcutaneous Q24H   hydrocortisone cream   Topical TID   pantoprazole  40 mg Oral BID AC   Continuous Infusions:    Nutritional status Signs/Symptoms: per patient/family report Interventions: Education Body mass index is 25.98 kg/m.  Data Reviewed:   CBC: Recent Labs  Lab 01/27/23 1531 01/29/23 1457 01/30/23 0413 01/31/23 0746 02/01/23 0252 02/02/23 0203 02/03/23 0651  WBC 12.1* 13.3* 10.6* 7.6 5.4 5.4 5.8  NEUTROABS 10.9* 11.1*  --  5.7  --   --   --   HGB 17.2* 14.4 13.1 10.5* 11.4* 11.1* 12.5*  HCT 50.5 41.0 39.3 31.4* 33.8* 32.3* 36.6*  MCV 84.4 83.7 84.2 84.2 86.7 85.2 82.4  PLT 283 291 277 256 262 296 354   Basic Metabolic Panel: Recent Labs  Lab 01/30/23 1017 01/31/23 0746 02/01/23 0252 02/02/23 0203 02/03/23 0651  NA 137 137 137 137 137  K 3.2* 3.1* 4.0 4.0 3.8  CL 108 109 108 107 104  CO2 21* 21* 22 23 24   GLUCOSE 100* 87 92 96 89  BUN 14 12 11 8 11   CREATININE 0.79 0.76 0.71 0.74 0.74  CALCIUM 7.1* 7.2* 7.7* 8.3* 8.6*  MG 1.3* 2.0 2.2 2.0 2.1  PHOS 1.6* 2.2* 2.5  --   --    GFR: Estimated Creatinine Clearance: 117.6 mL/min (by C-G formula based on SCr of 0.74 mg/dL). Liver Function Tests: Recent Labs  Lab 01/29/23 1457 01/30/23 1017 01/31/23 0746  AST 11* 13* 18  ALT 20 16 19   ALKPHOS 57 41 47  BILITOT 0.5 0.6 0.5  PROT 6.0* 4.2* 4.6*   ALBUMIN 3.5 2.0* 2.0*   No results for input(s): "LIPASE", "AMYLASE" in the last 168 hours. No results for input(s): "AMMONIA" in the last 168 hours. Coagulation Profile: No results for input(s): "INR", "PROTIME" in the last 168 hours. Cardiac Enzymes: Recent Labs  Lab 01/31/23 1017  CKTOTAL 59   BNP (last 3 results) No results for input(s): "PROBNP" in the last 8760 hours. HbA1C: No results for input(s): "HGBA1C" in the last 72 hours.  CBG: Recent Labs  Lab 01/31/23 0958 02/01/23 0743 02/02/23 0824 02/03/23 0807  GLUCAP 82 90 84 95   Lipid Profile: No results for input(s): "CHOL", "HDL", "LDLCALC", "TRIG", "CHOLHDL", "LDLDIRECT" in the last 72 hours. Thyroid Function Tests: No results for input(s): "TSH", "T4TOTAL", "FREET4", "T3FREE", "THYROIDAB" in the last 72 hours.  Anemia Panel: No results for input(s): "VITAMINB12", "  FOLATE", "FERRITIN", "TIBC", "IRON", "RETICCTPCT" in the last 72 hours. Sepsis Labs: Recent Labs  Lab 01/29/23 1457 01/29/23 1713 01/30/23 1017 01/30/23 1659  PROCALCITON  --   --  1.85  --   LATICACIDVEN 1.9 1.2 1.1 1.1    Recent Results (from the past 240 hour(s))  SARS Coronavirus 2 by RT PCR (hospital order, performed in [facility] hospital lab) *cepheid single result test* Anterior Nasal Swab     Status: None   Collection Time: 01/27/23  4:42 PM   Specimen: Anterior Nasal Swab  Result Value Ref Range Status   SARS Coronavirus 2 by RT PCR NEGATIVE NEGATIVE Final    Comment: (NOTE) SARS-CoV-2 target nucleic acids are NOT DETECTED.  The SARS-CoV-2 RNA is generally detectable in upper and lower respiratory specimens during the acute phase of infection. The lowest concentration of SARS-CoV-2 viral copies this assay can detect is 250 copies / mL. A negative result does not preclude SARS-CoV-2 infection and should not be used as the sole basis for treatment or other patient management decisions.  A negative result may occur with improper  specimen collection / handling, submission of specimen other than nasopharyngeal swab, presence of viral mutation(s) within the areas targeted by this assay, and inadequate number of viral copies (<250 copies / mL). A negative result must be combined with clinical observations, patient history, and epidemiological information.  Fact Sheet for Patients:   RoadLapTop.co.za  Fact Sheet for Healthcare Providers: http://kim-miller.com/  This test is not yet approved or  cleared by the [place] FDA and has been authorized for detection and/or diagnosis of SARS-CoV-2 by FDA under an Emergency Use Authorization (EUA).  This EUA will remain in effect (meaning this test can be used) for the duration of the COVID-19 declaration under Section 564(b)(1) of the Act, 21 U.S.C. section 360bbb-3(b)(1), unless the authorization is terminated or revoked sooner.  Performed at [facility], [address] [place], [place] [address]   Blood culture (routine x 2)     Status: None   Collection Time: 01/29/23  2:57 PM   Specimen: BLOOD RIGHT ARM  Result Value Ref Range Status   Specimen Description   Final    BLOOD RIGHT ARM Performed at [facility], [address], [place], [place] [address]    Special Requests   Final    BOTTLES DRAWN AEROBIC AND ANAEROBIC Blood Culture results may not be optimal due to an excessive volume of blood received in culture bottles Performed at [facility], [address] [place], [place] [address]    Culture   Final    NO GROWTH 5 DAYS Performed at [facility], [address] [place], [place] [address]    Report Status 02/03/2023 FINAL  Final  Blood culture (routine x 2)     Status: None   Collection Time: 01/29/23  2:57 PM   Specimen: BLOOD LEFT ARM  Result Value Ref Range Status   Specimen Description   Final    BLOOD LEFT ARM Performed at [facility], [address], [place], [place] [address]    Special Requests   Final    BOTTLES DRAWN AEROBIC AND ANAEROBIC Blood Culture results may not be optimal due to an excessive volume of blood received in culture bottles Performed at [facility], [address] [place], [place] [address]    Culture   Final    NO GROWTH 5 DAYS Performed at [facility], [address], [place], [place] [address]    Report Status 02/03/2023 FINAL  Final  Respiratory (~20 pathogens) panel by PCR     Status: None   Collection Time: 01/31/23  7:35 AM   Specimen: Nasopharyngeal Swab; Respiratory  Result Value Ref Range Status   Adenovirus NOT DETECTED NOT DETECTED Final   Coronavirus 229E NOT DETECTED NOT DETECTED Final    Comment: (NOTE) The Coronavirus on the Respiratory Panel, DOES NOT test for the novel  Coronavirus (2019 nCoV)    Coronavirus HKU1 NOT DETECTED NOT DETECTED Final   Coronavirus NL63 NOT DETECTED NOT DETECTED Final   Coronavirus OC43 NOT DETECTED NOT DETECTED Final   Metapneumovirus NOT DETECTED NOT DETECTED Final   Rhinovirus / Enterovirus NOT DETECTED NOT DETECTED Final   Influenza A NOT DETECTED NOT DETECTED Final   Influenza B NOT DETECTED NOT DETECTED Final   Parainfluenza Virus 1 NOT DETECTED NOT DETECTED Final   Parainfluenza Virus 2 NOT DETECTED NOT DETECTED Final   Parainfluenza Virus 3 NOT DETECTED NOT DETECTED Final   Parainfluenza Virus 4 NOT DETECTED NOT DETECTED Final   Respiratory Syncytial Virus NOT DETECTED NOT DETECTED Final   Bordetella pertussis NOT DETECTED NOT DETECTED Final   Bordetella Parapertussis NOT DETECTED NOT DETECTED Final   Chlamydophila pneumoniae NOT DETECTED NOT DETECTED Final   Mycoplasma pneumoniae NOT DETECTED NOT DETECTED Final    Comment: Performed at [facility], [address] [place], [place] [address]         Radiology Studies: No results found.         LOS: 4 days   Time spent= 35  mins    Isa Kohlenberg Joline Maxcy, MD [facility]  If 7PM-7AM, please contact night-coverage  02/03/2023, 10:18 AM

## 2023-02-03 NOTE — Discharge Summary (Signed)
Physician Discharge Summary  Joe Ray VVO:[id number] DOB: 1981/02/15 DOA: 01/29/2023  PCP: Pearline Cables, MD  Admit date: 01/29/2023 Discharge date: 02/03/2023  Admitted From:Home Disposition:  [facility]  Recommendations for Outpatient Follow-up:  Transfer to [facility].    Discharge Condition: Stable CODE STATUS: Full Diet recommendation: Regular, vegetarian  Brief/Interim Summary: 42 year old with history of prediabetes, iron deficiency, peripheral neuropathy comes to the hospital with complaints of swelling and erythema initially was started on his right hand and progressed to versus bilateral upper extremity, chest wall, knees, upper back.  [facility] prior to admission where ultrasound was performed and was negative.  Was diagnosed with cellulitis and discharged home.  Upon returning to ED he was noted to have SIRS with evidence of tachycardia, leukocytosis, hypotension and elevated inflammatory markers.  Due to concerns of ongoing rheumatologic/dermatology process patient was attempted to transfer at [facility] but was unsuccessful due to no bed availability.  Patient was admitted to the hospital with infectious disease consult and started on broad-spectrum antibiotics.  Multiple autoimmune workup has been sent as well.  Seen by neurology as well, recommending outpatient punch biopsy.     Eventually patient was transferred to [facility].  Patient accepted by Dr. Frances Furbish.  Pertinent labs: CRP 20.8 Sed rate 28 TSH 4.8 Rheumatoid factor-15 , Hepatitis A antibody, mildly elevated IgG EBV and CMV= per ID nonspecific and nothing further to do Labs that have been negative-CK, aldolase, cortisol, ANA, Sjogren A, Sjogren B, RPR, hepatitis C, UA, blood cultures  CT chest abdomen pelvis, chest x-ray-overall unremarkable.  Lower extremity Dopplers are negative.   Body mass index is 25.98 kg/m.       Discharge  Diagnoses:  Principal Problem:   Rash Active Problems:   Severe sepsis (HCC)   Fever   Myalgia   Hypotension   MRSA colonization   Tachycardia   Small fiber neuropathy      Consultations: Neurology Infectious disease Curb sided outpatient rheumatology, his primary rheumatologist- Dr Tereso Newcomer    Discharge Exam: Vitals:   02/03/23 0805 02/03/23 1637  BP: 120/84 119/83  Pulse: 70 90  Resp: 17   Temp: 98.5 F (36.9 C) 98.3 F (36.8 C)  SpO2:     Vitals:   02/02/23 2326 02/03/23 0258 02/03/23 0805 02/03/23 1637  BP: 121/77 96/69 120/84 119/83  Pulse:   70 90  Resp:   17   Temp: (!) 97.3 F (36.3 C) 98.3 F (36.8 C) 98.5 F (36.9 C) 98.3 F (36.8 C)  TempSrc: Oral Oral Oral   SpO2:      Weight:      Height:         Discharge Instructions  Discharge Instructions     Amb Referral to Nutrition and Diabetic Education   Complete by: As directed       Allergies as of 02/03/2023       Reactions   Bactrim [sulfamethoxazole-trimethoprim] Other (See Comments)   Itching see doxy   Doxycycline Other (See Comments)   Itching in 2016 while on doxycycline and bactrim for facial abscess   Amoxicillin Diarrhea, Rash        Medication List     TAKE these medications    cefadroxil 500 MG capsule Commonly known as: DURICEF Take 1 capsule (500 mg total) by mouth 2 (two) times daily for 4 days.   hyoscyamine 0.125 MG SL tablet Commonly known as: LEVSIN SL Take  0.125 mg by mouth 4 (four) times daily as needed for cramping.   multivitamin tablet Take 1 tablet by mouth daily.   nortriptyline 25 MG capsule Commonly known as: PAMELOR Take 25 mg by mouth at bedtime.   tadalafil 5 MG tablet Commonly known as: CIALIS Take 5 mg by mouth daily.   Vitamin D-3 25 MCG (1000 UT) Caps Take 1,000 Units by mouth daily.        Allergies  Allergen Reactions   Bactrim [Sulfamethoxazole-Trimethoprim] Other (See Comments)    Itching see doxy   Doxycycline  Other (See Comments)    Itching in 2016 while on doxycycline and bactrim for facial abscess   Amoxicillin Diarrhea and Rash    You were cared for by a hospitalist during your hospital stay. If you have any questions about your discharge medications or the care you received while you were in the hospital after you are discharged, you can call the unit and asked to speak with the hospitalist on call if the hospitalist that took care of you is not available. Once you are discharged, your primary care physician will handle any further medical issues. Please note that no refills for any discharge medications will be authorized once you are discharged, as it is imperative that you return to your primary care physician (or establish a relationship with a primary care physician if you do not have one) for your aftercare needs so that they can reassess your need for medications and monitor your lab values.  You were cared for by a hospitalist during your hospital stay. If you have any questions about your discharge medications or the care you received while you were in the hospital after you are discharged, you can call the unit and asked to speak with the hospitalist on call if the hospitalist that took care of you is not available. Once you are discharged, your primary care physician will handle any further medical issues. Please note that NO REFILLS for any discharge medications will be authorized once you are discharged, as it is imperative that you return to your primary care physician (or establish a relationship with a primary care physician if you do not have one) for your aftercare needs so that they can reassess your need for medications and monitor your lab values.  Please request your Prim.MD to go over all Hospital Tests and Procedure/Radiological results at the follow up, please get all Hospital records sent to your Prim MD by signing hospital release before you go home.  Get CBC, CMP, 2 view Chest X  ray checked  by Primary MD during your next visit or SNF MD in 5-7 days ( we routinely change or add medications that can affect your baseline labs and fluid status, therefore we recommend that you get the mentioned basic workup next visit with your PCP, your PCP may decide not to get them or add new tests based on their clinical decision)  On your next visit with your primary care physician please Get Medicines reviewed and adjusted.  If you experience worsening of your admission symptoms, develop shortness of breath, life threatening emergency, suicidal or homicidal thoughts you must seek medical attention immediately by calling 911 or calling your MD immediately  if symptoms less severe.  You Must read complete instructions/literature along with all the possible adverse reactions/side effects for all the Medicines you take and that have been prescribed to you. Take any new Medicines after you have completely understood and accpet all the possible  adverse reactions/side effects.   Do not drive, operate heavy machinery, perform activities at heights, swimming or participation in water activities or provide baby sitting services if your were admitted for syncope or siezures until you have seen by Primary MD or a Neurologist and advised to do so again.  Do not drive when taking Pain medications.   Procedures/Studies: CT CHEST ABDOMEN PELVIS W CONTRAST  Result Date: 01/30/2023 CLINICAL DATA:  Fever of unknown etiology. EXAM: CT CHEST, ABDOMEN, AND PELVIS WITH CONTRAST TECHNIQUE: Multidetector CT imaging of the chest, abdomen and pelvis was performed following the standard protocol during bolus administration of intravenous contrast. RADIATION DOSE REDUCTION: This exam was performed according to the departmental dose-optimization program which includes automated exposure control, adjustment of the mA and/or kV according to patient size and/or use of iterative reconstruction technique. CONTRAST:  75mL  OMNIPAQUE IOHEXOL 350 MG/ML SOLN COMPARISON:  Chest radiograph, 01/29/2023. FINDINGS: CT CHEST FINDINGS Cardiovascular: Heart is normal in size and configuration. No pericardial effusion. Normal great vessels. Mediastinum/Nodes: No enlarged mediastinal, hilar, or axillary lymph nodes. Thyroid gland, trachea, and esophagus demonstrate no significant findings. Lungs/Pleura: Trace pleural effusions. Mild bilateral interstitial thickening. Focal opacity along the superior aspect of the right oblique fissure consistent with fissural fluid. Minor dependent subsegmental atelectasis in the lower lobes. No lung consolidation to suggest pneumonia. No pneumothorax. Musculoskeletal: No chest wall mass or suspicious bone lesions identified. CT ABDOMEN PELVIS FINDINGS Hepatobiliary: No focal liver abnormality is seen. No gallstones, gallbladder wall thickening, or biliary dilatation. Pancreas: Unremarkable. No pancreatic ductal dilatation or surrounding inflammatory changes. Spleen: Normal in size without focal abnormality. Adrenals/Urinary Tract: Normal adrenal glands. Kidneys normal in size, orientation and position with symmetric enhancement and excretion. 11 mm cyst, lower pole of the left kidney and 1 cm cyst, midpole of the right kidney. No follow-up indicated. No other renal masses, no stones and no hydronephrosis. Normal ureters. Normal bladder. Stomach/Bowel: Stomach is within normal limits. Appendix appears normal. No evidence of bowel wall thickening, distention, or inflammatory changes. Vascular/Lymphatic: No significant vascular findings are present. No enlarged abdominal or pelvic lymph nodes. Reproductive: Unremarkable. Other: Small fat containing umbilical hernia.  No ascites. Musculoskeletal: Normal. IMPRESSION: 1. Lungs demonstrate interstitial thickening and small bilateral effusions. Consider mild interstitial pulmonary edema in the proper clinical setting. No evidence of pneumonia. 2. No other evidence of an  acute abnormality within the chest, abdomen or pelvis. Electronically Signed   By: Amie Portland M.D.   On: 01/30/2023 15:35   DG Chest Portable 1 View  Result Date: 01/29/2023 CLINICAL DATA:  Fever.  Arm swelling. EXAM: PORTABLE CHEST 1 VIEW COMPARISON:  11/14/2019 FINDINGS: The cardiomediastinal contours are normal. The lungs are clear. Pulmonary vasculature is normal. No consolidation, pleural effusion, or pneumothorax. No acute osseous abnormalities are seen. IMPRESSION: No acute findings. Electronically Signed   By: Narda Rutherford M.D.   On: 01/29/2023 16:14   US Venous Img Upper Right (DVT Study)  Result Date: 01/27/2023 CLINICAL DATA:  Right upper extremity swelling and redness EXAM: RIGHT UPPER EXTREMITY VENOUS DOPPLER ULTRASOUND TECHNIQUE: Gray-scale sonography with graded compression, as well as color Doppler and duplex ultrasound were performed to evaluate the upper extremity deep venous system from the level of the subclavian vein and including the jugular, axillary, basilic, radial, ulnar and upper cephalic vein. Spectral Doppler was utilized to evaluate flow at rest and with distal augmentation maneuvers. COMPARISON:  None Available. FINDINGS: Contralateral Subclavian Vein: Respiratory phasicity is normal and symmetric with the symptomatic  side. No evidence of thrombus. Normal compressibility. Internal Jugular Vein: No evidence of thrombus. Normal compressibility, respiratory phasicity and response to augmentation. Subclavian Vein: No evidence of thrombus. Normal compressibility, respiratory phasicity and response to augmentation. Axillary Vein: No evidence of thrombus. Normal compressibility, respiratory phasicity and response to augmentation. Cephalic Vein: No evidence of thrombus. Normal compressibility, respiratory phasicity and response to augmentation. Basilic Vein: No evidence of thrombus. Normal compressibility, respiratory phasicity and response to augmentation. Brachial Veins: No  evidence of thrombus. Normal compressibility, respiratory phasicity and response to augmentation. Radial Veins: No evidence of thrombus. Normal compressibility, respiratory phasicity and response to augmentation. Ulnar Veins: No evidence of thrombus. Normal compressibility, respiratory phasicity and response to augmentation. Venous Reflux:  None visualized. Other Findings:  None visualized. IMPRESSION: No evidence of DVT within the right upper extremity. Electronically Signed   By: Jacob Moores M.D.   On: 01/27/2023 16:28     The results of significant diagnostics from this hospitalization (including imaging, microbiology, ancillary and laboratory) are listed below for reference.     Microbiology: Recent Results (from the past 240 hour(s))  SARS Coronavirus 2 by RT PCR (hospital order, performed in [facility] hospital lab) *cepheid single result test* Anterior Nasal Swab     Status: None   Collection Time: 01/27/23  4:42 PM   Specimen: Anterior Nasal Swab  Result Value Ref Range Status   SARS Coronavirus 2 by RT PCR NEGATIVE NEGATIVE Final    Comment: (NOTE) SARS-CoV-2 target nucleic acids are NOT DETECTED.  The SARS-CoV-2 RNA is generally detectable in upper and lower respiratory specimens during the acute phase of infection. The lowest concentration of SARS-CoV-2 viral copies this assay can detect is 250 copies / mL. A negative result does not preclude SARS-CoV-2 infection and should not be used as the sole basis for treatment or other patient management decisions.  A negative result may occur with improper specimen collection / handling, submission of specimen other than nasopharyngeal swab, presence of viral mutation(s) within the areas targeted by this assay, and inadequate number of viral copies (<250 copies / mL). A negative result must be combined with clinical observations, patient history, and epidemiological information.  Fact Sheet for Patients:    RoadLapTop.co.za  Fact Sheet for Healthcare Providers: http://kim-miller.com/  This test is not yet approved or  cleared by the [place] FDA and has been authorized for detection and/or diagnosis of SARS-CoV-2 by FDA under an Emergency Use Authorization (EUA).  This EUA will remain in effect (meaning this test can be used) for the duration of the COVID-19 declaration under Section 564(b)(1) of the Act, 21 U.S.C. section 360bbb-3(b)(1), unless the authorization is terminated or revoked sooner.  Performed at [facility], [address] [place], [place] [address]   Blood culture (routine x 2)     Status: None   Collection Time: 01/29/23  2:57 PM   Specimen: BLOOD RIGHT ARM  Result Value Ref Range Status   Specimen Description   Final    BLOOD RIGHT ARM Performed at [facility], [address] [place], [place] [address]    Special Requests   Final    BOTTLES DRAWN AEROBIC AND ANAEROBIC Blood Culture results may not be optimal due to an excessive volume of blood received in culture bottles Performed at [facility], [address], [place], [place] [address]    Culture   Final    NO GROWTH 5 DAYS Performed at [facility], [address] [place], [place] [address]    Report Status 02/03/2023 FINAL  Final  Blood culture (routine x 2)     Status: None   Collection Time: 01/29/23  2:57 PM   Specimen: BLOOD LEFT ARM  Result Value Ref Range Status   Specimen Description   Final    BLOOD LEFT ARM Performed at [facility], [address], [place], [place] [address]    Special Requests   Final    BOTTLES DRAWN AEROBIC AND ANAEROBIC Blood Culture results may not be optimal due to an excessive volume of blood received in culture bottles Performed at [facility], [address], [place], [place] [address]    Culture   Final    NO GROWTH 5  DAYS Performed at [facility], [address], [place], [place] [address]    Report Status 02/03/2023 FINAL  Final  Respiratory (~20 pathogens) panel by PCR     Status: None   Collection Time: 01/31/23  7:35 AM   Specimen: Nasopharyngeal Swab; Respiratory  Result Value Ref Range Status   Adenovirus NOT DETECTED NOT DETECTED Final   Coronavirus 229E NOT DETECTED NOT DETECTED Final    Comment: (NOTE) The Coronavirus on the Respiratory Panel, DOES NOT test for the novel  Coronavirus (2019 nCoV)    Coronavirus HKU1 NOT DETECTED NOT DETECTED Final   Coronavirus NL63 NOT DETECTED NOT DETECTED Final   Coronavirus OC43 NOT DETECTED NOT DETECTED Final   Metapneumovirus NOT DETECTED NOT DETECTED Final   Rhinovirus / Enterovirus NOT DETECTED NOT DETECTED Final   Influenza A NOT DETECTED NOT DETECTED Final   Influenza B NOT DETECTED NOT DETECTED Final   Parainfluenza Virus 1 NOT DETECTED NOT DETECTED Final   Parainfluenza Virus 2 NOT DETECTED NOT DETECTED Final   Parainfluenza Virus 3 NOT DETECTED NOT DETECTED Final   Parainfluenza Virus 4 NOT DETECTED NOT DETECTED Final   Respiratory Syncytial Virus NOT DETECTED NOT DETECTED Final   Bordetella pertussis NOT DETECTED NOT DETECTED Final   Bordetella Parapertussis NOT DETECTED NOT DETECTED Final   Chlamydophila pneumoniae NOT DETECTED NOT DETECTED Final   Mycoplasma pneumoniae NOT DETECTED NOT DETECTED Final    Comment: Performed at [facility], [address], [place], [place] [address]     Labs: BNP (last 3 results) No results for input(s): "BNP" in the last 8760 hours. Basic Metabolic Panel: Recent Labs  Lab 01/30/23 1017 01/31/23 0746 02/01/23 0252 02/02/23 0203 02/03/23 0651  NA 137 137 137 137 137  K 3.2* 3.1* 4.0 4.0 3.8  CL 108 109 108 107 104  CO2 21* 21* 22 23 24   GLUCOSE 100* 87 92 96 89  BUN 14 12 11 8 11   CREATININE 0.79 0.76 0.71 0.74 0.74  CALCIUM 7.1* 7.2* 7.7* 8.3* 8.6*  MG 1.3* 2.0 2.2 2.0 2.1   PHOS 1.6* 2.2* 2.5  --   --    Liver Function Tests: Recent Labs  Lab 01/29/23 1457 01/30/23 1017 01/31/23 0746  AST 11* 13* 18  ALT 20 16 19   ALKPHOS 57 41 47  BILITOT 0.5 0.6 0.5  PROT 6.0* 4.2* 4.6*  ALBUMIN 3.5 2.0* 2.0*   No results for input(s): "LIPASE", "AMYLASE" in the last 168 hours. No results for input(s): "AMMONIA" in the last 168 hours. CBC: Recent Labs  Lab 01/29/23 1457 01/30/23 0413 01/31/23 0746 02/01/23 0252 02/02/23 0203 02/03/23 0651  WBC 13.3* 10.6* 7.6 5.4 5.4 5.8  NEUTROABS  11.1*  --  5.7  --   --   --   HGB 14.4 13.1 10.5* 11.4* 11.1* 12.5*  HCT 41.0 39.3 31.4* 33.8* 32.3* 36.6*  MCV 83.7 84.2 84.2 86.7 85.2 82.4  PLT 291 277 256 262 296 354   Cardiac Enzymes: Recent Labs  Lab 01/31/23 1017  CKTOTAL 59   BNP: Invalid input(s): "POCBNP" CBG: Recent Labs  Lab 01/31/23 0958 02/01/23 0743 02/02/23 0824 02/03/23 0807  GLUCAP 82 90 84 95   D-Dimer No results for input(s): "DDIMER" in the last 72 hours. Hgb A1c No results for input(s): "HGBA1C" in the last 72 hours. Lipid Profile No results for input(s): "CHOL", "HDL", "LDLCALC", "TRIG", "CHOLHDL", "LDLDIRECT" in the last 72 hours. Thyroid function studies No results for input(s): "TSH", "T4TOTAL", "T3FREE", "THYROIDAB" in the last 72 hours.  Invalid input(s): "FREET3" Anemia work up No results for input(s): "VITAMINB12", "FOLATE", "FERRITIN", "TIBC", "IRON", "RETICCTPCT" in the last 72 hours. Urinalysis    Component Value Date/Time   COLORURINE STRAW (A) 02/02/2023 0426   APPEARANCEUR CLEAR 02/02/2023 0426   LABSPEC 1.008 02/02/2023 0426   PHURINE 7.0 02/02/2023 0426   GLUCOSEU NEGATIVE 02/02/2023 0426   HGBUR NEGATIVE 02/02/2023 0426   BILIRUBINUR NEGATIVE 02/02/2023 0426   KETONESUR NEGATIVE 02/02/2023 0426   PROTEINUR NEGATIVE 02/02/2023 0426   UROBILINOGEN 0.2 07/24/2022 1552   NITRITE NEGATIVE 02/02/2023 0426   LEUKOCYTESUR NEGATIVE 02/02/2023 0426   Sepsis  Labs Recent Labs  Lab 01/31/23 0746 02/01/23 0252 02/02/23 0203 02/03/23 0651  WBC 7.6 5.4 5.4 5.8   Microbiology Recent Results (from the past 240 hour(s))  SARS Coronavirus 2 by RT PCR (hospital order, performed in [facility] hospital lab) *cepheid single result test* Anterior Nasal Swab     Status: None   Collection Time: 01/27/23  4:42 PM   Specimen: Anterior Nasal Swab  Result Value Ref Range Status   SARS Coronavirus 2 by RT PCR NEGATIVE NEGATIVE Final    Comment: (NOTE) SARS-CoV-2 target nucleic acids are NOT DETECTED.  The SARS-CoV-2 RNA is generally detectable in upper and lower respiratory specimens during the acute phase of infection. The lowest concentration of SARS-CoV-2 viral copies this assay can detect is 250 copies / mL. A negative result does not preclude SARS-CoV-2 infection and should not be used as the sole basis for treatment or other patient management decisions.  A negative result may occur with improper specimen collection / handling, submission of specimen other than nasopharyngeal swab, presence of viral mutation(s) within the areas targeted by this assay, and inadequate number of viral copies (<250 copies / mL). A negative result must be combined with clinical observations, patient history, and epidemiological information.  Fact Sheet for Patients:   RoadLapTop.co.za  Fact Sheet for Healthcare Providers: http://kim-miller.com/  This test is not yet approved or  cleared by the [place] FDA and has been authorized for detection and/or diagnosis of SARS-CoV-2 by FDA under an Emergency Use Authorization (EUA).  This EUA will remain in effect (meaning this test can be used) for the duration of the COVID-19 declaration under Section 564(b)(1) of the Act, 21 U.S.C. section 360bbb-3(b)(1), unless the authorization is terminated or revoked sooner.  Performed at [facility], [address], [place], [place] [address]   Blood culture (routine x 2)     Status: None   Collection Time: 01/29/23  2:57 PM   Specimen: BLOOD RIGHT ARM  Result Value Ref Range Status   Specimen Description  Final    BLOOD RIGHT ARM Performed at [facility], [address], [place], [place] [address]    Special Requests   Final    BOTTLES DRAWN AEROBIC AND ANAEROBIC Blood Culture results may not be optimal due to an excessive volume of blood received in culture bottles Performed at [facility], [address], [place], [place] [address]    Culture   Final    NO GROWTH 5 DAYS Performed at [facility], [address], [place], [place] [address]    Report Status 02/03/2023 FINAL  Final  Blood culture (routine x 2)     Status: None   Collection Time: 01/29/23  2:57 PM   Specimen: BLOOD LEFT ARM  Result Value Ref Range Status   Specimen Description   Final    BLOOD LEFT ARM Performed at [facility], [address], [place], [place] [address]    Special Requests   Final    BOTTLES DRAWN AEROBIC AND ANAEROBIC Blood Culture results may not be optimal due to an excessive volume of blood received in culture bottles Performed at [facility], [address], [place], [place] [address]    Culture   Final    NO GROWTH 5 DAYS Performed at [facility], [address], [place], [place] [address]    Report Status 02/03/2023 FINAL  Final  Respiratory (~20 pathogens) panel by PCR     Status: None   Collection Time: 01/31/23  7:35 AM   Specimen: Nasopharyngeal Swab; Respiratory  Result Value Ref Range Status   Adenovirus NOT DETECTED NOT DETECTED Final   Coronavirus 229E NOT DETECTED NOT DETECTED Final    Comment: (NOTE) The Coronavirus on the Respiratory Panel, DOES NOT test for the novel  Coronavirus (2019 nCoV)    Coronavirus HKU1 NOT DETECTED NOT DETECTED Final   Coronavirus NL63 NOT DETECTED NOT DETECTED Final    Coronavirus OC43 NOT DETECTED NOT DETECTED Final   Metapneumovirus NOT DETECTED NOT DETECTED Final   Rhinovirus / Enterovirus NOT DETECTED NOT DETECTED Final   Influenza A NOT DETECTED NOT DETECTED Final   Influenza B NOT DETECTED NOT DETECTED Final   Parainfluenza Virus 1 NOT DETECTED NOT DETECTED Final   Parainfluenza Virus 2 NOT DETECTED NOT DETECTED Final   Parainfluenza Virus 3 NOT DETECTED NOT DETECTED Final   Parainfluenza Virus 4 NOT DETECTED NOT DETECTED Final   Respiratory Syncytial Virus NOT DETECTED NOT DETECTED Final   Bordetella pertussis NOT DETECTED NOT DETECTED Final   Bordetella Parapertussis NOT DETECTED NOT DETECTED Final   Chlamydophila pneumoniae NOT DETECTED NOT DETECTED Final   Mycoplasma pneumoniae NOT DETECTED NOT DETECTED Final    Comment: Performed at [facility], [address] [place], [place] [address]     Time coordinating discharge:  I have spent 35 minutes face to face with the patient and on the ward discussing the patients care, assessment, plan and disposition with other care givers. >50% of the time was devoted counseling the patient about the risks and benefits of treatment/Discharge disposition and coordinating care.   SIGNED:   Dimple Nanas, MD  [facility] 02/03/2023, 5:12 PM   If 7PM-7AM, please contact night-coverage

## 2023-02-07 ENCOUNTER — Telehealth: Payer: Self-pay

## 2023-02-07 NOTE — Transitions of Care (Post Inpatient/ED Visit) (Signed)
   02/07/2023  Name: Joe Ray MRN: [id number] DOB: June 25, 1981  Today's TOC FU Call Status: Today's TOC FU Call Status:: Unsuccessul Call (1st Attempt) Unsuccessful Call (1st Attempt) Date: 02/07/23  Attempted to reach the patient regarding the most recent Inpatient/ED visit.  Follow Up Plan: Additional outreach attempts will be made to reach the patient to complete the Transitions of Care (Post Inpatient/ED visit) call.   Signature Karena Addison, LPN [facility] Nurse Health Advisor Direct Dial [phone]

## 2023-02-08 NOTE — Transitions of Care (Post Inpatient/ED Visit) (Signed)
   02/08/2023  Name: Joe Ray MRN: [id number] DOB: [birth year]  Today's TOC FU Call Status: Today's TOC FU Call Status:: Unsuccessful Call (2nd Attempt) Unsuccessful Call (1st Attempt) Date: 02/07/23 Unsuccessful Call (2nd Attempt) Date: 02/08/23  Attempted to reach the patient regarding the most recent Inpatient/ED visit.  Follow Up Plan: Additional outreach attempts will be made to reach the patient to complete the Transitions of Care (Post Inpatient/ED visit) call.   Signature Karena Addison, LPN [facility] Nurse Health Advisor Direct Dial [phone]

## 2023-02-10 ENCOUNTER — Encounter: Payer: Self-pay | Admitting: Family

## 2023-02-10 ENCOUNTER — Other Ambulatory Visit: Payer: Self-pay | Admitting: Family

## 2023-02-10 ENCOUNTER — Inpatient Hospital Stay: Payer: Commercial Managed Care - PPO | Admitting: Family

## 2023-02-10 VITALS — BP 120/78 | HR 94 | Temp 98.0°F | Ht 68.0 in | Wt 167.6 lb

## 2023-02-10 DIAGNOSIS — R790 Abnormal level of blood mineral: Secondary | ICD-10-CM

## 2023-02-10 DIAGNOSIS — M791 Myalgia, unspecified site: Secondary | ICD-10-CM | POA: Diagnosis not present

## 2023-02-10 DIAGNOSIS — R7989 Other specified abnormal findings of blood chemistry: Secondary | ICD-10-CM

## 2023-02-10 DIAGNOSIS — D509 Iron deficiency anemia, unspecified: Secondary | ICD-10-CM

## 2023-02-10 DIAGNOSIS — R5383 Other fatigue: Secondary | ICD-10-CM

## 2023-02-10 LAB — CBC WITH DIFFERENTIAL/PLATELET
Basophils Absolute: 0.1 10*3/uL (ref 0.0–0.1)
Basophils Relative: 1 % (ref 0.0–3.0)
Eosinophils Absolute: 0.1 10*3/uL (ref 0.0–0.7)
Eosinophils Relative: 0.9 % (ref 0.0–5.0)
HCT: 43.1 % (ref 39.0–52.0)
Hemoglobin: 14.2 g/dL (ref 13.0–17.0)
Lymphocytes Relative: 25.5 % (ref 12.0–46.0)
Lymphs Abs: 1.5 10*3/uL (ref 0.7–4.0)
MCHC: 33 g/dL (ref 30.0–36.0)
MCV: 86.6 fl (ref 78.0–100.0)
Monocytes Absolute: 0.4 10*3/uL (ref 0.1–1.0)
Monocytes Relative: 7 % (ref 3.0–12.0)
Neutro Abs: 3.9 10*3/uL (ref 1.4–7.7)
Neutrophils Relative %: 65.6 % (ref 43.0–77.0)
Platelets: 432 10*3/uL — ABNORMAL HIGH (ref 150.0–400.0)
RBC: 4.98 Mil/uL (ref 4.22–5.81)
RDW: 13 % (ref 11.5–15.5)
WBC: 6 10*3/uL (ref 4.0–10.5)

## 2023-02-10 LAB — COMPREHENSIVE METABOLIC PANEL
ALT: 30 U/L (ref 0–53)
AST: 16 U/L (ref 0–37)
Albumin: 4.2 g/dL (ref 3.5–5.2)
Alkaline Phosphatase: 73 U/L (ref 39–117)
BUN: 19 mg/dL (ref 6–23)
CO2: 28 mEq/L (ref 19–32)
Calcium: 9.7 mg/dL (ref 8.4–10.5)
Chloride: 103 mEq/L (ref 96–112)
Creatinine, Ser: 0.77 mg/dL (ref 0.40–1.50)
GFR: 110.9 mL/min (ref >60.00)
Glucose, Bld: 121 mg/dL — ABNORMAL HIGH (ref 70–99)
Potassium: 4.3 mEq/L (ref 3.5–5.1)
Sodium: 139 mEq/L (ref 135–145)
Total Bilirubin: 0.4 mg/dL (ref 0.2–1.2)
Total Protein: 6.9 g/dL (ref 6.0–8.3)

## 2023-02-10 LAB — IBC + FERRITIN
Ferritin: 140.2 ng/mL (ref 22.0–322.0)
Iron: 109 ug/dL (ref 42–165)
Saturation Ratios: 27.6 % (ref 20.0–50.0)
TIBC: 394.8 ug/dL (ref 250.0–450.0)
Transferrin: 282 mg/dL (ref 212.0–360.0)

## 2023-02-10 LAB — C-REACTIVE PROTEIN: CRP: <1 mg/dL (ref 0.5–20.0)

## 2023-02-10 LAB — VITAMIN B12: Vitamin B-12: 676 pg/mL (ref 211–911)

## 2023-02-10 NOTE — Patient Instructions (Signed)
Please go ahead and call your rheumatologist to get scheduled with rheumatology;  I will also talk to Dr. Patsy Lager about the treatment plan;  Talk to your HR department about FMLA;

## 2023-02-10 NOTE — Transitions of Care (Post Inpatient/ED Visit) (Signed)
   02/10/2023  Name: Joe Ray MRN: [id number] DOB: 13-Jul-1981  Today's TOC FU Call Status: Today's TOC FU Call Status:: Successful TOC FU Call Competed Unsuccessful Call (1st Attempt) Date: 02/07/23 Unsuccessful Call (2nd Attempt) Date: 02/08/23 [facility] FU Call Complete Date: 02/10/23  Transition Care Management Follow-up Telephone Call Date of Discharge: 02/04/23 Discharge Facility: Other ([facility]) Name of Other ([facility]) Discharge Facility: [facility] Type of Discharge: Inpatient Admission Primary Inpatient Discharge Diagnosis:: rash How have you been since you were released from the hospital?: Same Any questions or concerns?: No  Items Reviewed: Did you receive and understand the discharge instructions provided?: Yes Medications obtained,verified, and reconciled?: Yes (Medications Reviewed) Any new allergies since your discharge?: No Dietary orders reviewed?: Yes Do you have support at home?: Yes People in Home: spouse  Medications Reviewed Today: Medications Reviewed Today     Reviewed by Karena Addison, LPN (Licensed Practical Nurse) on 02/10/23 at 1525  Med List Status: <None>   Medication Order Taking? Sig Documenting Provider Last Dose Status Informant  Cholecalciferol (VITAMIN D-3) 1000 units CAPS [id number] No Take 1,000 Units by mouth daily. [provider] Taking Active Self  colchicine 0.6 MG tablet [id number] No Take 0.6 mg by mouth daily. [provider] Taking Active   hyoscyamine (LEVSIN SL) 0.125 MG SL tablet [id number] No Take 0.125 mg by mouth 4 (four) times daily as needed for cramping. [provider] Taking Active Self  Multiple Vitamin (MULTIVITAMIN) tablet [id number] No Take 1 tablet by mouth daily. [provider] Taking Active Self  nortriptyline (PAMELOR) 25 MG capsule [id number] No Take 25 mg by mouth at bedtime. [provider] Taking Active Self  tadalafil (CIALIS) 5 MG tablet [id number] No Take 5 mg by  mouth daily. [provider] Taking Active Self            Home Care and Equipment/Supplies: Were Home Health Services Ordered?: NA Any new equipment or medical supplies ordered?: NA  Functional Questionnaire: Do you need assistance with bathing/showering or dressing?: No Do you need assistance with meal preparation?: No Do you need assistance with eating?: No Do you have difficulty maintaining continence: No Do you need assistance with getting out of bed/getting out of a chair/moving?: No Do you have difficulty managing or taking your medications?: No  Follow up appointments reviewed: PCP Follow-up appointment confirmed?: Yes Date of PCP follow-up appointment?: 02/10/23 Specialist Hospital Follow-up appointment confirmed?: No Reason Specialist Follow-Up Not Confirmed: Patient has Specialist Provider Number and will Call for Appointment Do you need transportation to your follow-up appointment?: No Do you understand care options if your condition(s) worsen?: Yes-patient verbalized understanding    SIGNATURE Karena Addison, LPN [facility] Nurse Health Advisor Direct Dial [phone]

## 2023-02-10 NOTE — Progress Notes (Signed)
Joe Ray is a 42 y.o. 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     colchicine 0.6 MG tablet Take 0.6 mg by mouth daily.     hyoscyamine (LEVSIN SL) 0.125 MG SL tablet Take 0.125 mg by mouth 4 (four) times daily as needed for cramping.     Multiple Vitamin (MULTIVITAMIN) tablet Take 1 tablet by mouth daily.     nortriptyline (PAMELOR) 25 MG capsule Take 25 mg by mouth at bedtime.     tadalafil (CIALIS) 5 MG tablet Take 5 mg by mouth daily.     No current facility-administered medications for this visit.    Allergies: Bactrim [sulfamethoxazole-trimethoprim], Doxycycline, and Amoxicillin  Past Medical History:  Diagnosis Date   Allergy     No past surgical history on file.  Family History  Problem Relation Age of Onset   Diabetes Father     Social History   Tobacco Use   Smoking status: Never   Smokeless tobacco: Never  Substance Use Topics   Alcohol use: Never    Subjective:   Patient was hospitalized at [facility] from 7/6-7/11 with fever, rash, myalgia, sepsis- was transferred to [facility] from 7/11-7/13; symptoms thought to be related to underlying rheumatology issue and referral was sent to his local rheumatologist; he is concerned about persisting fatigue/ "just not feeling well." Has felt that his heart rate has been elevated recently; he is currently tracking his temperature daily/ was started on Colchicine at time of  discharge; scheduled to see neuromuscular provider on Monday for further evaluation of small fiber neuropathy;   Objective:  Vitals:   02/10/23 1121  BP: 120/78  Pulse: 94  Temp: 98 F (36.7 C)  TempSrc: Oral  SpO2: 99%  Weight: 167 lb 9.6 oz (76 kg)  Height: 5\' 8"  (1.727 m)    General: Well developed, well nourished, in no acute distress  Skin : Warm and dry.  Head: Normocephalic and atraumatic  Lungs: Respirations unlabored; clear to auscultation bilaterally without wheeze, rales, rhonchi  CVS exam: normal rate and regular rhythm.  Musculoskeletal: No deformities; no active joint inflammation  Extremities: No edema, cyanosis, clubbing  Vessels: Symmetric bilaterally  Neurologic: Alert and oriented; speech intact; face symmetrical; moves all extremities well; CNII-XII intact without focal deficit   Assessment:  1. Other fatigue   2. Myalgia   3. Abnormal TSH   4. Low vitamin B12 level   5. Iron deficiency anemia, unspecified iron deficiency anemia type   6. Low serum ferritin level     Plan:  Will update labs today; he will call his rheumatologist to schedule hospital follow up; will update referral to hematology due to concerns for persisting low ferritin- is currently on oral iron supplement/ ? Absorption issue; Strict ER follow up; have also recommended that he consider scheduling follow up with PCP to discuss FMLA;   Time spent 35 minutes  No follow-ups on file.  Orders Placed This Encounter  Procedures   CBC with Differential/Platelet   Comp Met (CMET)   C-reactive protein   Thyroid Panel With TSH   B12   Vitamin B6   IBC + Ferritin   Ambulatory referral to Hematology / Oncology    Referral Priority:   Routine    Referral Type:   Consultation    Referral Reason:   Specialty Services Required    Number of Visits Requested:   1    Requested Prescriptions    No prescriptions requested or ordered in this encounter

## 2023-02-11 ENCOUNTER — Encounter: Payer: Self-pay | Admitting: Family

## 2023-02-11 LAB — ANA W/REFLEX IF POSITIVE: Anti Nuclear Antibody (ANA): NEGATIVE

## 2023-02-11 LAB — THYROID PANEL WITH TSH: TSH: 1.06 mIU/L (ref 0.40–4.50)

## 2023-02-14 LAB — THYROID PANEL WITH TSH
Free Thyroxine Index: 2.6 (ref 1.4–3.8)
T3 Uptake: 29 % (ref 22–35)
T4, Total: 8.8 ug/dL (ref 4.9–10.5)

## 2023-02-14 LAB — VITAMIN B6: Vitamin B6: 25.8 ng/mL — ABNORMAL HIGH (ref 2.1–21.7)

## 2023-02-25 ENCOUNTER — Inpatient Hospital Stay (HOSPITAL_BASED_OUTPATIENT_CLINIC_OR_DEPARTMENT_OTHER): Payer: Commercial Managed Care - PPO | Admitting: Hematology & Oncology

## 2023-02-25 ENCOUNTER — Other Ambulatory Visit: Payer: Self-pay

## 2023-02-25 ENCOUNTER — Inpatient Hospital Stay: Payer: Commercial Managed Care - PPO | Attending: Hematology & Oncology

## 2023-02-25 ENCOUNTER — Encounter: Payer: Self-pay | Admitting: Hematology & Oncology

## 2023-02-25 VITALS — BP 100/73 | HR 86 | Temp 98.3°F | Resp 18 | Ht 68.0 in | Wt 170.4 lb

## 2023-02-25 DIAGNOSIS — Z79899 Other long term (current) drug therapy: Secondary | ICD-10-CM | POA: Diagnosis not present

## 2023-02-25 DIAGNOSIS — R21 Rash and other nonspecific skin eruption: Secondary | ICD-10-CM | POA: Insufficient documentation

## 2023-02-25 DIAGNOSIS — E611 Iron deficiency: Secondary | ICD-10-CM

## 2023-02-25 DIAGNOSIS — R161 Splenomegaly, not elsewhere classified: Secondary | ICD-10-CM

## 2023-02-25 DIAGNOSIS — Z79620 Long term (current) use of immunosuppressive biologic: Secondary | ICD-10-CM | POA: Insufficient documentation

## 2023-02-25 DIAGNOSIS — R0602 Shortness of breath: Secondary | ICD-10-CM | POA: Insufficient documentation

## 2023-02-25 DIAGNOSIS — M359 Systemic involvement of connective tissue, unspecified: Secondary | ICD-10-CM | POA: Diagnosis present

## 2023-02-25 DIAGNOSIS — R5383 Other fatigue: Secondary | ICD-10-CM

## 2023-02-25 DIAGNOSIS — R002 Palpitations: Secondary | ICD-10-CM | POA: Diagnosis not present

## 2023-02-25 DIAGNOSIS — R760 Raised antibody titer: Secondary | ICD-10-CM | POA: Diagnosis not present

## 2023-02-25 DIAGNOSIS — G629 Polyneuropathy, unspecified: Secondary | ICD-10-CM

## 2023-02-25 LAB — CMP (CANCER CENTER ONLY)
ALT: 35 U/L (ref 0–44)
AST: 27 U/L (ref 15–41)
Albumin: 4.3 g/dL (ref 3.5–5.0)
Alkaline Phosphatase: 68 U/L (ref 38–126)
Anion gap: 9 (ref 5–15)
BUN: 13 mg/dL (ref 6–20)
CO2: 26 mmol/L (ref 22–32)
Calcium: 9.5 mg/dL (ref 8.9–10.3)
Chloride: 105 mmol/L (ref 98–111)
Creatinine: 0.81 mg/dL (ref 0.61–1.24)
GFR, Estimated: >60 mL/min (ref >60)
Glucose, Bld: 86 mg/dL (ref 70–99)
Potassium: 3.8 mmol/L (ref 3.5–5.1)
Sodium: 140 mmol/L (ref 135–145)
Total Bilirubin: 0.3 mg/dL (ref 0.3–1.2)
Total Protein: 6.9 g/dL (ref 6.5–8.1)

## 2023-02-25 LAB — CBC WITH DIFFERENTIAL (CANCER CENTER ONLY)
Abs Immature Granulocytes: 0.02 10*3/uL (ref 0.00–0.07)
Basophils Absolute: 0 10*3/uL (ref 0.0–0.1)
Basophils Relative: 1 %
Eosinophils Absolute: 0.1 10*3/uL (ref 0.0–0.5)
Eosinophils Relative: 2 %
HCT: 41.2 % (ref 39.0–52.0)
Hemoglobin: 13.5 g/dL (ref 13.0–17.0)
Immature Granulocytes: 0 %
Lymphocytes Relative: 29 %
Lymphs Abs: 1.6 10*3/uL (ref 0.7–4.0)
MCH: 28.1 pg (ref 26.0–34.0)
MCHC: 32.8 g/dL (ref 30.0–36.0)
MCV: 85.8 fL (ref 80.0–100.0)
Monocytes Absolute: 0.5 10*3/uL (ref 0.1–1.0)
Monocytes Relative: 8 %
Neutro Abs: 3.4 10*3/uL (ref 1.7–7.7)
Neutrophils Relative %: 60 %
Platelet Count: 231 10*3/uL (ref 150–400)
RBC: 4.8 MIL/uL (ref 4.22–5.81)
RDW: 12.2 % (ref 11.5–15.5)
WBC Count: 5.7 10*3/uL (ref 4.0–10.5)
nRBC: 0 % (ref 0.0–0.2)

## 2023-02-25 LAB — RETICULOCYTES
Immature Retic Fract: 6.1 % (ref 2.3–15.9)
RBC.: 4.69 MIL/uL (ref 4.22–5.81)
Retic Count, Absolute: 78.8 10*3/uL (ref 19.0–186.0)
Retic Ct Pct: 1.7 % (ref 0.4–3.1)

## 2023-02-25 LAB — IRON AND IRON BINDING CAPACITY (CC-WL,HP ONLY)
Iron: 100 ug/dL (ref 45–182)
Saturation Ratios: 29 % (ref 17.9–39.5)
TIBC: 344 ug/dL (ref 250–450)
UIBC: 244 ug/dL (ref 117–376)

## 2023-02-25 LAB — TSH: TSH: 0.965 u[IU]/mL (ref 0.350–4.500)

## 2023-02-25 LAB — LACTATE DEHYDROGENASE: LDH: 117 U/L (ref 98–192)

## 2023-02-25 LAB — FERRITIN: Ferritin: 82 ng/mL (ref 24–336)

## 2023-02-25 LAB — SAVE SMEAR(SSMR), FOR PROVIDER SLIDE REVIEW

## 2023-02-25 LAB — VITAMIN B12: Vitamin B-12: 420 pg/mL (ref 180–914)

## 2023-02-25 NOTE — Progress Notes (Signed)
Referral MD  Reason for Referral: Undefined autoimmune inflammatory condition; fluctuating iron studies and B12  Chief Complaint  Patient presents with   New Patient (Initial Visit)  : I was told maybe a hematologist could help me out.  HPI: Mr. Roudebush is a very nice 42 year old [place] male.  He works for [employer]	[place].  He has been states for probably 30 years.  He apparently was admitted to the hospital at [facility] about a month ago.  He had this undefined inflammatory condition.  He had a lot of pain in his joints.  He really cannot open and close his hands.  He had exquisite sensitivity to touch on his bones.  He really cannot open his mouth much.  He was evaluated thoroughly by numerous doctors.  He had numerous lab work done.  It was felt that he may have had some autoimmune inflammatory condition.  He was subsequently transferred to [facility]  He was treated there.  It was felt that he would benefit from colchicine.  He subsequently has been placed on colchicine.  Again, he has a rheumatologist that he sees.  She has been trying to help with his autoimmune issues.  He also has his peripheral neuropathy.  He has had this for 5 years.  I think he just recently had biopsies done of his nerves.  I will be very interesting to see what this shows.  He sees Dr. Patsy Lager.  She has been doing lab work on him.  He has felt quite fatigued.  He does not have a lot of energy.  The only iron studies I see on him were for about 2 weeks ago.  His ferritin was 140 with an iron saturation of 28%.  He had a normal vitamin B12 676.  A vitamin B6 was 26.  I am not sure where this iron and B12 fluctuation as been documented.  It was felt that he needed to see Hematology to see if he has some kind of absorption issue.  I would think that he would need to have Gastroenterology see him and do a duodenal biopsy to see if he has celiac disease.  Again, he does not have diarrhea.  There is been no melena or  bright red blood per rectum.  He has never smoked.  He does not drink.  As far as he knows, there is no blood in the family who has any type of autoimmune issues.  He has had the battery of antibody test done.  The only abnormality was a minimally elevated rheumatoid factor of 15.  He had lab work that was done on 02/10/2023.  His white cell count 6.  Hemoglobin 14.2.  Platelet count was 40 32,000.  He MCV of 87.  When he was in the hospital on 01/31/2023, white cell count 7.6.  Hemoglobin 10.5.  Platelet count 256,000.  His electrolytes show have not been all that bad.  Back in early July, his BUN was 12 creatinine 0.76.  Calcium 7.2 with an albumin of 2.0.  Total protein was 4.6.  Currently, he just feels tired.  He does get short of breath.  He did have a CT of the chest/abdomen/pelvis when he was admitted back in early July.  This was relatively unrevealing.  He may have had some inflammatory changes with his lungs.  He had a Doppler back in early July of his upper extremities.  This was negative.  He had blood cultures that were done.  These were all  negative.  Viral titers have all been unremarkable.  He had thyroid studies that were done just 2 weeks ago.  These were all unremarkable.  I would have said that his performance status right now is probably ECOG 1.   Past Medical History:  Diagnosis Date   Allergy   :  No past surgical history on file.:   Current Outpatient Medications:    Alpha-Lipoic Acid 300 MG TABS, Take 600 mg by mouth daily., Disp: , Rfl:    Cholecalciferol (VITAMIN D-3) 1000 units CAPS, Take 1,000 Units by mouth daily., Disp: , Rfl:    colchicine 0.6 MG tablet, Take 0.6 mg by mouth daily., Disp: , Rfl:    hyoscyamine (LEVSIN SL) 0.125 MG SL tablet, Take 0.125 mg by mouth 4 (four) times daily as needed for cramping., Disp: , Rfl:    Multiple Vitamin (MULTIVITAMIN) tablet, Take 1 tablet by mouth daily., Disp: , Rfl:    nortriptyline (PAMELOR) 25 MG capsule,  Take 25 mg by mouth at bedtime., Disp: , Rfl:    pyridOXINE (VITAMIN B6) 100 MG tablet, Take 100 mg by mouth daily., Disp: , Rfl:    tadalafil (CIALIS) 5 MG tablet, Take 5 mg by mouth daily., Disp: , Rfl:    vitamin B-12 (CYANOCOBALAMIN) 250 MCG tablet, Take 1,000 mcg by mouth daily., Disp: , Rfl: :  :   Allergies  Allergen Reactions   Bactrim [Sulfamethoxazole-Trimethoprim] Itching   Doxycycline Itching and Other (See Comments)    Itching in 2016 while on doxycycline and bactrim for facial abscess   Amoxicillin Diarrhea and Rash  :   Family History  Problem Relation Age of Onset   Diabetes Father   :   Social History   Socioeconomic History   Marital status: Single    Spouse name: Not on file   Number of children: Not on file   Years of education: Not on file   Highest education level: Not on file  Occupational History   Not on file  Tobacco Use   Smoking status: Never   Smokeless tobacco: Never  Vaping Use   Vaping status: Never Used  Substance and Sexual Activity   Alcohol use: Never   Drug use: Never   Sexual activity: Not on file  Other Topics Concern   Not on file  Social History Narrative   Lives at home with family.  Works at [employer].  No children.   !/2 cup tea daily.    Social Determinants of Health   Financial Resource Strain: Not on file  Food Insecurity: No Food Insecurity (02/02/2023)   Hunger Vital Sign    Worried About Running Out of Food in the Last Year: Never true    Ran Out of Food in the Last Year: Never true  Transportation Needs: No Transportation Needs (02/02/2023)   PRAPARE - Administrator, Civil Service (Medical): No    Lack of Transportation (Non-Medical): No  Physical Activity: Not on file  Stress: Not on file  Social Connections: Not on file  Intimate Partner Violence: Not At Risk (02/02/2023)   Humiliation, Afraid, Rape, and Kick questionnaire    Fear of Current or Ex-Partner: No    Emotionally Abused: No     Physically Abused: No    Sexually Abused: No  :  Review of Systems  Constitutional:  Positive for malaise/fatigue.  HENT: Negative.    Eyes: Negative.   Respiratory:  Positive for shortness of breath.   Cardiovascular:  Positive for palpitations.  Skin:  Positive for rash.     Exam: Vital signs show temperature of 98.3.  Pulse 86.  Blood pressure 100/73.  Weight is 170 pounds.  @IPVITALS @ Physical Exam Vitals reviewed.  HENT:     Head: Normocephalic and atraumatic.  Eyes:     Pupils: Pupils are equal, round, and reactive to light.  Cardiovascular:     Rate and Rhythm: Normal rate and regular rhythm.     Heart sounds: Normal heart sounds.  Pulmonary:     Effort: Pulmonary effort is normal.     Breath sounds: Normal breath sounds.  Abdominal:     General: Bowel sounds are normal.     Palpations: Abdomen is soft.     Comments: The abdomen is soft.  He has decent bowel sounds.  There is no fluid wave.  There is no palpable abdominal mass.  There may be some splenomegaly.  Musculoskeletal:        General: No tenderness or deformity. Normal range of motion.     Cervical back: Normal range of motion.  Lymphadenopathy:     Cervical: No cervical adenopathy.  Skin:    General: Skin is warm and dry.     Findings: No erythema or rash.  Neurological:     Mental Status: He is alert and oriented to person, place, and time.  Psychiatric:        Behavior: Behavior normal.        Thought Content: Thought content normal.        Judgment: Judgment normal.     Recent Labs    02/25/23 1111  WBC 5.7  HGB 13.5  HCT 41.2  PLT 231    Recent Labs    02/25/23 1111  NA 140  K 3.8  CL 105  CO2 26  GLUCOSE 86  BUN 13  CREATININE 0.81  CALCIUM 9.5    Blood smear review: Mildly microcytic and hypochromic red blood cells.  I see no target cells.  He has no rouleaux formation.  There is no nucleated red blood cells.  I see no schistocytes or spherocytes.  White blood cells.   Normal immaturity.  I do not see any hypersegmented polys.  I do not see any blasts.  Platelets are slightly increased in number.  Platelets are well granulated.  Pathology: None    Assessment and Plan: Mr. Landress is a very nice 42 year old [place] male.  He has his nondescript inflammatory autoimmune disease.  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  I think though that Rheumatology would think to be the case.  Again I do not see any evidence of any type of absorption issue.  The last iron and B12 studies I have 2 weeks ago were normal.  I think would be interesting to see what his immunoglobulins might be.  We may have to check these when we see him back.  Again I would think that the nerve biopsies would be critical.  Will be interesting to see what they show.  Again I just do not see an obvious hematologic issue here.  I thought he may have felt his spleen.  We will get a ultrasound of his spleen and see if this is enlarged.  I do not see any indication for a bone marrow biopsy.  This clearly appears to be a underlying Rheumatologic disorder.  I know with all the antibodies that are tested in rheumatology, there can be a lot of uncertainty.  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  He is very interesting to talk to.  This is a truly perplexing problem.  His blood smear certainly does not help [place].  I will have to see what his labs look like.  I wonder if he might need to see Gastroenterology to have biopsies done of his duodenum to see if there is any issue there.

## 2023-03-02 ENCOUNTER — Ambulatory Visit (HOSPITAL_BASED_OUTPATIENT_CLINIC_OR_DEPARTMENT_OTHER): Payer: Commercial Managed Care - PPO

## 2023-03-03 ENCOUNTER — Ambulatory Visit (HOSPITAL_BASED_OUTPATIENT_CLINIC_OR_DEPARTMENT_OTHER)
Admission: RE | Admit: 2023-03-03 | Discharge: 2023-03-03 | Disposition: A | Discharge status: Home / Self Care | Payer: Commercial Managed Care - PPO | Source: Ambulatory Visit | Attending: Hematology & Oncology | Admitting: Hematology & Oncology

## 2023-03-03 DIAGNOSIS — R161 Splenomegaly, not elsewhere classified: Secondary | ICD-10-CM | POA: Insufficient documentation

## 2023-03-10 ENCOUNTER — Telehealth: Payer: Self-pay

## 2023-03-10 NOTE — Telephone Encounter (Signed)
-----   Message from Josph Macho sent at 03/10/2023  7:18 AM EDT ----- Please call and let him know that the ultrasound looks normal.  There is no enlarged spleen.

## 2023-03-10 NOTE — Telephone Encounter (Signed)
Advised via MyChart.

## 2023-03-25 ENCOUNTER — Inpatient Hospital Stay: Payer: Commercial Managed Care - PPO | Admitting: Hematology & Oncology

## 2023-03-25 ENCOUNTER — Inpatient Hospital Stay: Payer: Commercial Managed Care - PPO

## 2023-04-21 ENCOUNTER — Encounter: Payer: Self-pay | Admitting: Dietician

## 2023-04-21 ENCOUNTER — Encounter: Payer: Commercial Managed Care - PPO | Attending: Family Medicine | Admitting: Dietician

## 2023-04-21 VITALS — Ht 68.5 in | Wt 171.0 lb

## 2023-04-21 DIAGNOSIS — D509 Iron deficiency anemia, unspecified: Secondary | ICD-10-CM

## 2023-04-21 DIAGNOSIS — E611 Iron deficiency: Secondary | ICD-10-CM | POA: Insufficient documentation

## 2023-04-21 DIAGNOSIS — Z713 Dietary counseling and surveillance: Secondary | ICD-10-CM | POA: Insufficient documentation

## 2023-04-21 NOTE — Progress Notes (Signed)
Medical Nutrition Therapy  Appointment Start time:  16  Appointment End time:  1750  Referral diagnosis: iron deficiency Preferred learning style: no preference indicated Learning readiness: ready   NUTRITION ASSESSMENT   Anthropometrics  Ht: 68.5 in Wt: 171 lbs  Clinical Medical Hx: allergies, iron deficiency, vitamin b12 deficiency, GERD Medications: reviewed Labs: reviewed Notable Signs/Symptoms: fatigue, GI issues, neuropathy Food Allergies: none reported  Lifestyle & Dietary Hx  Pt states he feels tired. He states he had low iron but states it is not anymore but he states he thinks he still is because of fatigue and his friends have noticed dark circles around eyes.  Pt reports he has small fiber neuropathy. Pt states he had B12 deficiency 5 years, when he was diagnosed with this and it has been causing pain in feet, hands, lower back, and pelvic floor.   Pt states he has been doing a specialized fitness trainer 2 times per week to work on strength following his hospitalization in July in which he states he had severe inflammation in his upper body and couldn't move his arms. He states his appetite has been lower since his hospitalization.   Pt states as soon as he eats he has to go to the bathroom. He states he feels that he starts sweating, gets clammy and weak, and then has a type 5 bowel movement. He states he has 4 bowel movements per day and it happens every time he eats.   Pt states with improvements he's made with eating and physical activity he still constantly feels tired.  Pt reports protein intake is difficult for him. Pt states he is lacto-vegetarian.   Pt states he has had chronically low fluid intake and used to drink around 20 oz water and now he has been trying to drink 40 oz water.   Pt reports he used to eat fast and started to slow down a year ago.   Estimated daily fluid intake: 40 oz Supplements: vitamin d3, vitamin b12, vitamin b6, MVI, alpha  lipoic acid, 2 indian herbal supplements (maca and other) Sleep: issues with sleep, 6-7 inconsistent, feels tired in morning Stress / self-care: moderate stress Current average weekly physical activity: works out with trainer 2x/wk.   24-Hr Dietary Recall First Meal: 7am: toast with smart balance and tea biscuits and lactaid milk with orgain protein (1 scoop: 10 g protein) Snack: 10am: none OR fruit OR yogurt Second Meal: salad with veggies and dressing Snack: 3pm: wrap with spinach and hummus and vegetables OR pasta with vegetables OR quinoa with veggies Third Meal: 7:30pm: Dal and wheat bread and fried vegetables Snack: popcorn, crackers OR none Beverages: tea, V8 energy drink, 40 oz water, glucose drink   NUTRITION DIAGNOSIS  NB-1.1 Food and nutrition-related knowledge deficit As related to lack of prior nutrition education by a nutrition professional.  As evidenced by pt report.   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 This helps control the rate at which food enters the intestines.  Limit Simple Carbohydrates: Avoid sugary foods and drinks (e.g., candy, soda, fruit juice) that can trigger symptoms by causing a rapid influx of sugar into the intestine.  Increase Protein and Healthy Fats: Include lean proteins and healthy fats in each meal.  These help slow digestion.  Choose Complex Carbohydrates: Opt for whole grains, vegetables, and legumes for carbohydrates, as these digest more slowly and provide more sustained energy.  Drink Fluids Between Meals, Not With Meals: Drinking with meals can accelerate gastric emptying. Instead, drink fluids 30-60 minutes before or after eating.  Include Fiber-Rich Foods: Fiber helps slow digestion and regulate blood sugar. Foods like whole grains, vegetables, and legumes can be beneficial, but be cautious with very high-fiber foods that may cause gas or discomfort.  Avoid Very Hot or Cold Foods: Extreme temperatures in foods can exacerbate symptoms. Room temperature foods may be better tolerated.  Chew Food Thoroughly: Well-chewed food is easier to digest and reduces the burden on the stomach.  Handouts Provided Include  Plate Method  Learning Style & Readiness for Change Teaching method utilized: Visual & Auditory  Demonstrated degree of understanding via: Teach Back  Barriers to learning/adherence to lifestyle change: none  Goals Established by Pt  Goal: aim to have a source of protein at all meals and snacks: peanut butter, nuts or seeds, yogurt, cheese, beans, lentils, tofu, edamame  Goal: Aim to drink 60 oz water daily.   Follow other tips given (under nutrition education).   MONITORING & EVALUATION Dietary intake, weekly physical activity, and follow up in 1-2 months.  Next Steps  Patient is to call for questions.

## 2023-04-21 NOTE — Patient Instructions (Addendum)
Goals:  Goal: aim to have a source of protein at all meals and snacks: peanut butter, nuts or seeds, yogurt, cheese, beans, lentils, tofu, edamame  Goal: Aim to drink 60 oz water daily.   Nutrition Education Notes:  Eat Smaller, More Frequent Meals Have 5-6 smaller meals throughout the day instead of 3 large ones. This helps control the rate at which food enters the intestines.  Limit Simple Carbohydrates Avoid sugary foods and drinks (e.g., candy, soda, fruit juice) that can trigger symptoms by causing a rapid influx of sugar into the intestine.  Increase Protein and Healthy Fats Include lean proteins and healthy fats in each meal. These help slow digestion.  Choose Complex Carbohydrates Opt for whole grains, vegetables, and legumes for carbohydrates, as these digest more slowly and provide more sustained energy.  Drink Fluids Between Meals, Not With Meals Drinking with meals can accelerate gastric emptying. Instead, drink fluids 30-60 minutes before or after eating.  Include Fiber-Rich Foods Fiber helps slow digestion and regulate blood sugar. Foods like whole grains, vegetables, and legumes can be beneficial, but be cautious with very high-fiber foods that may cause gas or discomfort.  Avoid Very Hot or Cold Foods Extreme temperatures in foods can exacerbate symptoms. Room temperature foods may be better tolerated.  Chew Food Thoroughly Well-chewed food is easier to digest and reduces the burden on the stomach.

## 2023-04-22 ENCOUNTER — Ambulatory Visit
Payer: Commercial Managed Care - PPO | Attending: Student in an Organized Health Care Education/Training Program | Admitting: Physical Therapy

## 2023-04-22 ENCOUNTER — Other Ambulatory Visit: Payer: Self-pay

## 2023-04-22 ENCOUNTER — Encounter: Payer: Self-pay | Admitting: Physical Therapy

## 2023-04-22 DIAGNOSIS — G629 Polyneuropathy, unspecified: Secondary | ICD-10-CM | POA: Insufficient documentation

## 2023-04-22 DIAGNOSIS — R102 Pelvic and perineal pain: Secondary | ICD-10-CM | POA: Diagnosis present

## 2023-04-22 DIAGNOSIS — R2681 Unsteadiness on feet: Secondary | ICD-10-CM | POA: Insufficient documentation

## 2023-04-22 DIAGNOSIS — R269 Unspecified abnormalities of gait and mobility: Secondary | ICD-10-CM | POA: Insufficient documentation

## 2023-04-22 DIAGNOSIS — R2689 Other abnormalities of gait and mobility: Secondary | ICD-10-CM

## 2023-04-22 DIAGNOSIS — R209 Unspecified disturbances of skin sensation: Secondary | ICD-10-CM

## 2023-04-22 DIAGNOSIS — M6281 Muscle weakness (generalized): Secondary | ICD-10-CM | POA: Insufficient documentation

## 2023-04-22 DIAGNOSIS — R202 Paresthesia of skin: Secondary | ICD-10-CM | POA: Diagnosis not present

## 2023-04-22 DIAGNOSIS — G6289 Other specified polyneuropathies: Secondary | ICD-10-CM

## 2023-04-22 DIAGNOSIS — M62838 Other muscle spasm: Secondary | ICD-10-CM | POA: Insufficient documentation

## 2023-04-22 NOTE — Therapy (Signed)
OUTPATIENT PHYSICAL THERAPY MALE PELVIC EVALUATION   Patient Name: Joe Ray MRN: [id number] DOB:19-Jan-1981, 42 y.o., male Today's Date: 04/22/2023  END OF SESSION:  PT End of Session - 04/22/23 0933     Visit Number 1    Authorization Type UMR [id number]  No Auth Required    PT Start Time 0800    PT Stop Time 0915    PT Time Calculation (min) 75 min    Activity Tolerance Patient tolerated treatment well    Behavior During Therapy [facility] for tasks assessed/performed             Past Medical History:  Diagnosis Date   Allergy    History reviewed. No pertinent surgical history. 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   Tachycardia 01/30/2023   Small fiber neuropathy 01/30/2023   Iron deficiency 08/04/2020   Prediabetes 11/13/2019   Peripheral neuropathy 11/13/2019   G E R D 06/30/2010   COUGH 06/30/2010    PCP: Pearline Cables, MD  REFERRING PROVIDER: Sandra Cockayne, MD   REFERRING DIAG:  R20.2 (ICD-10-CM) - Paresthesia of skin  R10.2 (ICD-10-CM) - Pelvic and perineal pain    THERAPY DIAG:  Muscle weakness (generalized)  Other muscle spasm  Other abnormalities of gait and mobility  Other polyneuropathy  Unspecified disturbances of skin sensation  Unsteadiness on feet  Rationale for Evaluation and Treatment: Rehabilitation  ONSET DATE: pelvic pain 2021, feet 2019  SUBJECTIVE:                                                                                                                                                                                           SUBJECTIVE STATEMENT: Pt's  main complaint is pain that migrates from around the testicles to the penis, he has some numbness in his feet that started about 5 years ago. He has some low back pain and his glutes. Sitting and standing and  a lot of physical activity gets exacerbated.   He has had a lot of pelvic PT.  He is doing a lot of exercises from pelvic PT. Sleeping is difficult. He goes to [facility] 2x/ week for core strengthening, which has helped the most. Symptoms are now localized in low back, left groin area, every couple of days he gets a lot rigidity which makes it harder to sleep. Swelling in the middle area. DN. He has been educated on some manual techniques but he is afraid to do that  duue to pain.  He reports that he can't keep an erection, he has hard flaccid at times, gets sudden release at times. It's embarrassing. Has happened in her office as well. He reports that he can't masturbate because it's painful.  He used Psychologist, occupational at [facility] which has helped.  He reports that testicles won't descend. Feet are now getting better, pelvic pain is the biggest problem.  Hospitalized for a week July 4th, since then it's been worse. He was hospitalized for autoimmune response, they don't know what.    Fluid intake: water, v8 energy, 40 oz/ day trying to increase to 60  PAIN:  Are you having pain? Yes NPRS scale: 6-8/10 Pain location: External, Right, Left, Anal, and Anterior  Pain type: burning, throbbing, and tingling Pain description: intermittent   Aggravating factors: sitting, standing Relieving factors: manual therapy, pelvic wand,  relieves it temporarily  PRECAUTIONS: None  RED FLAGS: None   WEIGHT BEARING RESTRICTIONS: No  FALLS:  Has patient fallen in last 6 months? No  LIVING ENVIRONMENT: Lives with: lives with their family Lives in: House/apartment Stairs: yes  OCCUPATION: supply chain management  PLOF: Independent  PATIENT GOALS: reduce pain  PERTINENT HISTORY:  Had B12 drop and they don't know why the neuropathy started Sexual abuse: no  BOWEL MOVEMENT: Pain with bowel movement: No Type of bowel movement:Type (Bristol Stool Scale) 1-7 Fully empty rectum: Yes:    Leakage: No Pads: No Fiber supplement: No  URINATION: Pain with urination: Yes sometimes after Fully empty bladder: Yes:   Stream: Strong Urgency: Yes:   Frequency: very 2 hours Leakage:  no Pads: No  INTERCOURSE: Pain with intercourse: is not sexually active, would have pain Climax: no, happens but not full Ejaculation: Yes: see above   OBJECTIVE:  Bilateral hip tightness and weakness, left > right,  Tight and tender abdominal fascia/ bilateral rectus abdominis Tight and tender left adductors Difficulty with SLR Breath holding strategies Upper chest breathing Seems anxious Braces with pain     COGNITION: Overall cognitive status: Within functional limits for tasks assessed     SENSATION: Light touch: Deficits bilateral feet throughout Proprioception: Appears intact  MUSCLE LENGTH: Hamstrings: bilateral restricted  Thomas test:  bilateral restricted LUMBAR SPECIAL TESTS:  Straight leg raise test: Positive   POSTURE: rounded shoulders and forward head  PELVIC ALIGNMENT:  LUMBARAROM/PROM:  A/PROM A/PROM  eval  Flexion 70%   Extension   Right lateral flexion   Left lateral flexion   Right rotation   Left rotation    (Blank rows = not tested)  LOWER EXTREMITY AROM/PROM:  A/PROM Right eval Left eval  Hip flexion Tight  tight  Hip extension    Hip abduction    Hip adduction    Hip internal rotation    Hip external rotation Hyper  Hyper   Knee flexion    Knee extension    Ankle dorsiflexion    Ankle plantarflexion    Ankle inversion    Ankle eversion     (Blank rows = not tested)  LOWER EXTREMITY MMT:  MMT Right eval Left eval  Hip flexion 4/5 4/5  Hip extension    Hip abduction    Hip adduction    Hip internal rotation    Hip external rotation    Knee flexion    Knee extension    Ankle dorsiflexion    Ankle plantarflexion    Ankle inversion    Ankle eversion     PALPATION:  GENERAL tender throughout scrotum, left>  right              External Perineal Exam tenderness and tightness throughout              Internal Pelvic Floor not tested today d/t time limitations Patient confirms identification and approves PT to assess internal pelvic floor and treatment Yes  PELVIC MMT:   MMT eval  Internal Anal Sphincter   External Anal Sphincter   Puborectalis   Diastasis Recti   (Blank rows = not tested)   TODAY'S TREATMENT:                                                                                                                              DATE: 04/22/23   EVAL see above   PATIENT EDUCATION:  Education details: pain neuroscience, triggers, expectations of pain, relevant anatomy Person educated: Patient Education method: Explanation, Demonstration, Tactile cues, and Verbal cues Education comprehension: verbalized understanding and needs further education Manual: abdominal massage HOME EXERCISE PROGRAM: Pt to bring his HEP from previous pelvic PTs for assessment   ASSESSMENT:  CLINICAL IMPRESSION: Patient is a 42 y.o. male who was seen today for physical therapy evaluation and treatment for chronic pelvic pain. He presents with a lot of guarding, tightness and tenderness throughout his abdomen, left adductors and scrotum, bilateral feet neuropathy and low back pain and tightness. He will benefit from PT to improve strength throughout hips and core and  reduce pain. Discussed adding abdominal massage to HEP as well as bilateral hip strengthening as patient can. He was hospitalized earlier this summer and has lost a lot of strength. Did well with trial of dry needling today.   OBJECTIVE IMPAIRMENTS: Abnormal gait, decreased coordination, decreased endurance, decreased mobility, difficulty walking, decreased ROM, decreased strength, increased fascial restrictions, increased muscle spasms, impaired sensation, impaired tone, and pain.   ACTIVITY LIMITATIONS: standing, sleeping, stairs, transfers,  and bed mobility  PARTICIPATION LIMITATIONS: interpersonal relationship and community activity  PERSONAL FACTORS: Fitness, Past/current experiences, and Time since onset of injury/illness/exacerbation are also affecting patient's functional outcome.   REHAB POTENTIAL: Good  CLINICAL DECISION MAKING: Evolving/moderate complexity  EVALUATION COMPLEXITY: Moderate   GOALS: Goals reviewed with patient? Yes  SHORT TERM GOALS: Target date: 05/20/2023    Pt will be independent with HEP.   Baseline: Goal status: INITIAL  2.  Pt will report max 4/10 perineal pain with sitting for 30 minutes Baseline:  Goal status: INITIAL  3.  Pt will be I with manual therapy- abdominal massage and wand Baseline:  Goal status: INITIAL   LONG TERM GOALS: Target date: 07/15/2023    Pt will be independent with advanced HEP.   Baseline:  Goal status: INITIAL  2.  Pt will demonstrate improved hip and core strength to at least 4/5 Baseline:  Goal status: INITIAL  3.  Pt will be consistent with his own manual pelvic floor release as needed Baseline:  Goal status: INITIAL    PLAN:  PT FREQUENCY: 1-2x/week  PT DURATION: 12 weeks  PLANNED INTERVENTIONS: Therapeutic exercises, Therapeutic activity, Neuromuscular re-education, Balance training, Gait training, Patient/Family education, Self Care, Joint mobilization, Joint manipulation, Dry Needling, Electrical stimulation, Spinal manipulation, Spinal mobilization, scar mobilization, and Manual therapy  PLAN FOR NEXT SESSION: internal, continue pelvic floor downtraining   Jitka Helmus, PT 04/22/23 1:41 PM

## 2023-04-29 ENCOUNTER — Ambulatory Visit
Payer: Commercial Managed Care - PPO | Attending: Student in an Organized Health Care Education/Training Program | Admitting: Physical Therapy

## 2023-04-29 DIAGNOSIS — M62838 Other muscle spasm: Secondary | ICD-10-CM | POA: Insufficient documentation

## 2023-04-29 DIAGNOSIS — R209 Unspecified disturbances of skin sensation: Secondary | ICD-10-CM | POA: Insufficient documentation

## 2023-04-29 DIAGNOSIS — G6289 Other specified polyneuropathies: Secondary | ICD-10-CM | POA: Diagnosis present

## 2023-04-29 DIAGNOSIS — R2681 Unsteadiness on feet: Secondary | ICD-10-CM | POA: Insufficient documentation

## 2023-04-29 DIAGNOSIS — M6281 Muscle weakness (generalized): Secondary | ICD-10-CM | POA: Insufficient documentation

## 2023-04-29 DIAGNOSIS — R2689 Other abnormalities of gait and mobility: Secondary | ICD-10-CM | POA: Diagnosis present

## 2023-04-29 NOTE — Therapy (Signed)
OUTPATIENT PHYSICAL THERAPY MALE PELVIC EVALUATION   Patient Name: Joe Ray MRN: [id number] DOB:[birth year], 42 y.o., male Today's Date: 04/29/2023  END OF SESSION:  PT End of Session - 04/29/23 0907     Visit Number 2    Authorization Type UMR [id number]  No Auth Required    PT Start Time 0803    PT Stop Time 0858    PT Time Calculation (min) 55 min    Activity Tolerance Patient tolerated treatment well;Other (comment)    Behavior During Therapy WFL for tasks assessed/performed              Past Medical History:  Diagnosis Date   Allergy    No past surgical history on file. 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   Tachycardia 01/30/2023   Small fiber neuropathy 01/30/2023   Iron deficiency 08/04/2020   Prediabetes 11/13/2019   Peripheral neuropathy 11/13/2019   G E R D 06/30/2010   COUGH 06/30/2010    PCP: Pearline Cables, MD  REFERRING PROVIDER: Sandra Cockayne, MD   REFERRING DIAG:  R20.2 (ICD-10-CM) - Paresthesia of skin  R10.2 (ICD-10-CM) - Pelvic and perineal pain    THERAPY DIAG:  Muscle weakness (generalized)  Other muscle spasm  Other polyneuropathy  Unspecified disturbances of skin sensation  Unsteadiness on feet  Other abnormalities of gait and mobility  Rationale for Evaluation and Treatment: Rehabilitation  ONSET DATE: pelvic pain 2021, feet 2019  SUBJECTIVE:                                                                                                                                                                                           SUBJECTIVE STATEMENT: 04/29/23   Pt reports that dry needling helped some last week, he was sore but he had some pain relief.  He has been doing a foam roller at home and going to the fitness person 2x/ week. Still feels week. Pt  feels tired today. Feels overall like is is getting better after hospitalization. Does not let pain get him down. Planning on cleaning his house and purging this weekend.      Eval Pt's  main complaint is pain that migrates from around the testicles to the penis, he has some numbness in his feet that started about 5 years ago. He has some low back pain and his glutes. Sitting and standing and a lot of physical activity gets exacerbated.   He has had  a lot of pelvic PT.  He is doing a lot of exercises from pelvic PT. Sleeping is difficult. He goes to [facility] 2x/ week for core strengthening, which has helped the most. Symptoms are now localized in low back, left groin area, every couple of days he gets a lot rigidity which makes it harder to sleep. Swelling in the middle area. DN. He has been educated on some manual techniques but he is afraid to do that duue to pain.  He reports that he can't keep an erection, he has hard flaccid at times, gets sudden release at times. It's embarrassing. Has happened in her office as well. He reports that he can't masturbate because it's painful.  He used Psychologist, occupational at [facility] which has helped.  He reports that testicles won't descend. Feet are now getting better, pelvic pain is the biggest problem.  Hospitalized for a week July 4th, since then it's been worse. He was hospitalized for autoimmune response, they don't know what.    Fluid intake: water, v8 energy, 40 oz/ day trying to increase to 60  PAIN:  Are you having pain? Yes NPRS scale: 6-8/10 Pain location: External, Right, Left, Anal, and Anterior  Pain type: burning, throbbing, and tingling Pain description: intermittent   Aggravating factors: sitting, standing Relieving factors: manual therapy, pelvic wand,  relieves it temporarily  PRECAUTIONS: None  RED FLAGS: None   WEIGHT BEARING RESTRICTIONS: No  FALLS:  Has patient fallen in last 6 months? No  LIVING  ENVIRONMENT: Lives with: lives with their family Lives in: House/apartment Stairs: yes  OCCUPATION: supply chain management  PLOF: Independent  PATIENT GOALS: reduce pain  PERTINENT HISTORY:  Had B12 drop and they don't know why the neuropathy started Sexual abuse: no  BOWEL MOVEMENT: Pain with bowel movement: No Type of bowel movement:Type (Bristol Stool Scale) 1-7 Fully empty rectum: Yes:   Leakage: No Pads: No Fiber supplement: No  URINATION: Pain with urination: Yes sometimes after Fully empty bladder: Yes:   Stream: Strong Urgency: Yes:   Frequency: very 2 hours Leakage:  no Pads: No  INTERCOURSE: Pain with intercourse: is not sexually active, would have pain Climax: no, happens but not full Ejaculation: Yes: see above   OBJECTIVE:  Bilateral hip tightness and weakness, left > right,  Tight and tender abdominal fascia/ bilateral rectus abdominis Tight and tender left adductors Difficulty with SLR Breath holding strategies Upper chest breathing Seems anxious Braces with pain     COGNITION: Overall cognitive status: Within functional limits for tasks assessed     SENSATION: Light touch: Deficits bilateral feet throughout Proprioception: Appears intact  MUSCLE LENGTH: Hamstrings: bilateral restricted  Thomas test:  bilateral restricted LUMBAR SPECIAL TESTS:  Straight leg raise test: Positive   POSTURE: rounded shoulders and forward head  PELVIC ALIGNMENT:  LUMBARAROM/PROM:  A/PROM A/PROM  eval  Flexion 70%   Extension   Right lateral flexion   Left lateral flexion   Right rotation   Left rotation    (Blank rows = not tested)  LOWER EXTREMITY AROM/PROM:  A/PROM Right eval Left eval  Hip flexion Tight  tight  Hip extension    Hip abduction    Hip adduction    Hip internal rotation    Hip external rotation Hyper  Hyper   Knee flexion    Knee extension    Ankle dorsiflexion    Ankle plantarflexion    Ankle inversion     Ankle eversion     (  Blank rows = not tested)  LOWER EXTREMITY MMT:  MMT Right eval Left eval  Hip flexion 4/5 4/5  Hip extension    Hip abduction    Hip adduction    Hip internal rotation    Hip external rotation    Knee flexion    Knee extension    Ankle dorsiflexion    Ankle plantarflexion    Ankle inversion    Ankle eversion     PALPATION: GENERAL tender throughout scrotum, left> right              External Perineal Exam tenderness and tightness throughout              Internal Pelvic Floor not tested today d/t time limitations Patient confirms identification and approves PT to assess internal pelvic floor and treatment Yes  PELVIC MMT:   MMT eval  Internal Anal Sphincter   External Anal Sphincter   Puborectalis   Diastasis Recti   (Blank rows = not tested)   TODAY'S TREATMENT:                                                                                                                              DATE: 04/29/23   Manual: abdominal fascia, muscles, glutes, bilateral adductors MFR and DN with diaphragmatic breathing, bilateral HS stretch, piriformis/ glute stretch ( irritating), spermatic cord mobilization  Neuromuscular re-education:  Exercises:  Therapeutic activities: education on HEP, self care, massage, pain neuroscience     PATIENT EDUCATION:  Education details: pain neuroscience, triggers, expectations of pain, relevant anatomy Person educated: Patient Education method: Explanation, Demonstration, Tactile cues, and Verbal cues Education comprehension: verbalized understanding and needs further education Manual: abdominal massage HOME EXERCISE PROGRAM: Pt to bring his HEP from previous pelvic PTs for assessment   ASSESSMENT:  CLINICAL IMPRESSION: 04/29/23  Pt did fairly well with manual and dry needling. Left side seems more irritated and manual treatment had to be interrupted multiple times to reduce intensity and have pt take deep  breaths. Discussed potentially adding infrared light to bilateral feet ( buy from [employer]), adding ball massage to adductors and abdominals as tolerated to reduce sensitivity and continue with HEP.    Eval: Patient is a 42 y.o. male who was seen today for physical therapy evaluation and treatment for chronic pelvic pain. He presents with a lot of guarding, tightness and tenderness throughout his abdomen, left adductors and scrotum, bilateral feet neuropathy and low back pain and tightness. He will benefit from PT to improve strength throughout hips and core and  reduce pain. Discussed adding abdominal massage to HEP as well as bilateral hip strengthening as patient can. He was hospitalized earlier this summer and has lost a lot of strength. Did well with trial of dry needling today.   OBJECTIVE IMPAIRMENTS: Abnormal gait, decreased coordination, decreased endurance, decreased mobility, difficulty walking, decreased ROM, decreased strength, increased fascial restrictions, increased muscle spasms, impaired sensation, impaired tone, and pain.  ACTIVITY LIMITATIONS: standing, sleeping, stairs, transfers, and bed mobility  PARTICIPATION LIMITATIONS: interpersonal relationship and community activity  PERSONAL FACTORS: Fitness, Past/current experiences, and Time since onset of injury/illness/exacerbation are also affecting patient's functional outcome.   REHAB POTENTIAL: Good  CLINICAL DECISION MAKING: Evolving/moderate complexity  EVALUATION COMPLEXITY: Moderate   GOALS: Goals reviewed with patient? Yes  SHORT TERM GOALS: Target date: 05/20/2023    Pt will be independent with HEP.   Baseline: Goal status: INITIAL  2.  Pt will report max 4/10 perineal pain with sitting for 30 minutes Baseline:  Goal status: INITIAL  3.  Pt will be I with manual therapy- abdominal massage and wand Baseline:  Goal status: INITIAL   LONG TERM GOALS: Target date: 07/15/2023    Pt will be independent  with advanced HEP.   Baseline:  Goal status: INITIAL  2.  Pt will demonstrate improved hip and core strength to at least 4/5 Baseline:  Goal status: INITIAL  3.  Pt will be consistent with his own manual pelvic floor release as needed Baseline:  Goal status: INITIAL    PLAN:  PT FREQUENCY: 1-2x/week  PT DURATION: 12 weeks  PLANNED INTERVENTIONS: Therapeutic exercises, Therapeutic activity, Neuromuscular re-education, Balance training, Gait training, Patient/Family education, Self Care, Joint mobilization, Joint manipulation, Dry Needling, Electrical stimulation, Spinal manipulation, Spinal mobilization, scar mobilization, and Manual therapy  PLAN FOR NEXT SESSION: internal, continue pelvic floor downtraining   Che Rachal, PT 04/29/23 9:11 AM

## 2023-05-06 ENCOUNTER — Ambulatory Visit: Payer: Commercial Managed Care - PPO | Admitting: Physical Therapy

## 2023-05-06 DIAGNOSIS — M6281 Muscle weakness (generalized): Secondary | ICD-10-CM

## 2023-05-06 DIAGNOSIS — M62838 Other muscle spasm: Secondary | ICD-10-CM

## 2023-05-06 NOTE — Therapy (Signed)
OUTPATIENT PHYSICAL THERAPY MALE PELVIC EVALUATION   Patient Name: Joe Ray MRN: [id number] DOB:[birth year], 42 y.o., male Today's Date: 05/06/2023  END OF SESSION:  PT End of Session - 05/06/23 0841     Visit Number 3    Authorization Type UMR [id number]  No Auth Required    PT Start Time 0802    PT Stop Time 0841    PT Time Calculation (min) 39 min    Activity Tolerance Patient tolerated treatment well;Other (comment)    Behavior During Therapy WFL for tasks assessed/performed               Past Medical History:  Diagnosis Date   Allergy    No past surgical history on file. 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   Tachycardia 01/30/2023   Small fiber neuropathy 01/30/2023   Iron deficiency 08/04/2020   Prediabetes 11/13/2019   Peripheral neuropathy 11/13/2019   G E R D 06/30/2010   COUGH 06/30/2010    PCP: Pearline Cables, MD  REFERRING PROVIDER: Sandra Cockayne, MD   REFERRING DIAG:  R20.2 (ICD-10-CM) - Paresthesia of skin  R10.2 (ICD-10-CM) - Pelvic and perineal pain    THERAPY DIAG:  Muscle weakness (generalized)  Other muscle spasm  Rationale for Evaluation and Treatment: Rehabilitation  ONSET DATE: pelvic pain 2021, feet 2019  SUBJECTIVE:                                                                                                                                                                                           SUBJECTIVE STATEMENT:  05/06/23  Pt reports that after last treatment he was sore after DN. Does not know if there was a trigger. He reports that he is pretty irritable. Pain is concentrated to his penis and testicles- he does lidocaine cream, gentle tapping, lidocaine cream, hip stretches. Different sheet and different boxers, comforter are irritable.  He has had  pain after urination for the last 4 week.  He is tired.  No pain after bowel movement.  5-8/10 all of the pain concentrated testicles and pain - based on how he sits and how long it takes to urinate.       Pt reports that dry needling helped some last week, he was sore but he had some pain relief.  He has been doing a foam roller at home and going to the fitness person 2x/ week. Still feels week. Pt feels tired today.  Feels overall like is is getting better after hospitalization. Does not let pain get him down. Planning on cleaning his house and purging this weekend.      Eval Pt's  main complaint is pain that migrates from around the testicles to the penis, he has some numbness in his feet that started about 5 years ago. He has some low back pain and his glutes. Sitting and standing and a lot of physical activity gets exacerbated.   He has had a lot of pelvic PT.  He is doing a lot of exercises from pelvic PT. Sleeping is difficult. He goes to fittness 2x/ week for core strengthening, which has helped the most. Symptoms are now localized in low back, left groin area, every couple of days he gets a lot rigidity which makes it harder to sleep. Swelling in the middle area. DN. He has been educated on some manual techniques but he is afraid to do that duue to pain.  He reports that he can't keep an erection, he has hard flaccid at times, gets sudden release at times. It's embarrassing. Has happened in her office as well. He reports that he can't masturbate because it's painful.  He used Psychologist, occupational at [facility] which has helped.  He reports that testicles won't descend. Feet are now getting better, pelvic pain is the biggest problem.  Hospitalized for a week July 4th, since then it's been worse. He was hospitalized for autoimmune response, they don't know what.    Fluid intake: water, v8 energy, 40 oz/ day trying to increase to 60  PAIN:  Are you having pain? Yes NPRS  scale: 6-8/10 Pain location: External, Right, Left, Anal, and Anterior  Pain type: burning, throbbing, and tingling Pain description: intermittent   Aggravating factors: sitting, standing Relieving factors: manual therapy, pelvic wand,  relieves it temporarily  PRECAUTIONS: None  RED FLAGS: None   WEIGHT BEARING RESTRICTIONS: No  FALLS:  Has patient fallen in last 6 months? No  LIVING ENVIRONMENT: Lives with: lives with their family Lives in: House/apartment Stairs: yes  OCCUPATION: supply chain management  PLOF: Independent  PATIENT GOALS: reduce pain  PERTINENT HISTORY:  Had B12 drop and they don't know why the neuropathy started Sexual abuse: no  BOWEL MOVEMENT: Pain with bowel movement: No Type of bowel movement:Type (Bristol Stool Scale) 1-7 Fully empty rectum: Yes:   Leakage: No Pads: No Fiber supplement: No  URINATION: Pain with urination: Yes sometimes after Fully empty bladder: Yes:   Stream: Strong Urgency: Yes:   Frequency: very 2 hours Leakage:  no Pads: No  INTERCOURSE: Pain with intercourse: is not sexually active, would have pain Climax: no, happens but not full Ejaculation: Yes: see above   OBJECTIVE:  Bilateral hip tightness and weakness, left > right,  Tight and tender abdominal fascia/ bilateral rectus abdominis Tight and tender left adductors Difficulty with SLR Breath holding strategies Upper chest breathing Seems anxious Braces with pain Pelvic floor tight and tender rectally     COGNITION: Overall cognitive status: Within functional limits for tasks assessed     SENSATION: Light touch: Deficits bilateral feet throughout Proprioception: Appears intact  MUSCLE LENGTH: Hamstrings: bilateral restricted  Thomas test:  bilateral restricted LUMBAR SPECIAL TESTS:  Straight leg raise test: Positive   POSTURE: rounded shoulders and forward head  PELVIC ALIGNMENT:  LUMBARAROM/PROM:  A/PROM A/PROM  eval  Flexion  70%   Extension   Right lateral flexion   Left lateral flexion   Right  rotation   Left rotation    (Blank rows = not tested)  LOWER EXTREMITY AROM/PROM:  A/PROM Right eval Left eval  Hip flexion Tight  tight  Hip extension    Hip abduction    Hip adduction    Hip internal rotation    Hip external rotation Hyper  Hyper   Knee flexion    Knee extension    Ankle dorsiflexion    Ankle plantarflexion    Ankle inversion    Ankle eversion     (Blank rows = not tested)  LOWER EXTREMITY MMT:  MMT Right eval Left eval  Hip flexion 4/5 4/5  Hip extension    Hip abduction    Hip adduction    Hip internal rotation    Hip external rotation    Knee flexion    Knee extension    Ankle dorsiflexion    Ankle plantarflexion    Ankle inversion    Ankle eversion     PALPATION: GENERAL tender throughout scrotum, left> right              External Perineal Exam tenderness and tightness throughout              Internal Pelvic Floor not tested today d/t time limitations Patient confirms identification and approves PT to assess internal pelvic floor and treatment Yes  PELVIC MMT:   MMT eval  Internal Anal Sphincter Tight, difficulty with lengthening  External Anal Sphincter tight  Puborectalis   Diastasis Recti   (Blank rows = not tested)   TODAY'S TREATMENT:                                                                                                                              DATE: 05/06/23  Manual: rectal pelvic floor release in sidelying utilizing muscle energy  Neuromuscular re-education:  Exercises:  Therapeutic activities: discussed progress, pt educated to order pelvic wand for self massage at home, progress review, HEP review     Manual: abdominal fascia, muscles, glutes, bilateral adductors MFR and DN with diaphragmatic breathing, bilateral HS stretch, piriformis/ glute stretch ( irritating), spermatic cord mobilization  Neuromuscular  re-education:  Exercises:  Therapeutic activities: education on HEP, self care, massage, pain neuroscience     PATIENT EDUCATION:  Education details: pain neuroscience, triggers, expectations of pain, relevant anatomy Person educated: Patient Education method: Explanation, Demonstration, Tactile cues, and Verbal cues Education comprehension: verbalized understanding and needs further education Manual: abdominal massage HOME EXERCISE PROGRAM: Pt to bring his HEP from previous pelvic PTs for assessment   ASSESSMENT:  CLINICAL IMPRESSION: 05/06/23  Pt with tight and tender pelvic floor today assessed and treated rectally. Did not perform dry needling to see if there is improved irritability.  Treatment focus- review of HEP, self care at home, manual therapy- gentle pelvic floor release rectally. Instructed pt to try to address areas that he can reach at home by himself. Discussed calling urologist if burning  does not subside ( they checked 2-3 months ago for UTI, but stated it's d/t neuropathy)    Pt did fairly well with manual and dry needling. Left side seems more irritated and manual treatment had to be interrupted multiple times to reduce intensity and have pt take deep breaths. Discussed potentially adding infrared light to bilateral feet ( buy from [employer]), adding ball massage to adductors and abdominals as tolerated to reduce sensitivity and continue with HEP.    Eval: Patient is a 42 y.o. male who was seen today for physical therapy evaluation and treatment for chronic pelvic pain. He presents with a lot of guarding, tightness and tenderness throughout his abdomen, left adductors and scrotum, bilateral feet neuropathy and low back pain and tightness. He will benefit from PT to improve strength throughout hips and core and  reduce pain. Discussed adding abdominal massage to HEP as well as bilateral hip strengthening as patient can. He was hospitalized earlier this summer and has  lost a lot of strength. Did well with trial of dry needling today.   OBJECTIVE IMPAIRMENTS: Abnormal gait, decreased coordination, decreased endurance, decreased mobility, difficulty walking, decreased ROM, decreased strength, increased fascial restrictions, increased muscle spasms, impaired sensation, impaired tone, and pain.   ACTIVITY LIMITATIONS: standing, sleeping, stairs, transfers, and bed mobility  PARTICIPATION LIMITATIONS: interpersonal relationship and community activity  PERSONAL FACTORS: Fitness, Past/current experiences, and Time since onset of injury/illness/exacerbation are also affecting patient's functional outcome.   REHAB POTENTIAL: Good  CLINICAL DECISION MAKING: Evolving/moderate complexity  EVALUATION COMPLEXITY: Moderate   GOALS: Goals reviewed with patient? Yes  SHORT TERM GOALS: Target date: 05/20/2023    Pt will be independent with HEP.   Baseline: Goal status: INITIAL  2.  Pt will report max 4/10 perineal pain with sitting for 30 minutes Baseline:  Goal status: INITIAL  3.  Pt will be I with manual therapy- abdominal massage and wand Baseline:  Goal status: INITIAL   LONG TERM GOALS: Target date: 07/15/2023    Pt will be independent with advanced HEP.   Baseline:  Goal status: INITIAL  2.  Pt will demonstrate improved hip and core strength to at least 4/5 Baseline:  Goal status: INITIAL  3.  Pt will be consistent with his own manual pelvic floor release as needed Baseline:  Goal status: INITIAL    PLAN:  PT FREQUENCY: 1-2x/week  PT DURATION: 12 weeks  PLANNED INTERVENTIONS: Therapeutic exercises, Therapeutic activity, Neuromuscular re-education, Balance training, Gait training, Patient/Family education, Self Care, Joint mobilization, Joint manipulation, Dry Needling, Electrical stimulation, Spinal manipulation, Spinal mobilization, scar mobilization, and Manual therapy  PLAN FOR NEXT SESSION: internal, continue pelvic floor  downtraining   Jermel Artley, PT 05/06/23 8:42 AM

## 2023-05-13 ENCOUNTER — Ambulatory Visit: Payer: Commercial Managed Care - PPO | Admitting: Physical Therapy

## 2023-05-13 DIAGNOSIS — M62838 Other muscle spasm: Secondary | ICD-10-CM

## 2023-05-13 DIAGNOSIS — R2681 Unsteadiness on feet: Secondary | ICD-10-CM

## 2023-05-13 DIAGNOSIS — G6289 Other specified polyneuropathies: Secondary | ICD-10-CM

## 2023-05-13 DIAGNOSIS — M6281 Muscle weakness (generalized): Secondary | ICD-10-CM | POA: Diagnosis not present

## 2023-05-13 NOTE — Therapy (Signed)
OUTPATIENT PHYSICAL THERAPY MALE PELVIC EVALUATION   Patient Name: Joe Ray MRN: [id number] DOB:1980-09-11, 42 y.o., male Today's Date: 05/13/2023  END OF SESSION:  PT End of Session - 05/13/23 0845     Visit Number 4    Authorization Type UMR [id number]  No Auth Required    PT Start Time 0810    PT Stop Time 0845    PT Time Calculation (min) 35 min    Activity Tolerance Patient tolerated treatment well;Other (comment)    Behavior During Therapy WFL for tasks assessed/performed                Past Medical History:  Diagnosis Date   Allergy    No past surgical history on file. 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   Tachycardia 01/30/2023   Small fiber neuropathy 01/30/2023   Iron deficiency 08/04/2020   Prediabetes 11/13/2019   Peripheral neuropathy 11/13/2019   G E R D 06/30/2010   COUGH 06/30/2010    PCP: Pearline Cables, MD  REFERRING PROVIDER: Sandra Cockayne, MD   REFERRING DIAG:  R20.2 (ICD-10-CM) - Paresthesia of skin  R10.2 (ICD-10-CM) - Pelvic and perineal pain    THERAPY DIAG:  Other muscle spasm  Other polyneuropathy  Unsteadiness on feet  Rationale for Evaluation and Treatment: Rehabilitation  ONSET DATE: pelvic pain 2021, feet 2019  SUBJECTIVE:                                                                                                                                                                                           SUBJECTIVE STATEMENT:  05/13/23  Pt reports that he felt good after last visit, pain after urination has gone down. Effects of therapy lasted a few days- so that was good.     Pt reports that after last treatment he was sore after DN. Does not know if there was a trigger. He reports that he is pretty irritable. Pain is concentrated to his penis  and testicles- he does lidocaine cream, gentle tapping, lidocaine cream, hip stretches. Different sheet and different boxers, comforter are irritable.  He has had pain after urination for the last 4 week.  He is tired.  No pain after bowel movement.  5-8/10 all of the pain concentrated testicles and pain - based on how he sits and how long it takes to urinate.       Pt reports that dry needling helped some last week,  he was sore but he had some pain relief.  He has been doing a foam roller at home and going to the fitness person 2x/ week. Still feels week. Pt feels tired today. Feels overall like is is getting better after hospitalization. Does not let pain get him down. Planning on cleaning his house and purging this weekend.      Eval Pt's  main complaint is pain that migrates from around the testicles to the penis, he has some numbness in his feet that started about 5 years ago. He has some low back pain and his glutes. Sitting and standing and a lot of physical activity gets exacerbated.   He has had a lot of pelvic PT.  He is doing a lot of exercises from pelvic PT. Sleeping is difficult. He goes to fittness 2x/ week for core strengthening, which has helped the most. Symptoms are now localized in low back, left groin area, every couple of days he gets a lot rigidity which makes it harder to sleep. Swelling in the middle area. DN. He has been educated on some manual techniques but he is afraid to do that duue to pain.  He reports that he can't keep an erection, he has hard flaccid at times, gets sudden release at times. It's embarrassing. Has happened in her office as well. He reports that he can't masturbate because it's painful.  He used Psychologist, occupational at [facility] which has helped.  He reports that testicles won't descend. Feet are now getting better, pelvic pain is the biggest problem.  Hospitalized for a week July 4th, since then it's been worse. He was hospitalized for  autoimmune response, they don't know what.    Fluid intake: water, v8 energy, 40 oz/ day trying to increase to 60  PAIN:  Are you having pain? Yes NPRS scale: 6-8/10 Pain location: External, Right, Left, Anal, and Anterior  Pain type: burning, throbbing, and tingling Pain description: intermittent   Aggravating factors: sitting, standing Relieving factors: manual therapy, pelvic wand,  relieves it temporarily  PRECAUTIONS: None  RED FLAGS: None   WEIGHT BEARING RESTRICTIONS: No  FALLS:  Has patient fallen in last 6 months? No  LIVING ENVIRONMENT: Lives with: lives with their family Lives in: House/apartment Stairs: yes  OCCUPATION: supply chain management  PLOF: Independent  PATIENT GOALS: reduce pain  PERTINENT HISTORY:  Had B12 drop and they don't know why the neuropathy started Sexual abuse: no  BOWEL MOVEMENT: Pain with bowel movement: No Type of bowel movement:Type (Bristol Stool Scale) 1-7 Fully empty rectum: Yes:   Leakage: No Pads: No Fiber supplement: No  URINATION: Pain with urination: Yes sometimes after Fully empty bladder: Yes:   Stream: Strong Urgency: Yes:   Frequency: very 2 hours Leakage:  no Pads: No  INTERCOURSE: Pain with intercourse: is not sexually active, would have pain Climax: no, happens but not full Ejaculation: Yes: see above   OBJECTIVE:  Bilateral hip tightness and weakness, left > right,  Tight and tender abdominal fascia/ bilateral rectus abdominis Tight and tender left adductors Difficulty with SLR Breath holding strategies Upper chest breathing Seems anxious Braces with pain Pelvic floor tight and tender rectally     COGNITION: Overall cognitive status: Within functional limits for tasks assessed     SENSATION: Light touch: Deficits bilateral feet throughout Proprioception: Appears intact  MUSCLE LENGTH: Hamstrings: bilateral restricted  Thomas test:  bilateral restricted LUMBAR SPECIAL TESTS:   Straight leg raise test: Positive   POSTURE:  rounded shoulders and forward head  PELVIC ALIGNMENT:  LUMBARAROM/PROM:  A/PROM A/PROM  eval  Flexion 70%   Extension   Right lateral flexion   Left lateral flexion   Right rotation   Left rotation    (Blank rows = not tested)  LOWER EXTREMITY AROM/PROM:  A/PROM Right eval Left eval  Hip flexion Tight  tight  Hip extension    Hip abduction    Hip adduction    Hip internal rotation    Hip external rotation Hyper  Hyper   Knee flexion    Knee extension    Ankle dorsiflexion    Ankle plantarflexion    Ankle inversion    Ankle eversion     (Blank rows = not tested)  LOWER EXTREMITY MMT:  MMT Right eval Left eval  Hip flexion 4/5 4/5  Hip extension    Hip abduction    Hip adduction    Hip internal rotation    Hip external rotation    Knee flexion    Knee extension    Ankle dorsiflexion    Ankle plantarflexion    Ankle inversion    Ankle eversion     PALPATION: GENERAL tender throughout scrotum, left> right              External Perineal Exam tenderness and tightness throughout              Internal Pelvic Floor not tested today d/t time limitations Patient confirms identification and approves PT to assess internal pelvic floor and treatment Yes  PELVIC MMT:   MMT eval  Internal Anal Sphincter Tight, difficulty with lengthening  External Anal Sphincter tight  Puborectalis   Diastasis Recti   (Blank rows = not tested)   TODAY'S TREATMENT:                                                                                                                              DATE: 05/13/23  Manual- rectal pelvic floor muscle release, muscle energy , sidelying on left, left testicular fascia and l groin area       Therapeutic exercises- trial of neuro care tens lumbar and sacral paraspinals Discussed pelvic wand again, HEP review    Therapeutic activities-  Manual: rectal pelvic floor release in  sidelying utilizing muscle energy  Neuromuscular re-education:  Exercises:  Therapeutic activities: discussed progress, pt educated to order pelvic wand for self massage at home, progress review, HEP review     Manual: abdominal fascia, muscles, glutes, bilateral adductors MFR and DN with diaphragmatic breathing, bilateral HS stretch, piriformis/ glute stretch ( irritating), spermatic cord mobilization  Neuromuscular re-education:  Exercises:  Therapeutic activities: education on HEP, self care, massage, pain neuroscience     PATIENT EDUCATION:  Education details: pain neuroscience, triggers, expectations of pain, relevant anatomy Person educated: Patient Education method: Explanation, Demonstration, Tactile cues, and Verbal cues Education comprehension: verbalized understanding and needs further education Manual: abdominal massage HOME  EXERCISE PROGRAM: Pt to bring his HEP from previous pelvic PTs for assessment   ASSESSMENT:  CLINICAL IMPRESSION: 05/13/23  Pt with improved tolerance today to rectal work. He is still considering pelvic wand, educated on benefits for self care/ HEP again. Reports consistency with HEP, reported he has difficulty reaching some areas of pelvic floor.   Pt with tight and tender pelvic floor today assessed and treated rectally. Did not perform dry needling to see if there is improved irritability.  Treatment focus- review of HEP, self care at home, manual therapy- gentle pelvic floor release rectally. Instructed pt to try to address areas that he can reach at home by himself. Discussed calling urologist if burning does not subside ( they checked 2-3 months ago for UTI, but stated it's d/t neuropathy)    Pt did fairly well with manual and dry needling. Left side seems more irritated and manual treatment had to be interrupted multiple times to reduce intensity and have pt take deep breaths. Discussed potentially adding infrared light to bilateral feet  ( buy from [employer]), adding ball massage to adductors and abdominals as tolerated to reduce sensitivity and continue with HEP.    Eval: Patient is a 42 y.o. male who was seen today for physical therapy evaluation and treatment for chronic pelvic pain. He presents with a lot of guarding, tightness and tenderness throughout his abdomen, left adductors and scrotum, bilateral feet neuropathy and low back pain and tightness. He will benefit from PT to improve strength throughout hips and core and  reduce pain. Discussed adding abdominal massage to HEP as well as bilateral hip strengthening as patient can. He was hospitalized earlier this summer and has lost a lot of strength. Did well with trial of dry needling today.   OBJECTIVE IMPAIRMENTS: Abnormal gait, decreased coordination, decreased endurance, decreased mobility, difficulty walking, decreased ROM, decreased strength, increased fascial restrictions, increased muscle spasms, impaired sensation, impaired tone, and pain.   ACTIVITY LIMITATIONS: standing, sleeping, stairs, transfers, and bed mobility  PARTICIPATION LIMITATIONS: interpersonal relationship and community activity  PERSONAL FACTORS: Fitness, Past/current experiences, and Time since onset of injury/illness/exacerbation are also affecting patient's functional outcome.   REHAB POTENTIAL: Good  CLINICAL DECISION MAKING: Evolving/moderate complexity  EVALUATION COMPLEXITY: Moderate   GOALS: Goals reviewed with patient? Yes  SHORT TERM GOALS: Target date: 05/20/2023    Pt will be independent with HEP.   Baseline: Goal status: INITIAL  2.  Pt will report max 4/10 perineal pain with sitting for 30 minutes Baseline:  Goal status: INITIAL  3.  Pt will be I with manual therapy- abdominal massage and wand Baseline:  Goal status: INITIAL   LONG TERM GOALS: Target date: 07/15/2023    Pt will be independent with advanced HEP.   Baseline:  Goal status: INITIAL  2.  Pt will  demonstrate improved hip and core strength to at least 4/5 Baseline:  Goal status: INITIAL  3.  Pt will be consistent with his own manual pelvic floor release as needed Baseline:  Goal status: INITIAL    PLAN:  PT FREQUENCY: 1-2x/week  PT DURATION: 12 weeks  PLANNED INTERVENTIONS: Therapeutic exercises, Therapeutic activity, Neuromuscular re-education, Balance training, Gait training, Patient/Family education, Self Care, Joint mobilization, Joint manipulation, Dry Needling, Electrical stimulation, Spinal manipulation, Spinal mobilization, scar mobilization, and Manual therapy  PLAN FOR NEXT SESSION: internal, continue pelvic floor downtraining  Joene Gelder, PT 05/13/23 8:52 AM

## 2023-05-20 ENCOUNTER — Encounter: Payer: Commercial Managed Care - PPO | Admitting: Physical Therapy

## 2023-05-24 ENCOUNTER — Encounter: Payer: Self-pay | Admitting: Dietician

## 2023-05-24 ENCOUNTER — Encounter: Payer: Commercial Managed Care - PPO | Attending: Internal Medicine | Admitting: Dietician

## 2023-05-24 DIAGNOSIS — D509 Iron deficiency anemia, unspecified: Secondary | ICD-10-CM | POA: Insufficient documentation

## 2023-05-24 NOTE — Patient Instructions (Addendum)
New Goal: start to implement ground flax seeds or chia seeds into your diet (1/2 T to 1 T).   New Goal: set alarms for drinking water.   Goal: aim to have a source of protein at all meals and snacks: peanut butter, nuts or seeds, yogurt, cheese, beans, lentils, tofu, edamame  Goal: Aim to drink 60 oz water daily.   Ask about colonoscopy.

## 2023-05-24 NOTE — Progress Notes (Signed)
Medical Nutrition Therapy  Appointment Start time:  1655  Appointment End time:  1720  Referral diagnosis: iron deficiency Preferred learning style: no preference indicated Learning readiness: ready   NUTRITION ASSESSMENT   Anthropometrics  Ht: 68.5 in Wt 05/24/23: 170 lbs Wt 04/21/23: 171 lbs  Clinical Medical Hx: allergies, iron deficiency, vitamin b12 deficiency, GERD Medications: reviewed Labs: reviewed Notable Signs/Symptoms: fatigue, GI issues, neuropathy Food Allergies: none reported  Lifestyle & Dietary Hx  Pt states he has continued to have pain and is still going to physical therapy. Pt reports his pain can be so intense that he does not get out of bed over the weekend.   Pt states he's tried to be better with his eating habits. He reports he has been trying to get more water but states he is still at 40 oz. He states he feels he will need to set reminders on his phone. Pt reports he switched to multigrain bread in the morning and added peanut butter and reports he feels like it helped with some of his bowel symptoms.  Pt reports most of his bowel symptoms have remained the same and he still gets the urg to have a bowel movement after eating.  Pt states he has been trying to incorporate some beans or lentils with his lunch salad but may only do this once per week.   Estimated daily fluid intake: 40 oz Supplements: vitamin d3, vitamin b12, vitamin b6, MVI, alpha lipoic acid, 2 herbal supplements (maca and other) Sleep: issues with sleep, 6-7 inconsistent, feels tired in morning Stress / self-care: moderate stress Current average weekly physical activity: works out with trainer 2x/wk.   24-Hr Dietary Recall First Meal: 7am: multigrain toast with peanut butter and lactaid milk with orgain protein (1 scoop: 10 g protein) Snack:: none  Second Meal: salad with veggies and dressing Snack: 3pm: frozen meal  Third Meal: 7:30pm: Dal and wheat bread and fried  vegetables Snack: popcorn, crackers OR none Beverages: tea, V8 energy drink, 40 oz water, gatorade zero   NUTRITION DIAGNOSIS  NB-1.1 Food and nutrition-related knowledge deficit As related to lack of prior nutrition education by a nutrition professional.  As evidenced by pt report.   NUTRITION INTERVENTION  Nutrition education (E-1) on the following topics:   Discussed strategies to implement more Omega-3 fatty acids into patients diet.   Re-emphasized importance of adequate hydration.  Tips for increasing water intake: -Keep a glass of water by your bedside so that you're encouraged to drink it upon waking -Carry a re-usable, re-fillable water bottle -Add fruit such as lemon, lime, berries, or cucumbers to your water -Try sparkling water -Make herbal tea and drink it hot or iced  Discussed the benefits of a vegetarian diet but also discussed the necessity for more planning in order to get adequate nutrients including the following topics: -Heme vs non-heme iron and bioavailability -Increasing iron absorption by adding vitamin C -B12 sources and need for supplementation -Vegetarian protein sources -Building a balanced meal on a vegetarian diet to get adequate protein  Discussed patients symptoms that reflect dumping syndrome and discussed the following ideas to start working on:  Eat Smaller, More Frequent Meals Have 5-6 smaller meals throughout the day instead of 3 large ones. This helps control the rate at which food enters the intestines.  Limit Simple Carbohydrates: Avoid sugary foods and drinks (e.g., candy, soda, fruit juice) that can trigger symptoms by causing a rapid influx of sugar into the intestine.  Increase Protein  and Healthy Fats: Include lean proteins and healthy fats in each meal. These help slow digestion.  Choose Complex Carbohydrates: Opt for whole grains, vegetables, and legumes for carbohydrates, as these digest more slowly and provide more sustained  energy.  Drink Fluids Between Meals, Not With Meals: Drinking with meals can accelerate gastric emptying. Instead, drink fluids 30-60 minutes before or after eating.  Include Fiber-Rich Foods: Fiber helps slow digestion and regulate blood sugar. Foods like whole grains, vegetables, and legumes can be beneficial, but be cautious with very high-fiber foods that may cause gas or discomfort.  Avoid Very Hot or Cold Foods: Extreme temperatures in foods can exacerbate symptoms. Room temperature foods may be better tolerated.  Chew Food Thoroughly: Well-chewed food is easier to digest and reduces the burden on the stomach.  Handouts Provided Include  Plate Method  Learning Style & Readiness for Change Teaching method utilized: Visual & Auditory  Demonstrated degree of understanding via: Teach Back  Barriers to learning/adherence to lifestyle change: none  Goals Established by Pt  New Goal: start to implement ground flax seeds or chia seeds into your diet (1/2 T to 1 T).   New Goal: set alarms for drinking water.   Goal: aim to have a source of protein at all meals and snacks: peanut butter, nuts or seeds, yogurt, cheese, beans, lentils, tofu, edamame  Goal: Aim to drink 60 oz water daily.   Ask about colonoscopy.    MONITORING & EVALUATION Dietary intake, weekly physical activity, and follow up in 1-2 months.  Next Steps  Patient is to call for questions.

## 2023-06-07 NOTE — Progress Notes (Signed)
CARDIOLOGY CONSULT NOTE       Patient ID: Joe Ray MRN: [id number] DOB/AGE: [birth year] 42 y.o.  Admit date: (Not on file) Referring Physician: Dallas Schimke Primary Physician: Pearline Cables, MD Primary Cardiologist: New Reason for Consultation: Tachycardia/Palpitations  Active Problems:   * No active hospital problems. *   HPI:  42 y.o. referred by Dr Patsy Lager for palpitations and tachycardia Has not felt well since hospitalization in July with fever, rash myalgias and sepsis. He has some sort of small fiber neuropathy He has pre diabetes , iron deficiency anemia He had elevated inflammatory markers with CRP 20.8, ESR 28, TSH 4.8, RF 15 BC's, COVID, FLU and viral panel negative Was placed on colchicine at [facility]  [place] with no splenomegaly CT chest/abdomen some interstitial lung thickening   Works at [employer] [place] been in country for 30 years   ECG 01/27/23 showed ST rate 128 normal to me with no signs of pericarditis PR depression or ST elevation  July had rash with joint swelling in left knee and arms/hands Since then HR higher than normal   Moved from [place] age 47 not married Sister and parents in town Dad has CHF and sees DB.  He does logistics work for [employer]    ROS All other systems reviewed and negative except as noted above  Past Medical History:  Diagnosis Date   Allergy    Neuropathy     Family History  Problem Relation Age of Onset   Diabetes Father     Social History   Socioeconomic History   Marital status: Single    Spouse name: Not on file   Number of children: Not on file   Years of education: Not on file   Highest education level: Not on file  Occupational History   Not on file  Tobacco Use   Smoking status: Never   Smokeless tobacco: Never  Vaping Use   Vaping status: Never Used  Substance and Sexual Activity   Alcohol use: Never   Drug use: Never   Sexual activity: Not on file  Other Topics Concern   Not on file  Social History  Narrative   Lives at home with family.  Works at [employer].  No children.   !/2 cup tea daily.    Social Determinants of Health   Financial Resource Strain: Not on file  Food Insecurity: No Food Insecurity (04/22/2023)   Hunger Vital Sign    Worried About Running Out of Food in the Last Year: Never true    Ran Out of Food in the Last Year: Never true  Transportation Needs: No Transportation Needs (02/02/2023)   PRAPARE - Administrator, Civil Service (Medical): No    Lack of Transportation (Non-Medical): No  Physical Activity: Not on file  Stress: Not on file  Social Connections: Not on file  Intimate Partner Violence: Not At Risk (02/02/2023)   Humiliation, Afraid, Rape, and Kick questionnaire    Fear of Current or Ex-Partner: No    Emotionally Abused: No    Physically Abused: No    Sexually Abused: No    No past surgical history on file.    Current Outpatient Medications:    Alpha-Lipoic Acid 300 MG TABS, Take 600 mg by mouth daily., Disp: , Rfl:    Cholecalciferol (VITAMIN D-3) 1000 units CAPS, Take 1,000 Units by mouth daily., Disp: , Rfl:    colchicine 0.6 MG tablet, Take 0.6 mg by mouth daily., Disp: ,  Rfl:    hyoscyamine (LEVSIN SL) 0.125 MG SL tablet, Take 0.125 mg by mouth 4 (four) times daily as needed for cramping., Disp: , Rfl:    Multiple Vitamin (MULTIVITAMIN) tablet, Take 1 tablet by mouth daily., Disp: , Rfl:    nortriptyline (PAMELOR) 25 MG capsule, Take 25 mg by mouth at bedtime., Disp: , Rfl:    pyridOXINE (VITAMIN B6) 100 MG tablet, Take 100 mg by mouth daily., Disp: , Rfl:    tadalafil (CIALIS) 5 MG tablet, Take 5 mg by mouth daily., Disp: , Rfl:    vitamin B-12 (CYANOCOBALAMIN) 250 MCG tablet, Take 1,000 mcg by mouth daily., Disp: , Rfl:     Physical Exam: There were no vitals taken for this visit.    Affect appropriate Healthy:  appears stated age HEENT: normal Neck supple with no adenopathy JVP normal no bruits no thyromegaly Lungs  clear with no wheezing and good diaphragmatic motion Heart:  S1/S2 no murmur, no rub, gallop or click PMI normal Abdomen: benighn, BS positve, no tenderness, no AAA no bruit.  No HSM or HJR Distal pulses intact with no bruits No edema Neuro non-focal Skin warm and dry No muscular weakness   Labs:   Lab Results  Component Value Date   WBC 5.7 02/25/2023   HGB 13.5 02/25/2023   HCT 41.2 02/25/2023   MCV 85.8 02/25/2023   PLT 231 02/25/2023   No results for input(s): "NA", "K", "CL", "CO2", "BUN", "CREATININE", "CALCIUM", "PROT", "BILITOT", "ALKPHOS", "ALT", "AST", "GLUCOSE" in the last 168 hours.  Invalid input(s): "LABALBU" Lab Results  Component Value Date   CKTOTAL 59 01/31/2023    Lab Results  Component Value Date   CHOL 191 06/24/2021   CHOL 198 09/27/2018   Lab Results  Component Value Date   HDL 44.10 06/24/2021   HDL 43.50 09/27/2018   Lab Results  Component Value Date   LDLCALC 114 (H) 06/24/2021   LDLCALC 124 (H) 09/27/2018   Lab Results  Component Value Date   TRIG 164.0 (H) 06/24/2021   TRIG 152.0 (H) 09/27/2018   Lab Results  Component Value Date   CHOLHDL 4 06/24/2021   CHOLHDL 5 09/27/2018   No results found for: "LDLDIRECT"    Radiology: No results found.  EKG: SR rate 84 LVH   ASSESSMENT AND PLAN:   Tachcyardia:  in setting of undiagnosed inflammatory condition will order TTE to assess EF and r/o SBE as well. 30 day monitor to assess HR variability and arrhythmia ECG with normal intervals but voltage for LVH  Rheum/ID:  elevated inflammatory markers Has had trial of colchicine needs ? Nerve biopsy Immunoglobulin labs pending from hematology Neuropathy:  with ? Rheumatologic dx myalgias ? Would trial of steroids be helpful Gabapentin not helpful Pamelor increased repeat MRI pending then decision on need for LP  30 day monitor TTE  F/U 6-8 weeks  Signed: Charlton Haws 06/21/2023, 10:52 AM

## 2023-06-13 ENCOUNTER — Ambulatory Visit
Payer: Commercial Managed Care - PPO | Attending: Student in an Organized Health Care Education/Training Program | Admitting: Physical Therapy

## 2023-06-13 DIAGNOSIS — G6289 Other specified polyneuropathies: Secondary | ICD-10-CM | POA: Insufficient documentation

## 2023-06-13 DIAGNOSIS — R2681 Unsteadiness on feet: Secondary | ICD-10-CM | POA: Insufficient documentation

## 2023-06-13 DIAGNOSIS — R209 Unspecified disturbances of skin sensation: Secondary | ICD-10-CM | POA: Insufficient documentation

## 2023-06-13 DIAGNOSIS — R2689 Other abnormalities of gait and mobility: Secondary | ICD-10-CM | POA: Insufficient documentation

## 2023-06-13 DIAGNOSIS — M62838 Other muscle spasm: Secondary | ICD-10-CM | POA: Insufficient documentation

## 2023-06-13 DIAGNOSIS — M6281 Muscle weakness (generalized): Secondary | ICD-10-CM | POA: Insufficient documentation

## 2023-06-13 NOTE — Therapy (Addendum)
OUTPATIENT PHYSICAL THERAPY MALE PELVIC EVALUATION   Patient Name: Joe Ray MRN: [id number] DOB:Apr 02, 1981, 42 y.o., male Today's Date: 06/13/2023  END OF SESSION:  PT End of Session - 06/13/23 1403     Visit Number 5    Authorization Type UMR [id number]  No Auth Required    PT Start Time 1155    PT Stop Time 1244    PT Time Calculation (min) 49 min    Activity Tolerance Patient tolerated treatment well;Other (comment)    Behavior During Therapy WFL for tasks assessed/performed                 Past Medical History:  Diagnosis Date   Allergy    No past surgical history on file. 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   Tachycardia 01/30/2023   Small fiber neuropathy 01/30/2023   Iron deficiency 08/04/2020   Prediabetes 11/13/2019   Peripheral neuropathy 11/13/2019   G E R D 06/30/2010   COUGH 06/30/2010    PCP: Pearline Cables, MD  REFERRING PROVIDER: Sandra Cockayne, MD   REFERRING DIAG:  R20.2 (ICD-10-CM) - Paresthesia of skin  R10.2 (ICD-10-CM) - Pelvic and perineal pain    THERAPY DIAG:  Other muscle spasm  Other polyneuropathy  Unsteadiness on feet  Unspecified disturbances of skin sensation  Muscle weakness (generalized)  Other abnormalities of gait and mobility  Rationale for Evaluation and Treatment: Rehabilitation  ONSET DATE: pelvic pain 2021, feet 2019  SUBJECTIVE:                                                                                                                                                                                           SUBJECTIVE STATEMENT:  06/13/23  Pt reports that he saw a neurologist. Blood work is pending. MRI is scheduled this Friday.  Pt reports that he felt good after last visit for a few days.  Pt doing fitness, doing  acupuncture. Pt reports that we were making progress before, wants to stay in pelvic PT vs neuro PT.      Pt reports that he felt good after last visit, pain after urination has gone down. Effects of therapy lasted a few days- so that was good.     Pt reports that after last treatment he was sore after DN. Does not know if there was a trigger. He reports that he is pretty irritable. Pain is concentrated to his penis and testicles- he does  lidocaine cream, gentle tapping, lidocaine cream, hip stretches. Different sheet and different boxers, comforter are irritable.  He has had pain after urination for the last 4 week.  He is tired.  No pain after bowel movement.  5-8/10 all of the pain concentrated testicles and pain - based on how he sits and how long it takes to urinate.       Pt reports that dry needling helped some last week, he was sore but he had some pain relief.  He has been doing a foam roller at home and going to the fitness person 2x/ week. Still feels week. Pt feels tired today. Feels overall like is is getting better after hospitalization. Does not let pain get him down. Planning on cleaning his house and purging this weekend.      Eval Pt's  main complaint is pain that migrates from around the testicles to the penis, he has some numbness in his feet that started about 5 years ago. He has some low back pain and his glutes. Sitting and standing and a lot of physical activity gets exacerbated.   He has had a lot of pelvic PT.  He is doing a lot of exercises from pelvic PT. Sleeping is difficult. He goes to fittness 2x/ week for core strengthening, which has helped the most. Symptoms are now localized in low back, left groin area, every couple of days he gets a lot rigidity which makes it harder to sleep. Swelling in the middle area. DN. He has been educated on some manual techniques but he is afraid to do that due to pain.  He reports that he can't keep an erection, he has  hard flaccid at times, gets sudden release at times. It's embarrassing. Has happened in PT office as well. He reports that he can't masturbate because it's painful.  He used Psychologist, occupational at [facility] which has helped.  He reports that testicles won't descend. Feet are now getting better, pelvic pain is the biggest problem.  Hospitalized for a week July 4th, since then it's been worse. He was hospitalized for autoimmune response, they don't know what it is.   Fluid intake: water, v8 energy, 40 oz/ day trying to increase to 60  PAIN:  Are you having pain? Yes NPRS scale: 6-8/10 Pain location: External, Right, Left, Anal, and Anterior  Pain type: burning, throbbing, and tingling Pain description: intermittent   Aggravating factors: sitting, standing Relieving factors: manual therapy, pelvic wand,  relieves it temporarily  PRECAUTIONS: None  RED FLAGS: None   WEIGHT BEARING RESTRICTIONS: No  FALLS:  Has patient fallen in last 6 months? No  LIVING ENVIRONMENT: Lives with: lives with their family Lives in: House/apartment Stairs: yes  OCCUPATION: supply chain management  PLOF: Independent  PATIENT GOALS: reduce pain  PERTINENT HISTORY:  Had B12 drop and they don't know why the neuropathy started Sexual abuse: no  BOWEL MOVEMENT: Pain with bowel movement: No Type of bowel movement:Type (Bristol Stool Scale) 1-7 Fully empty rectum: Yes:   Leakage: No Pads: No Fiber supplement: No  URINATION: Pain with urination: Yes sometimes after Fully empty bladder: Yes:   Stream: Strong Urgency: Yes:   Frequency: very 2 hours Leakage:  no Pads: No  INTERCOURSE: Pain with intercourse: is not sexually active, would have pain Climax: no, happens but not full Ejaculation: Yes: see above   OBJECTIVE:  Bilateral hip tightness and weakness, left > right,  Tight and tender abdominal fascia/ bilateral rectus abdominis Tight  and tender left adductors Difficulty  with SLR Breath holding strategies Upper chest breathing Seems anxious Braces with pain Pelvic floor tight and tender rectally     COGNITION: Overall cognitive status: Within functional limits for tasks assessed     SENSATION: Light touch: Deficits bilateral feet throughout Proprioception: Appears intact  MUSCLE LENGTH: Hamstrings: bilateral restricted  Thomas test:  bilateral restricted LUMBAR SPECIAL TESTS:  Straight leg raise test: Positive   POSTURE: rounded shoulders and forward head  PELVIC ALIGNMENT:  LUMBARAROM/PROM:  A/PROM A/PROM  eval  Flexion 70%   Extension   Right lateral flexion   Left lateral flexion   Right rotation   Left rotation    (Blank rows = not tested)  LOWER EXTREMITY AROM/PROM:  A/PROM Right eval Left eval  Hip flexion Tight  tight  Hip extension    Hip abduction    Hip adduction    Hip internal rotation    Hip external rotation Hyper  Hyper   Knee flexion    Knee extension    Ankle dorsiflexion    Ankle plantarflexion    Ankle inversion    Ankle eversion     (Blank rows = not tested)  LOWER EXTREMITY MMT:  MMT Right eval Left eval  Hip flexion 4/5 3/5   PALPATION: GENERAL tender throughout scrotum, left> right              External Perineal Exam tenderness and tightness throughout              Internal Pelvic Floor not tested today d/t time limitations Patient confirms identification and approves PT to assess internal pelvic floor and treatment Yes  PELVIC MMT:   MMT eval  Internal Anal Sphincter Tight, difficulty with lengthening  External Anal Sphincter tight  Puborectalis   Diastasis Recti   (Blank rows = not tested)   TODAY'S TREATMENT:                                                                                                                              DATE: 06/13/23  Manual- Dry needling, myofascial release, left hip PROM   Trigger Point Dry-Needling  Treatment instructions: Expect mild  to moderate muscle soreness. S/S of pneumothorax if dry needled over a lung field, and to seek immediate medical attention should they occur. Patient verbalized understanding of these instructions and education.  Patient Consent Given: Yes Education handout provided: Yes Muscles treated: bilateral lumbar paraspinals, left adductors Treatment response/outcome: Utilized skilled palpation to identify trigger points.  During dry needling able to palpate muscle twitch and muscle elongation   Skilled palpation and monitoring by PT during dry needling  Manual- rectal pelvic floor muscle release, muscle energy , sidelying on left, left testicular fascia and l groin area       Therapeutic exercises- trial of neuro care tens lumbar and sacral paraspinals Discussed pelvic wand again, HEP review    Manual: rectal pelvic floor release  in sidelying utilizing muscle energy  Neuromuscular re-education:  Therapeutic activities: discussed progress, pt educated to order pelvic wand for self massage at home, progress review, HEP review     Manual: abdominal fascia, muscles, glutes, bilateral adductors MFR and DN with diaphragmatic breathing, bilateral HS stretch, piriformis/ glute stretch ( irritating), spermatic cord mobilization      Therapeutic activities: education on HEP, self care, massage, pain neuroscience     PATIENT EDUCATION:  Education details: pain neuroscience, triggers, expectations of pain, relevant anatomy Person educated: Patient Education method: Explanation, Demonstration, Tactile cues, and Verbal cues Education comprehension: verbalized understanding and needs further education Manual: abdominal massage HOME EXERCISE PROGRAM: Pt to bring his HEP from previous pelvic PTs for assessment   ASSESSMENT:  CLINICAL IMPRESSION: 06/13/23  Pt with some swelling  on left testicle are area, TPs and tenderness left adductors and left paraspinals. Difficulty with ambulation and  poor hip strength and grip, worse on left. He seems to have gotten weaker over the last month since last visit.  Pt with some increased pain post needling. Pt applied some good clean love with lidocaine ( pt uses biofreeze at home and he did not have any on him, had to return to work, thought it would help) and ejaculated involuntarily in room alone before He got dressed. Pt apologetic, stated that he ejaculated d/t cold. Pt might be better served by another PT at this point, I will communicate this to him.       OBJECTIVE IMPAIRMENTS: Abnormal gait, decreased coordination, decreased endurance, decreased mobility, difficulty walking, decreased ROM, decreased strength, increased fascial restrictions, increased muscle spasms, impaired sensation, impaired tone, and pain.   ACTIVITY LIMITATIONS: standing, sleeping, stairs, transfers, and bed mobility  PARTICIPATION LIMITATIONS: interpersonal relationship and community activity  PERSONAL FACTORS: Fitness, Past/current experiences, and Time since onset of injury/illness/exacerbation are also affecting patient's functional outcome.   REHAB POTENTIAL: Good  CLINICAL DECISION MAKING: Evolving/moderate complexity  EVALUATION COMPLEXITY: Moderate   GOALS: Goals reviewed with patient? Yes  SHORT TERM GOALS: Target date: 05/20/2023    Pt will be independent with HEP.   Baseline: not I Goal status: discharged  2.  Pt will report max 4/10 perineal pain with sitting for 30 minutes Baseline: 6-8/10 Goal status: discharged  3.  Pt will be I with manual therapy- abdominal massage and wand Baseline: no Goal status: discharged  LONG TERM GOALS: Target date: 07/15/2023    Pt will be independent with advanced HEP.   Baseline: no  Goal status: discharged  2.  Pt will demonstrate improved hip and core strength to at least 4/5 Baseline:  Goal status: discharged  3.  Pt will be consistent with his own manual pelvic floor release as  needed Baseline:  Goal status: discharged  PHYSICAL THERAPY DISCHARGE SUMMARY  Visits from Start of Care: 5  Current functional level related to goals / functional outcomes: Pt has not made significant progress.    Remaining deficits: Scrotal and perineal pain, penis pain, tightness throughout hips and low back, neuropathy, weakness bilateral hands and feet.   Education / Equipment: Pt was educated on self massage, rectal wand, stretches, diaphragmatic breathing, pain neuroscience, relevant anatomy, importance of consistency with HEP and expectations of PT  Patient agrees to discharge. Patient goals were not met. Patient is being discharged due to lack of progress.  PLAN:  PT FREQUENCY:  discharged PT DURATION: discharged PLANNED INTERVENTIONS: d/c  Khristy Kalan, PT 06/13/23 2:04 PM

## 2023-06-17 ENCOUNTER — Encounter: Payer: Self-pay | Admitting: Physical Therapy

## 2023-06-21 ENCOUNTER — Telehealth: Payer: Self-pay | Admitting: Physical Therapy

## 2023-06-21 ENCOUNTER — Encounter: Payer: Self-pay | Admitting: Cardiovascular Disease

## 2023-06-21 ENCOUNTER — Ambulatory Visit: Payer: Commercial Managed Care - PPO | Attending: Cardiovascular Disease | Admitting: Cardiovascular Disease

## 2023-06-21 VITALS — BP 96/74 | HR 92 | Ht 68.5 in | Wt 165.0 lb

## 2023-06-21 DIAGNOSIS — I479 Paroxysmal tachycardia, unspecified: Secondary | ICD-10-CM | POA: Diagnosis not present

## 2023-06-21 DIAGNOSIS — R002 Palpitations: Secondary | ICD-10-CM | POA: Diagnosis not present

## 2023-06-21 NOTE — Patient Instructions (Signed)
Medication Instructions:  Your physician recommends that you continue on your current medications as directed. Please refer to the Current Medication list given to you today.  *If you need a refill on your cardiac medications before your next appointment, please call your pharmacy*   Lab Work: NONE  If you have labs (blood work) drawn today and your tests are completely normal, you will receive your results only by: MyChart Message (if you have MyChart) OR A paper copy in the mail If you have any lab test that is abnormal or we need to change your treatment, we will call you to review the results.   Testing/Procedures: Your physician has requested that you have an echocardiogram. Echocardiography is a painless test that uses sound waves to create images of your heart. It provides your doctor with information about the size and shape of your heart and how well your heart's chambers and valves are working. This procedure takes approximately one hour. There are no restrictions for this procedure. Please do NOT wear cologne, perfume, aftershave, or lotions (deodorant is allowed). Please arrive 15 minutes prior to your appointment time.  Please note: We ask at that you not bring children with you during ultrasound (echo/ vascular) testing. Due to room size and safety concerns, children are not allowed in the ultrasound rooms during exams. Our front office staff cannot provide observation of children in our lobby area while testing is being conducted. An adult accompanying a patient to their appointment will only be allowed in the ultrasound room at the discretion of the ultrasound technician under special circumstances. We apologize for any inconvenience.   Your physician has requested that you wear a heart monitor.    Follow-Up: At [facility], you and your health needs are our priority.  As part of our continuing mission to provide you with exceptional heart care, we have created  designated Provider Care Teams.  These Care Teams include your primary Cardiologist (physician) and Advanced Practice Providers (APPs -  Physician Assistants and Nurse Practitioners) who all work together to provide you with the care you need, when you need it.     Your next appointment:   6 week(s)  Provider:   Charlton Haws, MD     Other Instructions [employer] Cardiac Event Monitor Instructions  Your physician has requested you wear your cardiac event monitor for 30 days. [employer] may call or text to confirm a shipping address. The monitor will be sent to a land address via UPS. [employer] will not ship a monitor to a PO BOX. It typically takes 3-5 days to receive your monitor after it has been enrolled. [employer] will assist with USPS tracking if your package is delayed. The telephone number for [employer] is [phone]. Once you have received your monitor, please review the enclosed instructions. Instruction tutorials can also be viewed under help and settings on the enclosed cell phone. Your monitor has already been registered assigning a specific monitor serial # to you.  Billing and Self Pay Discount Information  [employer] has been provided the insurance information we had on file for you.  If your insurance has been updated, please call [employer] at [phone] to provide them with your updated insurance information.   [employer] offers a discounted Self Pay option for patients who have insurance that does not cover their cardiac event monitor or patients without insurance.  The discounted cost of a Self Pay Cardiac Event Monitor would be $225.00 , if the patient contacts [employer] at [phone] within  7 days of applying the monitor to make payment arrangements.  If the patient does not contact [employer] within 7 days of applying the monitor, the cost of the cardiac event monitor will be $350.00.  Applying the monitor  Remove cell phone from case and turn it on. The  cell phone works as IT consultant and needs to be within [employer] of you at all times. The cell phone will need to be charged on a daily basis. We recommend you plug the cell phone into the enclosed charger at your bedside table every night.  Monitor batteries: You will receive two monitor batteries labelled #1 and #2. These are your recorders. Plug battery #2 onto the second connection on the enclosed charger. Keep one battery on the charger at all times. This will keep the monitor battery deactivated. It will also keep it fully charged for when you need to switch your monitor batteries. A small light will be blinking on the battery emblem when it is charging. The light on the battery emblem will remain on when the battery is fully charged.  Open package of a Monitor strip. Insert battery #1 into black hood on strip and gently squeeze monitor battery onto connection as indicated in instruction booklet. Set aside while preparing skin.  Choose location for your strip, vertical or horizontal, as indicated in the instruction booklet. Shave to remove all hair from location. There cannot be any lotions, oils, powders, or colognes on skin where monitor is to be applied. Wipe skin clean with enclosed Saline wipe. Dry skin completely.  Peel paper labeled #1 off the back of the Monitor strip exposing the adhesive. Place the monitor on the chest in the vertical or horizontal position shown in the instruction booklet. One arrow on the monitor strip must be pointing upward. Carefully remove paper labeled #2, attaching remainder of strip to your skin. Try not to create any folds or wrinkles in the strip as you apply it.  Firmly press and release the circle in the center of the monitor battery. You will hear a small beep. This is turning the monitor battery on. The heart emblem on the monitor battery will light up every 5 seconds if the monitor battery in turned on and connected to the patient securely.  Do not push and hold the circle down as this turns the monitor battery off. The cell phone will locate the monitor battery. A screen will appear on the cell phone checking the connection of your monitor strip. This may read poor connection initially but change to good connection within the next minute. Once your monitor accepts the connection you will hear a series of 3 beeps followed by a climbing crescendo of beeps. A screen will appear on the cell phone showing the two monitor strip placement options. Touch the picture that demonstrates where you applied the monitor strip.  Your monitor strip and battery are waterproof. You are able to shower, bathe, or swim with the monitor on. They just ask you do not submerge deeper than 3 feet underwater. We recommend removing the monitor if you are swimming in a lake, river, or ocean.  Your monitor battery will need to be switched to a fully charged monitor battery approximately once a week. The cell phone will alert you of an action which needs to be made.  On the cell phone, tap for details to reveal connection status, monitor battery status, and cell phone battery status. The green dots indicates your monitor is in good  status. A red dot indicates there is something that needs your attention.  To record a symptom, click the circle on the monitor battery. In 30-60 seconds a list of symptoms will appear on the cell phone. Select your symptom and tap save. Your monitor will record a sustained or significant arrhythmia regardless of you clicking the button. Some patients do not feel the heart rhythm irregularities. [employer] will notify us of any serious or critical events.  Refer to instruction booklet for instructions on switching batteries, changing strips, the Do not disturb or Pause features, or any additional questions.  Call [employer] at [phone], to confirm your monitor is transmitting and record your baseline. They will answer any  questions you may have regarding the monitor instructions at that time.  Returning the monitor to [employer]  Place all equipment back into blue box. Peel off strip of paper to expose adhesive and close box securely. There is a prepaid UPS shipping label on this box. Drop in a UPS drop box, or at a UPS facility like [employer]. You may also contact [employer] to arrange UPS to pick up monitor package at your home.

## 2023-06-21 NOTE — Telephone Encounter (Signed)
Spoke with patient regarding coming to PT, discussed that he would be better served elsewhere where he could be seen more often, he will try [facility] where he was before coming here and wants to return to [place] in February.

## 2023-06-27 ENCOUNTER — Encounter: Payer: Self-pay | Admitting: Physical Therapy

## 2023-06-29 ENCOUNTER — Ambulatory Visit: Payer: Commercial Managed Care - PPO | Admitting: Dietician

## 2023-07-14 ENCOUNTER — Inpatient Hospital Stay: Payer: Commercial Managed Care - PPO | Attending: Hematology & Oncology

## 2023-07-14 ENCOUNTER — Inpatient Hospital Stay (HOSPITAL_BASED_OUTPATIENT_CLINIC_OR_DEPARTMENT_OTHER): Payer: Commercial Managed Care - PPO | Admitting: Hematology & Oncology

## 2023-07-14 ENCOUNTER — Encounter: Payer: Self-pay | Admitting: Hematology & Oncology

## 2023-07-14 VITALS — BP 113/71 | HR 89 | Temp 97.7°F | Resp 20 | Ht 68.5 in | Wt 168.1 lb

## 2023-07-14 DIAGNOSIS — G629 Polyneuropathy, unspecified: Secondary | ICD-10-CM | POA: Insufficient documentation

## 2023-07-14 DIAGNOSIS — E859 Amyloidosis, unspecified: Secondary | ICD-10-CM | POA: Insufficient documentation

## 2023-07-14 DIAGNOSIS — G59 Mononeuropathy in diseases classified elsewhere: Secondary | ICD-10-CM | POA: Diagnosis not present

## 2023-07-14 DIAGNOSIS — R161 Splenomegaly, not elsewhere classified: Secondary | ICD-10-CM

## 2023-07-14 LAB — CBC WITH DIFFERENTIAL (CANCER CENTER ONLY)
Abs Immature Granulocytes: 0.03 10*3/uL (ref 0.00–0.07)
Basophils Absolute: 0.1 10*3/uL (ref 0.0–0.1)
Basophils Relative: 1 %
Eosinophils Absolute: 0.1 10*3/uL (ref 0.0–0.5)
Eosinophils Relative: 1 %
HCT: 44 % (ref 39.0–52.0)
Hemoglobin: 14.5 g/dL (ref 13.0–17.0)
Immature Granulocytes: 1 %
Lymphocytes Relative: 26 %
Lymphs Abs: 1.6 10*3/uL (ref 0.7–4.0)
MCH: 28.3 pg (ref 26.0–34.0)
MCHC: 33 g/dL (ref 30.0–36.0)
MCV: 85.8 fL (ref 80.0–100.0)
Monocytes Absolute: 0.4 10*3/uL (ref 0.1–1.0)
Monocytes Relative: 6 %
Neutro Abs: 4.1 10*3/uL (ref 1.7–7.7)
Neutrophils Relative %: 65 %
Platelet Count: 283 10*3/uL (ref 150–400)
RBC: 5.13 MIL/uL (ref 4.22–5.81)
RDW: 12.6 % (ref 11.5–15.5)
WBC Count: 6.2 10*3/uL (ref 4.0–10.5)
nRBC: 0 % (ref 0.0–0.2)

## 2023-07-14 LAB — CMP (CANCER CENTER ONLY)
ALT: 28 U/L (ref 0–44)
AST: 20 U/L (ref 15–41)
Albumin: 4.4 g/dL (ref 3.5–5.0)
Alkaline Phosphatase: 71 U/L (ref 38–126)
Anion gap: 7 (ref 5–15)
BUN: 18 mg/dL (ref 6–20)
CO2: 28 mmol/L (ref 22–32)
Calcium: 9.5 mg/dL (ref 8.9–10.3)
Chloride: 105 mmol/L (ref 98–111)
Creatinine: 0.78 mg/dL (ref 0.61–1.24)
GFR, Estimated: >60 mL/min (ref >60)
Glucose, Bld: 105 mg/dL — ABNORMAL HIGH (ref 70–99)
Potassium: 4.2 mmol/L (ref 3.5–5.1)
Sodium: 140 mmol/L (ref 135–145)
Total Bilirubin: 0.5 mg/dL (ref <1.2)
Total Protein: 7 g/dL (ref 6.5–8.1)

## 2023-07-14 LAB — LACTATE DEHYDROGENASE: LDH: 139 U/L (ref 98–192)

## 2023-07-14 LAB — SEDIMENTATION RATE: Sed Rate: 9 mm/h (ref 0–16)

## 2023-07-14 NOTE — Progress Notes (Signed)
Hematology and Oncology Follow Up Visit  PRANJAL MAYHEW [id number] [birth year] 42 y.o. 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 Gerstman is back for second office visit.  We first saw him back in August.  At that time, he was referred to [place] think that there may be a hematologic issue that could explain all of his problems.  He has a lot of fatigue.  He has arthralgias.  It is felt that he has some kind of autoimmune issue.  He has neuropathy.  He has seen Neurology.  They actually have done biopsies of the his peripheral nerves.  From what Mr. Abraha says, the biopsies were negative.  We did do a SPEP on him.  This and it did not show a monoclonal spike.  He had normal immunoglobulin levels.  We did check his vitamin B12 level on him.  This was 420.  We did check a iron study on him.  The iron levels showed a ferritin of 82 with an iron saturation of 29%.  Only some, there is no anemia.  We did do an ultrasound of his spleen.  There was no splenomegaly.   He had a CT of the body that was done back in July which was relatively unremarkable.  He has had a rheumatologic studies done.  He does have a slightly elevated RA titer of 15.  We are to have him do a 24-hour urine today.  The only thing I can think of that might explain some of this is amyloidosis.  I would think though however, that a nerve biopsy would be able to see amyloid.  He does not have any renal insufficiency.  He has no physical symptoms of amyloidosis.  Currently, I would say that his performance status is ECOG 1.  Medications:  Current Outpatient Medications:    Alpha-Lipoic Acid 300 MG TABS, Take 600 mg by mouth daily., Disp: , Rfl:    Cholecalciferol (VITAMIN D-3) 1000 units CAPS, Take 1,000 Units by mouth daily., Disp: , Rfl:    hyoscyamine (LEVSIN SL) 0.125 MG SL tablet, Take 0.125 mg by mouth 4 (four) times daily as needed  for cramping., Disp: , Rfl:    Multiple Vitamin (MULTIVITAMIN) tablet, Take 1 tablet by mouth daily., Disp: , Rfl:    nortriptyline (PAMELOR) 25 MG capsule, Take 25 mg by mouth at bedtime., Disp: , Rfl:    vitamin B-12 (CYANOCOBALAMIN) 250 MCG tablet, Take 1,000 mcg by mouth daily., Disp: , Rfl:    colchicine 0.6 MG tablet, Take 0.6 mg by mouth daily., Disp: , Rfl:    pyridOXINE (VITAMIN B6) 100 MG tablet, Take 100 mg by mouth daily. (Patient not taking: Reported on 07/14/2023), Disp: , Rfl:   Allergies:  Allergies  Allergen Reactions   Bactrim [Sulfamethoxazole-Trimethoprim] Itching   Doxycycline Itching and Other (See Comments)    Itching in 2016 while on doxycycline and bactrim for facial abscess   Amoxicillin Diarrhea and Rash    Past Medical History, Surgical history, Social history, and Family History were reviewed and updated.  Review of Systems: Review of Systems  Constitutional:  Positive for fatigue.  HENT:  Negative.    Eyes: Negative.   Respiratory: Negative.    Cardiovascular: Negative.   Gastrointestinal: Negative.   Endocrine: Negative.   Genitourinary: Negative.    Musculoskeletal: Negative.   Neurological:  Positive for numbness.  Hematological: Negative.  Psychiatric/Behavioral: Negative.      Physical Exam:  height is 5' 8.5" (1.74 m) and weight is 168 lb 1.9 oz (76.3 kg). His oral temperature is 97.7 F (36.5 C). His blood pressure is 113/71 and his pulse is 89. His respiration is 20 and oxygen saturation is 100%.   Wt Readings from Last 3 Encounters:  07/14/23 168 lb 1.9 oz (76.3 kg)  06/21/23 165 lb (74.8 kg)  04/21/23 171 lb (77.6 kg)    Physical Exam Vitals reviewed.  HENT:     Head: Normocephalic and atraumatic.  Eyes:     Pupils: Pupils are equal, round, and reactive to light.  Cardiovascular:     Rate and Rhythm: Normal rate and regular rhythm.     Heart sounds: Normal heart sounds.  Pulmonary:     Effort: Pulmonary effort is normal.      Breath sounds: Normal breath sounds.  Abdominal:     General: Bowel sounds are normal.     Palpations: Abdomen is soft.  Musculoskeletal:        General: No tenderness or deformity. Normal range of motion.     Cervical back: Normal range of motion.  Lymphadenopathy:     Cervical: No cervical adenopathy.  Skin:    General: Skin is warm and dry.     Findings: No erythema or rash.  Neurological:     Mental Status: He is alert and oriented to person, place, and time.  Psychiatric:        Behavior: Behavior normal.        Thought Content: Thought content normal.        Judgment: Judgment normal.      Lab Results  Component Value Date   WBC 6.2 07/14/2023   HGB 14.5 07/14/2023   HCT 44.0 07/14/2023   MCV 85.8 07/14/2023   PLT 283 07/14/2023     Chemistry      Component Value Date/Time   NA 140 07/14/2023 0813   K 4.2 07/14/2023 0813   CL 105 07/14/2023 0813   CO2 28 07/14/2023 0813   BUN 18 07/14/2023 0813   CREATININE 0.78 07/14/2023 0813   CREATININE 0.86 04/28/2020 1250      Component Value Date/Time   CALCIUM 9.5 07/14/2023 0813   ALKPHOS 71 07/14/2023 0813   AST 20 07/14/2023 0813   ALT 28 07/14/2023 0813   BILITOT 0.5 07/14/2023 0813       Impression and Plan: Mr. Wickers is a very nice 43 year old male.  Again, I am not sure if going be able to find of a hematologic issue.  However, we will keep trying.  We will see what the 24-hour urine shows.  If there is an abnormal protein in the urine, he clearly will need to have a bone marrow biopsy done.  I did look at the blood smear under the microscope.  His red blood cells appear to unremarkable.  I did not see any nucleated red blood cells.  Denies any rouleaux formation.  White blood cell show no hypersegmented polys.  He had no immature myeloid or lymphoid forms.  He had no blasts.  Platelets are adequate in number and size.  Platelets are well granulated.  I subtly if this is some type of autoimmune issue.   I know these can sometimes be very complicated and very difficult to identify.  Once we get the 24-hour urine result back, we will then be in touch with him.   Josph Macho,  MD 12/19/20249:12 AM

## 2023-07-15 LAB — KAPPA/LAMBDA LIGHT CHAINS
Kappa free light chain: 11.3 mg/L (ref 3.3–19.4)
Kappa, lambda light chain ratio: 1.79 — ABNORMAL HIGH (ref 0.26–1.65)
Lambda free light chains: 6.3 mg/L (ref 5.7–26.3)

## 2023-07-15 LAB — IGG, IGA, IGM
IgA: 248 mg/dL (ref 90–386)
IgG (Immunoglobin G), Serum: 780 mg/dL (ref 603–1613)
IgM (Immunoglobulin M), Srm: 65 mg/dL (ref 20–172)

## 2023-07-20 LAB — PROTEIN ELECTROPHORESIS, SERUM, WITH REFLEX
A/G Ratio: 1.2 (ref 0.7–1.7)
Albumin ELP: 3.6 g/dL (ref 2.9–4.4)
Alpha-1-Globulin: 0.3 g/dL (ref 0.0–0.4)
Alpha-2-Globulin: 0.7 g/dL (ref 0.4–1.0)
Beta Globulin: 1.3 g/dL (ref 0.7–1.3)
Gamma Globulin: 0.7 g/dL (ref 0.4–1.8)
Globulin, Total: 2.9 g/dL (ref 2.2–3.9)
Total Protein ELP: 6.5 g/dL (ref 6.0–8.5)

## 2023-07-23 NOTE — Progress Notes (Deleted)
 CARDIOLOGY CONSULT NOTE       Patient ID: Joe Ray MRN: [id number] DOB/AGE: Jun 09, 1981 42 y.o.  Referring Physician: Dallas Schimke Primary Physician: Pearline Cables, MD Primary Cardiologist: Eden Emms Reason for Consultation: Tachycardia/Palpitations   HPI:  42 y.o. referred by Dr Patsy Lager for palpitations and tachycardia First seen by me on 06/21/23 Has not felt well since hospitalization in July with fever, rash myalgias and sepsis. He has some sort of small fiber neuropathy He has pre diabetes , iron deficiency anemia He had elevated inflammatory markers with CRP 20.8, ESR 28, TSH 4.8, RF 15 BC's, COVID, FLU and viral panel negative Was placed on colchicine at [facility]  [place] with no splenomegaly CT chest/abdomen some interstitial lung thickening   Works at [employer] [place] been in country for 30 years   ECG 01/27/23 showed ST rate 128 normal to me with no signs of pericarditis PR depression or ST elevation  July had rash with joint swelling in left knee and arms/hands Since then HR higher than normal   Moved from [place] age 61 not married Sister and parents in town Dad has CHF and sees DB.  He does logistics work for [employer]   Had ordered TTE and cardiac monitor but neither has been done yet  Seen by hematology with no monoclonal spike, normal B12 and no splenomegaly on CT Has had nerve biopsy negative for amyloid   Was hoping to look at TTE to see if there is a chance he has amyloid.   ***   ROS All other systems reviewed and negative except as noted above  Past Medical History:  Diagnosis Date   Allergy    Neuropathy     Family History  Problem Relation Age of Onset   Hyperlipidemia Mother    Diabetes Father    Congestive Heart Failure Father     Social History   Socioeconomic History   Marital status: Single    Spouse name: Not on file   Number of children: Not on file   Years of education: Not on file   Highest education level: Not on file  Occupational  History   Not on file  Tobacco Use   Smoking status: Never   Smokeless tobacco: Never  Vaping Use   Vaping status: Never Used  Substance and Sexual Activity   Alcohol use: Never   Drug use: Never   Sexual activity: Not Currently  Other Topics Concern   Not on file  Social History Narrative   Lives at home with family.  Works at [employer].  No children.   !/2 cup tea daily.    Social Drivers of Corporate investment banker Strain: Not on file  Food Insecurity: No Food Insecurity (04/22/2023)   Hunger Vital Sign    Worried About Running Out of Food in the Last Year: Never true    Ran Out of Food in the Last Year: Never true  Transportation Needs: No Transportation Needs (02/02/2023)   PRAPARE - Administrator, Civil Service (Medical): No    Lack of Transportation (Non-Medical): No  Physical Activity: Not on file  Stress: Not on file  Social Connections: Not on file  Intimate Partner Violence: Not At Risk (02/02/2023)   Humiliation, Afraid, Rape, and Kick questionnaire    Fear of Current or Ex-Partner: No    Emotionally Abused: No    Physically Abused: No    Sexually Abused: No    No past surgical history  on file.    Current Outpatient Medications:    Alpha-Lipoic Acid 300 MG TABS, Take 600 mg by mouth daily., Disp: , Rfl:    Cholecalciferol (VITAMIN D-3) 1000 units CAPS, Take 1,000 Units by mouth daily., Disp: , Rfl:    colchicine 0.6 MG tablet, Take 0.6 mg by mouth daily., Disp: , Rfl:    hyoscyamine (LEVSIN SL) 0.125 MG SL tablet, Take 0.125 mg by mouth 4 (four) times daily as needed for cramping., Disp: , Rfl:    Multiple Vitamin (MULTIVITAMIN) tablet, Take 1 tablet by mouth daily., Disp: , Rfl:    nortriptyline (PAMELOR) 25 MG capsule, Take 25 mg by mouth at bedtime., Disp: , Rfl:    pyridOXINE (VITAMIN B6) 100 MG tablet, Take 100 mg by mouth daily. (Patient not taking: Reported on 07/14/2023), Disp: , Rfl:    vitamin B-12 (CYANOCOBALAMIN) 250 MCG tablet,  Take 1,000 mcg by mouth daily., Disp: , Rfl:     Physical Exam: There were no vitals taken for this visit.    Affect appropriate Healthy:  appears stated age HEENT: normal Neck supple with no adenopathy JVP normal no bruits no thyromegaly Lungs clear with no wheezing and good diaphragmatic motion Heart:  S1/S2 no murmur, no rub, gallop or click PMI normal Abdomen: benighn, BS positve, no tenderness, no AAA no bruit.  No HSM or HJR Distal pulses intact with no bruits No edema Neuro non-focal Skin warm and dry No muscular weakness   Labs:   Lab Results  Component Value Date   WBC 6.2 07/14/2023   HGB 14.5 07/14/2023   HCT 44.0 07/14/2023   MCV 85.8 07/14/2023   PLT 283 07/14/2023   No results for input(s): "NA", "K", "CL", "CO2", "BUN", "CREATININE", "CALCIUM", "PROT", "BILITOT", "ALKPHOS", "ALT", "AST", "GLUCOSE" in the last 168 hours.  Invalid input(s): "LABALBU" Lab Results  Component Value Date   CKTOTAL 59 01/31/2023    Lab Results  Component Value Date   CHOL 191 06/24/2021   CHOL 198 09/27/2018   Lab Results  Component Value Date   HDL 44.10 06/24/2021   HDL 43.50 09/27/2018   Lab Results  Component Value Date   LDLCALC 114 (H) 06/24/2021   LDLCALC 124 (H) 09/27/2018   Lab Results  Component Value Date   TRIG 164.0 (H) 06/24/2021   TRIG 152.0 (H) 09/27/2018   Lab Results  Component Value Date   CHOLHDL 4 06/24/2021   CHOLHDL 5 09/27/2018   No results found for: "LDLDIRECT"    Radiology: No results found.  EKG: SR rate 84 LVH   ASSESSMENT AND PLAN:   Tachcyardia:  in setting of undiagnosed inflammatory condition will order TTE to assess EF and r/o SBE as well. 30 day monitor to assess HR variability and arrhythmia ECG with normal intervals but voltage for LVH  Rheum/ID:  elevated inflammatory markers Has had trial of colchicine needs ? Nerve biopsy Immunoglobulin labs pending from hematology Neuropathy:  with ? Rheumatologic dx myalgias  ? Would trial of steroids be helpful Gabapentin not helpful Pamelor increased repeat MRI pending then decision on need for LP  30 day monitor TTE  F/U 6-8 weeks  Signed: Charlton Haws 07/23/2023, 4:12 PM

## 2023-07-25 ENCOUNTER — Ambulatory Visit (HOSPITAL_COMMUNITY)
Admission: RE | Admit: 2023-07-25 | Payer: Commercial Managed Care - PPO | Source: Ambulatory Visit | Attending: Cardiovascular Disease | Admitting: Cardiovascular Disease

## 2023-07-26 ENCOUNTER — Ambulatory Visit (HOSPITAL_COMMUNITY)
Admission: RE | Admit: 2023-07-26 | Discharge: 2023-07-26 | Disposition: A | Discharge status: Home / Self Care | Payer: Commercial Managed Care - PPO | Source: Ambulatory Visit | Attending: Cardiovascular Disease | Admitting: Cardiovascular Disease

## 2023-07-26 DIAGNOSIS — R002 Palpitations: Secondary | ICD-10-CM | POA: Diagnosis not present

## 2023-07-26 DIAGNOSIS — I479 Paroxysmal tachycardia, unspecified: Secondary | ICD-10-CM | POA: Diagnosis not present

## 2023-07-26 LAB — ECHOCARDIOGRAM COMPLETE
AR max vel: 3.22 cm2
AV Area VTI: 2.6 cm2
AV Area mean vel: 3.15 cm2
AV Mean grad: 1 mm[Hg]
AV Peak grad: 2.5 mm[Hg]
AV Vena cont: 0.13 cm
Ao pk vel: 0.8 m/s
Area-P 1/2: 3.63 cm2
MV M vel: 0.86 m/s
MV Peak grad: 3 mm[Hg]
P 1/2 time: 1199 ms
S' Lateral: 2.63 cm

## 2023-08-01 ENCOUNTER — Inpatient Hospital Stay: Payer: Commercial Managed Care - PPO | Attending: Hematology & Oncology

## 2023-08-01 DIAGNOSIS — G629 Polyneuropathy, unspecified: Secondary | ICD-10-CM | POA: Insufficient documentation

## 2023-08-01 DIAGNOSIS — E859 Amyloidosis, unspecified: Secondary | ICD-10-CM | POA: Diagnosis present

## 2023-08-01 DIAGNOSIS — R161 Splenomegaly, not elsewhere classified: Secondary | ICD-10-CM

## 2023-08-03 ENCOUNTER — Ambulatory Visit: Payer: Commercial Managed Care - PPO | Admitting: Cardiovascular Disease

## 2023-08-04 LAB — UPEP/UIFE/LIGHT CHAINS/TP, 24-HR UR
% BETA, Urine: 34.2 %
ALPHA 1 URINE: 4 %
Albumin, U: 27.5 %
Alpha 2, Urine: 18.4 %
Free Kappa Lt Chains,Ur: 8.89 mg/L (ref 1.17–86.46)
Free Kappa/Lambda Ratio: 6.49 (ref 1.83–14.26)
Free Lambda Lt Chains,Ur: 1.37 mg/L (ref 0.27–15.21)
GAMMA GLOBULIN URINE: 16 %
Total Protein, Urine-Ur/day: 65 mg/(24.h) (ref 30–150)
Total Protein, Urine: 7.7 mg/dL
Total Volume: 850

## 2023-08-10 ENCOUNTER — Ambulatory Visit: Payer: Commercial Managed Care - PPO | Admitting: Dietician

## 2023-08-17 ENCOUNTER — Ambulatory Visit: Payer: Commercial Managed Care - PPO | Admitting: Dietician

## 2023-09-01 ENCOUNTER — Ambulatory Visit: Payer: Commercial Managed Care - PPO | Admitting: Physical Therapy

## 2023-09-16 NOTE — Progress Notes (Signed)
 CARDIOLOGY CONSULT NOTE       Patient ID: Joe Ray MRN: [id number] DOB/AGE: 1981/02/27 43 y.o.  Referring Physician: Dallas Schimke Primary Physician: Pearline Cables, MD Primary Cardiologist: Eden Emms Reason for Consultation: Tachycardia/Palpitations    HPI:  43 y.o. referred by Dr Patsy Lager for palpitations and tachycardia First seen by me 06/21/23  Has not felt well since hospitalization in July2024 with fever, rash myalgias and sepsis. He has some sort of small fiber neuropathy He has pre diabetes , iron deficiency anemia He had elevated inflammatory markers with CRP 20.8, ESR 28, TSH 4.8, RF 15 BC's, COVID, FLU and viral panel negative Was placed on colchicine at [facility]  [place] with no splenomegaly CT chest/abdomen some interstitial lung thickening   Works at [employer] [place] been in country for 30 years   ECG 01/27/23 showed ST rate 128 normal to me with no signs of pericarditis PR depression or ST elevation  July had rash with joint swelling in left knee and arms/hands Since then HR higher than normal Nerve biopsies negative No amyloid. Seen by Dr Myna Hidalgo in hematology SPEP with no monoclonal spike normal immunoglobulin levels. B12 420 Ferritin not elevated [place] no splenomegaly. RA titer mildly elevated 15  Moved from [place] age 55 not married Sister and parents in town Dad has CHF and sees DB.  He does logistics work for [employer]  TTE 07/26/23 reviewed EF 60-65% normal RV normal valves aortic root 3.9 cm no effusion Monitor ordered 06/21/23 never done   ***      ROS All other systems reviewed and negative except as noted above  Past Medical History:  Diagnosis Date   Allergy    Neuropathy     Family History  Problem Relation Age of Onset   Hyperlipidemia Mother    Diabetes Father    Congestive Heart Failure Father     Social History   Socioeconomic History   Marital status: Single    Spouse name: Not on file   Number of children: Not on file   Years of education:  Not on file   Highest education level: Not on file  Occupational History   Not on file  Tobacco Use   Smoking status: Never   Smokeless tobacco: Never  Vaping Use   Vaping status: Never Used  Substance and Sexual Activity   Alcohol use: Never   Drug use: Never   Sexual activity: Not Currently  Other Topics Concern   Not on file  Social History Narrative   Lives at home with family.  Works at [employer].  No children.   !/2 cup tea daily.    Social Drivers of Corporate investment banker Strain: Not on file  Food Insecurity: No Food Insecurity (04/22/2023)   Hunger Vital Sign    Worried About Running Out of Food in the Last Year: Never true    Ran Out of Food in the Last Year: Never true  Transportation Needs: No Transportation Needs (02/02/2023)   PRAPARE - Administrator, Civil Service (Medical): No    Lack of Transportation (Non-Medical): No  Physical Activity: Not on file  Stress: Not on file  Social Connections: Not on file  Intimate Partner Violence: Not At Risk (02/02/2023)   Humiliation, Afraid, Rape, and Kick questionnaire    Fear of Current or Ex-Partner: No    Emotionally Abused: No    Physically Abused: No    Sexually Abused: No    No past  surgical history on file.    Current Outpatient Medications:    Alpha-Lipoic Acid 300 MG TABS, Take 600 mg by mouth daily., Disp: , Rfl:    Cholecalciferol (VITAMIN D-3) 1000 units CAPS, Take 1,000 Units by mouth daily., Disp: , Rfl:    colchicine 0.6 MG tablet, Take 0.6 mg by mouth daily., Disp: , Rfl:    hyoscyamine (LEVSIN SL) 0.125 MG SL tablet, Take 0.125 mg by mouth 4 (four) times daily as needed for cramping., Disp: , Rfl:    Multiple Vitamin (MULTIVITAMIN) tablet, Take 1 tablet by mouth daily., Disp: , Rfl:    nortriptyline (PAMELOR) 25 MG capsule, Take 25 mg by mouth at bedtime., Disp: , Rfl:    pyridOXINE (VITAMIN B6) 100 MG tablet, Take 100 mg by mouth daily. (Patient not taking: Reported on  07/14/2023), Disp: , Rfl:    vitamin B-12 (CYANOCOBALAMIN) 250 MCG tablet, Take 1,000 mcg by mouth daily., Disp: , Rfl:     Physical Exam: There were no vitals taken for this visit.    Affect appropriate Healthy:  appears stated age HEENT: normal Neck supple with no adenopathy JVP normal no bruits no thyromegaly Lungs clear with no wheezing and good diaphragmatic motion Heart:  S1/S2 no murmur, no rub, gallop or click PMI normal Abdomen: benighn, BS positve, no tenderness, no AAA no bruit.  No HSM or HJR Distal pulses intact with no bruits No edema Neuro non-focal Skin warm and dry No muscular weakness   Labs:   Lab Results  Component Value Date   WBC 6.2 07/14/2023   HGB 14.5 07/14/2023   HCT 44.0 07/14/2023   MCV 85.8 07/14/2023   PLT 283 07/14/2023   No results for input(s): "NA", "K", "CL", "CO2", "BUN", "CREATININE", "CALCIUM", "PROT", "BILITOT", "ALKPHOS", "ALT", "AST", "GLUCOSE" in the last 168 hours.  Invalid input(s): "LABALBU" Lab Results  Component Value Date   CKTOTAL 59 01/31/2023    Lab Results  Component Value Date   CHOL 191 06/24/2021   CHOL 198 09/27/2018   Lab Results  Component Value Date   HDL 44.10 06/24/2021   HDL 43.50 09/27/2018   Lab Results  Component Value Date   LDLCALC 114 (H) 06/24/2021   LDLCALC 124 (H) 09/27/2018   Lab Results  Component Value Date   TRIG 164.0 (H) 06/24/2021   TRIG 152.0 (H) 09/27/2018   Lab Results  Component Value Date   CHOLHDL 4 06/24/2021   CHOLHDL 5 09/27/2018   No results found for: "LDLDIRECT"    Radiology: No results found.  EKG: SR rate 84 LVH   ASSESSMENT AND PLAN:   Tachcyardia:  in setting of undiagnosed inflammatory condition TTE with normal EF, valves and no effusion *** Rheum/ID:  elevated inflammatory markers Has had trial of colchicine nerve biopsy negative including amyloid Seeing [facility] no monoclonal spike *** Neuropathy:  with ? Rheumatologic dx myalgias ? Would trial  of steroids be helpful Gabapentin not helpful Pamelor increased repeat MRI pending then decision on need for LP  30 day monitor ***  F/U 6 months   Signed: Charlton Haws 09/16/2023, 5:03 PM

## 2023-09-23 ENCOUNTER — Ambulatory Visit (INDEPENDENT_AMBULATORY_CARE_PROVIDER_SITE_OTHER): Payer: Commercial Managed Care - PPO | Admitting: Cardiovascular Disease

## 2023-09-23 DIAGNOSIS — R002 Palpitations: Secondary | ICD-10-CM

## 2023-09-23 DIAGNOSIS — I479 Paroxysmal tachycardia, unspecified: Secondary | ICD-10-CM

## 2023-10-17 ENCOUNTER — Encounter: Payer: Self-pay | Admitting: Cardiovascular Disease
